# Patient Record
Sex: Male | Born: 1945 | Race: Black or African American | Hispanic: No | Marital: Married | Smoking: Never smoker
Health system: Southern US, Community
[De-identification: ages and names within clinical notes are randomized; demographics above are authoritative.]

## PROBLEM LIST (undated history)

## (undated) DIAGNOSIS — G4730 Sleep apnea, unspecified: Secondary | ICD-10-CM

## (undated) DIAGNOSIS — H409 Unspecified glaucoma: Secondary | ICD-10-CM

## (undated) DIAGNOSIS — E119 Type 2 diabetes mellitus without complications: Secondary | ICD-10-CM

## (undated) DIAGNOSIS — K219 Gastro-esophageal reflux disease without esophagitis: Secondary | ICD-10-CM

## (undated) DIAGNOSIS — R0602 Shortness of breath: Secondary | ICD-10-CM

## (undated) DIAGNOSIS — Z9989 Dependence on other enabling machines and devices: Secondary | ICD-10-CM

## (undated) DIAGNOSIS — E785 Hyperlipidemia, unspecified: Secondary | ICD-10-CM

## (undated) DIAGNOSIS — M79622 Pain in left upper arm: Secondary | ICD-10-CM

## (undated) DIAGNOSIS — T884XXA Failed or difficult intubation, initial encounter: Secondary | ICD-10-CM

## (undated) DIAGNOSIS — I10 Essential (primary) hypertension: Secondary | ICD-10-CM

## (undated) DIAGNOSIS — E669 Obesity, unspecified: Secondary | ICD-10-CM

## (undated) DIAGNOSIS — G709 Myoneural disorder, unspecified: Secondary | ICD-10-CM

## (undated) DIAGNOSIS — N529 Male erectile dysfunction, unspecified: Secondary | ICD-10-CM

## (undated) DIAGNOSIS — N189 Chronic kidney disease, unspecified: Secondary | ICD-10-CM

## (undated) DIAGNOSIS — E11319 Type 2 diabetes mellitus with unspecified diabetic retinopathy without macular edema: Secondary | ICD-10-CM

## (undated) DIAGNOSIS — G4733 Obstructive sleep apnea (adult) (pediatric): Secondary | ICD-10-CM

## (undated) DIAGNOSIS — M1990 Unspecified osteoarthritis, unspecified site: Secondary | ICD-10-CM

## (undated) DIAGNOSIS — J984 Other disorders of lung: Secondary | ICD-10-CM

## (undated) DIAGNOSIS — I509 Heart failure, unspecified: Secondary | ICD-10-CM

## (undated) DIAGNOSIS — H269 Unspecified cataract: Secondary | ICD-10-CM

## (undated) DIAGNOSIS — H903 Sensorineural hearing loss, bilateral: Secondary | ICD-10-CM

## (undated) HISTORY — DX: Unspecified glaucoma: H40.9

## (undated) HISTORY — DX: Chronic kidney disease, unspecified: N18.9

## (undated) HISTORY — DX: Unspecified cataract: H26.9

## (undated) HISTORY — DX: Essential (primary) hypertension: I10

## (undated) HISTORY — DX: Shortness of breath: R06.02

## (undated) HISTORY — DX: Sensorineural hearing loss, bilateral: H90.3

## (undated) HISTORY — DX: Sleep apnea, unspecified: G47.30

## (undated) HISTORY — DX: Type 2 diabetes mellitus without complications: E11.9

## (undated) HISTORY — DX: Pain in left upper arm: M79.622

## (undated) HISTORY — DX: Failed or difficult intubation, initial encounter: T88.4XXA

## (undated) HISTORY — PX: COLONOSCOPY: SHX174

## (undated) HISTORY — PX: GLAUCOMA SURGERY: SHX656

## (undated) HISTORY — DX: Male erectile dysfunction, unspecified: N52.9

## (undated) HISTORY — DX: Other disorders of lung: J98.4

## (undated) HISTORY — PX: JOINT REPLACEMENT: SHX530

## (undated) HISTORY — DX: Gastro-esophageal reflux disease without esophagitis: K21.9

## (undated) HISTORY — DX: Obesity, unspecified: E66.9

## (undated) HISTORY — DX: Hyperlipidemia, unspecified: E78.5

## (undated) HISTORY — DX: Type 2 diabetes mellitus with unspecified diabetic retinopathy without macular edema: E11.319

---

## 1998-11-10 ENCOUNTER — Inpatient Hospital Stay (HOSPITAL_COMMUNITY): Admission: EM | Admit: 1998-11-10 | Discharge: 1998-11-12 | Payer: Self-pay | Admitting: Emergency Medicine

## 1998-11-10 ENCOUNTER — Encounter: Payer: Self-pay | Admitting: Emergency Medicine

## 1998-11-11 ENCOUNTER — Encounter: Payer: Self-pay | Admitting: Emergency Medicine

## 1998-11-12 ENCOUNTER — Encounter: Payer: Self-pay | Admitting: Emergency Medicine

## 2000-01-05 ENCOUNTER — Encounter: Payer: Self-pay | Admitting: Emergency Medicine

## 2000-01-05 ENCOUNTER — Emergency Department (HOSPITAL_COMMUNITY): Admission: EM | Admit: 2000-01-05 | Discharge: 2000-01-05 | Payer: Self-pay | Admitting: Emergency Medicine

## 2000-03-14 ENCOUNTER — Encounter: Payer: Self-pay | Admitting: Emergency Medicine

## 2000-03-14 ENCOUNTER — Encounter: Admission: RE | Admit: 2000-03-14 | Discharge: 2000-03-14 | Payer: Self-pay | Admitting: Emergency Medicine

## 2002-01-09 ENCOUNTER — Inpatient Hospital Stay (HOSPITAL_COMMUNITY): Admission: EM | Admit: 2002-01-09 | Discharge: 2002-01-16 | Payer: Self-pay | Admitting: Emergency Medicine

## 2002-01-09 ENCOUNTER — Encounter: Payer: Self-pay | Admitting: Emergency Medicine

## 2002-01-10 ENCOUNTER — Encounter: Payer: Self-pay | Admitting: Gastroenterology

## 2002-01-10 ENCOUNTER — Encounter: Payer: Self-pay | Admitting: Emergency Medicine

## 2002-01-11 ENCOUNTER — Encounter: Payer: Self-pay | Admitting: Emergency Medicine

## 2002-01-15 ENCOUNTER — Encounter: Payer: Self-pay | Admitting: Gastroenterology

## 2002-05-23 ENCOUNTER — Encounter: Admission: RE | Admit: 2002-05-23 | Discharge: 2002-05-23 | Payer: Self-pay | Admitting: Emergency Medicine

## 2002-05-23 ENCOUNTER — Encounter: Payer: Self-pay | Admitting: Emergency Medicine

## 2002-07-17 ENCOUNTER — Ambulatory Visit (HOSPITAL_BASED_OUTPATIENT_CLINIC_OR_DEPARTMENT_OTHER): Admission: RE | Admit: 2002-07-17 | Discharge: 2002-07-17 | Payer: Self-pay | Admitting: Emergency Medicine

## 2003-02-19 ENCOUNTER — Encounter: Admission: RE | Admit: 2003-02-19 | Discharge: 2003-02-19 | Payer: Self-pay | Admitting: Emergency Medicine

## 2003-02-19 ENCOUNTER — Encounter: Payer: Self-pay | Admitting: Emergency Medicine

## 2008-03-24 ENCOUNTER — Encounter: Admission: RE | Admit: 2008-03-24 | Discharge: 2008-03-24 | Payer: Self-pay | Admitting: Family Medicine

## 2010-12-17 NOTE — H&P (Signed)
[place]. [facility]  Patient:    Micheal Morris, Micheal Morris Visit Number: [id number] MRN: [id number]          Service Type: MED Location: [id number] Attending Physician:  Roque Lias Dictated by:   Reuben Likes, M.D. Admit Date:  01/09/2002                           History and Physical  CHIEF COMPLAINT:  Nausea and vomiting.  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  Thereafter, the patient felt nauseated.  He vomited several times, clear vomitus, followed by coffee ground emesis thereafter.  There was no bright red blood in the vomitus.  This continued all night and into today. The patients wife drove him back to [place] and straight to the emergency room.  The patient also notes chest pain after vomiting and epigastric pain.  He felt chilled and feverish.  He had some sweats and was very weak.  He did not have any diarrhea or melanotic stools.  CBGs in the past week have ranged from 100-140.  The patient had a similar episode while in [place], [place], on October 12, 2001, after eating some pizza and a fish sandwich.  He was hospital at [facility] in [place], [place], and had endoscopy there which only showed a Mallory-Weiss tear.  While there he also did have a gallbladder ultrasound which was negative.  PAST SURGICAL HISTORY:  None.  PAST MEDICAL HISTORY: 1. He was hospitalized in April of 2000 with a similar episode of abdominal    pain, nausea, and vomiting and work-up at that time was negative. 2. He has had type 2 diabetes since 1997. 3. He has had hypertension. 4. He has had hyperlipidemia.  MEDICATIONS: 1. Lotensin 40 mg once a day. 2. Glucotrol XL 10 mg two per day. 3. Glucophage 850 mg t.i.d. 4. Actos 45 mg once a day. 5. HCTZ 25 mg once a day. 6. Lantus Insulin 22 units per day. 7.  Norvasc 5 mg per day. 8. Aspirin 81 mg per day.  ALLERGIES:  None.  FAMILY HISTORY:  His father died in an auto accident.  His mother died at age 47 of CVAs.  She also had diabetes and hypertension.  He has three brother, one has osteoarthritis and diabetes, and six sisters, two have diabetes.  He has two sons who are in good health.  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  He is married.  He has never used tobacco. He drinks socially.  REVIEW OF SYSTEMS:  No systemic skin, eye, ENT, respiratory, cardiovascular, GI, GU, musculoskeletal, or neurological complaints.  PHYSICAL EXAMINATION:  Blood pressure 186/92, pulse 117 and regular, respirations 20, temperature 97.9 degrees, O2 saturation 98%.  GENERAL APPEARANCE:  He is ill appearing, but in no respiratory distress.  SKIN:  Warm and dry.  Good turgor.  No rash.  HEENT:  Eyes:  Pupils small.  The fundi are not well seen.  Full EOMs.  The sclerae were nonicteric.  Ears:  The TMs were normal.  Throat:  No intraoral lesions.  The pharynx was clear.  The mucous membranes were moist.  NECK:  Supple.  No adenopathy, JVD, or bruit.  The thyroid was normal.  LUNGS:  Clear to auscultation.  HEART:  Regular rhythm.  No gallop or murmur.  ABDOMEN:  There is slight epigastric tenderness without guarding or rebound. No organomegaly or mass.  Bowel sounds were normally active.  Murphys sign and Murphys punch were negative.  RECTAL:  Stool was heme negative.  The prostate was normal.  EXTREMITIES:  No pedal edema.  The peripheral pulses were full.  NEUROLOGIC:  Alert and oriented x 3.  Speech was clear and appropriate.  No extremity weakness or tremor.  DTRs 2+ and symmetrical.  Babinskis downgoing. Cranial nerves were intact.  LABORATORY DATA:  The CBC revealed a hemoglobin of 14.1 and a white count of 12,000.  The CMET showed a sodium of 138, potassium 4.2, glucose  315, BUN 15, creatinine 1.1, SGOT 28, SGPT 22, alkaline phosphatase 108, and bilirubin 0.8. The amylase was 66 and lipase was 21.  The total CPK was 163, the MB was 2.0, and the troponin was 0.01.  ADMITTING IMPRESSION: 1. Nausea and vomiting, possibly gastroenteritis, gastritis, ulcer, or    gallstones. 2. Upper gastrointestinal bleed, probably Mallory-Weiss tear. 3. Type 2 diabetes. 4. Hypertension. 5. Hyperlipidemia.  PLAN: 1. IV fluids. 2. IV Protonix. 3. GI consult. Dictated by:   Reuben Likes, M.D. Attending Physician:  Roque Lias DD:  01/09/02 TD:  01/11/02 Job: [id number]

## 2010-12-17 NOTE — Discharge Summary (Signed)
[place]. [facility]  Patient:    Micheal Morris, Micheal Morris Visit Number: [id number] MRN: [id number]          Service Type: MED Location: [id number] Attending Physician:  Roque Lias Dictated by:   Reuben Likes, M.D. 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 Thereafter the patient felt nauseated. He vomited several times, clear vomitus, followed by coffeeground emesis thereafter. There was no bright red blood in the vomitus.  This continued all night and into today. The patients wife drove him back to [place] and straight to the emergency room. The patient also notes some chest pain after vomiting and epigastric pain. He felt chills and feverish. He had some sweats and was very weak. He did not have any diarrhea or melanotic stools. His CBGs in the last week have ranged from 100 to 140.  The patient had a similar episode while in [place], [place] on October 12, 2001, after eating pizza and a fish sandwich. He was hospitalized at [facility] in [place], [place], and had an endoscopy there which only showed a Mallory-Weiss tear. While there he also had a gallbladder ultrasound which was negative.  PHYSICAL EXAMINATION:  VITAL SIGNS:  Blood pressure 186/92, pulse 117 and regular, respiratory rate 20, temperature 97.9, oxygen saturations 98%.  GENERAL:  He appears ill and uncomfortable but in no respiratory distress.  SKIN:  Warm and dry with good turgor and no skin rash.  HEENT:  Pupils are small, the fundi were not well seen. Extraocular motions were full. Sclerae anicteric. There were no intraoral lesions. The pharynx was clear. Moist mucous membranes.  NECK:  Supple, no cervical adenopathy. The thyroid was  normal.  LUNGS:  Clear.  CARDIAC:  Rhythm was regular without gallop or murmur.  ABDOMEN:  There was slight epigastric tenderness to palpation without rebound or guarding. There was no organomegaly or masses. Normoactive bowel sounds. Muphys sign and Murphys punch were negative.  RECTUM:  Digital rectal examination revealed no masses, normal prostate, heme negative stool.  EXTREMITIES:  No pedal edema. Peripheral pulses were full.  NEUROLOGIC:  Examination within normal limits.  ADMITTING IMPRESSION: 1. Nausea and vomiting, possibly gastroenteritis, gastritis, ulcer or gallstones. 2. Upper gastrointestinal bleed, probably Mallory-Weiss tear. 3. Type 2 diabetes mellitus. 4. Hypertension. 5. Hyperlipidemia.  LABORATORY DATA:  An EKG on admission revealed normal sinus rhythm, nonspecific T-wave abnormalities. Otherwise was unremarkable, that was a followup tracing on January 11, 2002. On January 09, 2002, he had sinus tachycardia, nonspecific T-wave abnormalities and minimal ST elevation in the lateral leads.  A CT scan of the abdomen and pelvis was unremarkable. There was mild diverticular change but no diverticulitis or diverticulosis. No obvious cause for the nausea and vomiting or GI bleeding was seen. A chest x-ray showed mild cardiomegaly and an acute abdominal series revealed constipation but no obstruction or free air. An abdominal ultrasound showed no gallstones, otherwise was negative. Hepatobiliary scan was unremarkable. A gastric emptying study with normal.  HOSPITAL COURSE:  The patient was admitted to the intensive care unit and monitored by telemetry. Vital signs were obtained every 2 hours for 12 hours  and every 4 hours. He was put at bed rest and made n.p.o. except for ice chips, given IV normal saline at 250 cc an hour. His CBGs were obtained every 6 hours. He was given sliding-scale insulin and IV Protonix, morphine and Phenergan. He was seen in consultation by  Dr. Tasia Catchings.  His nausea and abdominal pain continued for the next several days. He complained of waves of nausea and periumbilical pain. He continued to vomit several times. An endoscopy showed a little mild gastritis but otherwise was negative. His blood pressure over the next several days was up and down. Phenergan failed to control his nausea and he was given ondansetron. Thereafter he felt a little bit better.  As of January 15, 2002, he was not as nauseated. He had kept 100% of his supper down and had no abdominal pain. It was the feeling of Dr. Sherin Quarry that his nausea and vomiting were due to a combination of gastritis or gastroenteritis, plus diabetic gastroparesis.  On January 16, 2002, he felt much better. He kept all of his medications down. He had no nausea or abdominal pain. His lungs were clear. His cardiac rhythm was regular without murmur. His abdomen was soft and nontender. Bowel sounds were normal. His gastric emptying studies were normal. It was felt that he could be discharged on that date.  FINAL DIAGNOSES: 1. Diabetic gastroparesis. 2. Gastritis. 3. Reflux esophagitis. 4. Hypertension. 5. Hyperlipidemia. 6. Diabetes.  DISCHARGE MEDICATIONS: 1. Metoclopramide 10 mg q.i.d., 30 minutes a.c. and q.h.s. 2. E-Mycin 333 t.i.d. 3. Omeprazole 20 mg q.d. All of these things have been started in the hospital. He was also continued on all of his other medications including: 1. Lotensin 40 mg q.d. 2. Glucotrol XL 10 mg 2 q.d. 3. Glucophage 850 mg t.i.d. which is to start later on this week. 4. Actos 45 mg q.d. 5. Hydrochlorothiazide 25 mg q.d. 6. Lantus insulin 22 units q.h.s. 7. Norvasc 5 mg q.d. 8. Aspirin 81 mg q.d.  DISCHARGE INSTRUCTIONS:  Activity, he may return to work on the following Monday. Diet, low fat diabetic diet. Other instructions, he should check blood sugars four times a day.  FOLLOWUP:  He is to see me in a week. Dictated by:   Reuben Likes, M.D. Attending Physician:  Roque Lias DD:  02/03/02  TD:  02/05/02 Job: [id number]

## 2011-02-21 ENCOUNTER — Encounter: Payer: Self-pay | Admitting: Podiatry

## 2011-02-21 DIAGNOSIS — E119 Type 2 diabetes mellitus without complications: Secondary | ICD-10-CM | POA: Insufficient documentation

## 2011-09-02 ENCOUNTER — Encounter: Payer: Self-pay | Admitting: Pulmonary Disease

## 2011-09-05 ENCOUNTER — Encounter: Payer: Self-pay | Admitting: Pulmonary Disease

## 2011-09-05 ENCOUNTER — Ambulatory Visit (INDEPENDENT_AMBULATORY_CARE_PROVIDER_SITE_OTHER): Payer: BC Managed Care – PPO | Admitting: Pulmonary Disease

## 2011-09-05 VITALS — BP 136/72 | HR 86 | Temp 97.9°F | Ht 71.0 in | Wt 261.6 lb

## 2011-09-05 DIAGNOSIS — G4733 Obstructive sleep apnea (adult) (pediatric): Secondary | ICD-10-CM | POA: Insufficient documentation

## 2011-09-05 NOTE — Patient Instructions (Signed)
Will get you a new cpap machine and mask, and take this opportunity to re-optimize your pressure.  Will call you once I get your download, and let you know your optimal pressure. Work on weight loss If doing well, followup with me in 6mos, then if doing well, will see you yearly .

## 2011-09-05 NOTE — Assessment & Plan Note (Signed)
The patient has a history of sleep apnea, and has been on CPAP for many years.  Unfortunately he has an aged machine and CPAP mask, and from his history is having breakthrough events.  This is leading to non-restorative sleep and significant daytime sleepiness.  At this point, we will need to get him a new CPAP machine and mask, and optimize his pressure again on the automatic setting for the first few weeks.  I have also encouraged him to work aggressively on weight loss.

## 2011-09-05 NOTE — Progress Notes (Signed)
  Subjective:    Patient ID: Micheal Morris, male    DOB: 08-06-1945, 66 y.o.   MRN: [id number]  HPI The patient is a 66 year old male who I have been asked to see for management of obstructive sleep apnea.  He was apparently diagnosed with severe sleep apnea many years ago, and was seen in this practice.  He has been lost to followup, but has continued to wear CPAP on a compliant basis.  Unfortunately, his machine is very old, and he feels the pressure is not consistent on a night to night basis.  He also has a mask that is 72-23 years old and has been leaking quite a bit.  His bed partner has her breakthrough snoring, but he denies choking arousals.  Most mornings he is fairly rested, but he clearly has daytime sleep pressure.  He also falls asleep in the evenings while watching television.  He also admits to sleep pressure with driving that can sometimes be significant.  The patient states that his weight is up about 10 pounds over the last 5 years, and his Epworth sleepiness score today is 15.  Sleep Questionnaire: What time do you typically go to bed?( Between what hours) 10pm-1 am How long does it take you to fall asleep? 2 to 3 mins How many times during the night do you wake up? 2 What time do you get out of bed to start your day? 0600 Do you drive or operate heavy machinery in your occupation? No How much has your weight changed (up or down) over the past two years? (In pounds) 2 lb (0.907 kg) Have you ever had a sleep study before? Yes If yes, location of study? [facility] If yes, date of study? 5-10 years ago Do you currently use CPAP? Yes If so, what pressure? Do you wear oxygen at any time? No   Review of Systems  Constitutional: Negative for fever and unexpected weight change.  HENT: Negative for ear pain, nosebleeds, congestion, sore throat, rhinorrhea, sneezing, trouble swallowing, dental problem, postnasal drip and sinus pressure.   Eyes: Negative for redness and itching.  Respiratory:  Negative for cough, chest tightness, shortness of breath and wheezing.   Cardiovascular: Negative for palpitations and leg swelling.  Gastrointestinal: Negative for nausea and vomiting.  Genitourinary: Negative for dysuria.  Musculoskeletal: Negative for joint swelling.  Skin: Negative for rash.  Neurological: Negative for headaches.  Hematological: Does not bruise/bleed easily.  Psychiatric/Behavioral: Negative for dysphoric mood. The patient is not nervous/anxious.        Objective:   Physical Exam Constitutional:  Overweight male, no acute distress  HENT:  Nares patent without discharge  Oropharynx without exudate, palate and uvula are thick and elongated.   Eyes:  Perrla, eomi, no scleral icterus  Neck:  No JVD, no TMG  Cardiovascular:  Normal rate, regular rhythm, no rubs or gallops.  2/6 sem        Intact distal pulses  Pulmonary :  Normal breath sounds, no stridor or respiratory distress   No rales, rhonchi, or wheezing  Abdominal:  Soft, nondistended, bowel sounds present.  No tenderness noted.   Musculoskeletal: mild lower extremity edema noted.  Lymph Nodes:  No cervical lymphadenopathy noted  Skin:  No cyanosis noted  Neurologic:  Alert, appropriate, moves all 4 extremities without obvious deficit.         Assessment & Plan:

## 2011-10-10 DIAGNOSIS — K219 Gastro-esophageal reflux disease without esophagitis: Secondary | ICD-10-CM | POA: Insufficient documentation

## 2011-11-20 ENCOUNTER — Other Ambulatory Visit: Payer: Self-pay | Admitting: Pulmonary Disease

## 2011-11-20 DIAGNOSIS — G4733 Obstructive sleep apnea (adult) (pediatric): Secondary | ICD-10-CM

## 2012-03-05 ENCOUNTER — Encounter: Payer: Self-pay | Admitting: Pulmonary Disease

## 2012-03-05 ENCOUNTER — Ambulatory Visit (INDEPENDENT_AMBULATORY_CARE_PROVIDER_SITE_OTHER): Payer: BC Managed Care – PPO | Admitting: Pulmonary Disease

## 2012-03-05 VITALS — BP 142/88 | HR 90 | Temp 97.9°F | Ht 71.0 in | Wt 258.4 lb

## 2012-03-05 DIAGNOSIS — G4733 Obstructive sleep apnea (adult) (pediatric): Secondary | ICD-10-CM

## 2012-03-05 NOTE — Progress Notes (Signed)
  Subjective:    Patient ID: Micheal Morris, male    DOB: 1946/03/29, 66 y.o.   MRN: [id number]  HPI The patient comes in today for all up of his known obstructive sleep apnea.  At the last visit, we got him a new CPAP machine and mask, and also optimized his pressure to 12 cm of water.  The patient has been wearing CPAP compliance, and notes no issues with mask fit or pressure currently.  He feels that he sleeps well with the device, and has adequate daytime alertness.   Review of Systems  Constitutional: Negative.  Negative for fever and unexpected weight change.  HENT: Positive for rhinorrhea and sinus pressure. Negative for ear pain, nosebleeds, congestion, sore throat, sneezing, trouble swallowing, dental problem and postnasal drip.   Eyes: Negative.  Negative for redness and itching.  Respiratory: Negative.  Negative for cough, chest tightness, shortness of breath and wheezing.   Cardiovascular: Positive for leg swelling. Negative for palpitations.  Gastrointestinal: Negative for nausea and vomiting.  Genitourinary: Negative.  Negative for dysuria.  Musculoskeletal: Positive for joint swelling.  Skin: Negative.  Negative for rash.  Neurological: Negative.  Negative for headaches.  Hematological: Negative.  Does not bruise/bleed easily.  Psychiatric/Behavioral: Negative.  Negative for dysphoric mood. The patient is not nervous/anxious.        Objective:   Physical Exam Overweight male in no acute distress Nose without purulent discharge noted No skin breakdown or pressure necrosis from the CPAP mask Lower extremities with 1+ ankle edema, no cyanosis Alert and oriented, does not appear to be sleepy, moves all 4 extremities.       Assessment & Plan:

## 2012-03-05 NOTE — Patient Instructions (Addendum)
Continue on cpap, and keep up with mask changes and supplies. Work on weight loss. followup with me in one year if doing well.  

## 2012-03-05 NOTE — Assessment & Plan Note (Signed)
The patient seems to be doing well on his current CPAP with optimal pressure.  He is having no issues with his mask fit or pressure.  He feels that it is helping him sleep and daytime alertness.  I have asked him to work aggressively on weight loss, and also to keep up with his supplies.

## 2012-09-18 DIAGNOSIS — R748 Abnormal levels of other serum enzymes: Secondary | ICD-10-CM | POA: Insufficient documentation

## 2013-02-07 ENCOUNTER — Telehealth: Payer: Self-pay | Admitting: Pulmonary Disease

## 2013-02-07 DIAGNOSIS — G4733 Obstructive sleep apnea (adult) (pediatric): Secondary | ICD-10-CM

## 2013-02-07 NOTE — Telephone Encounter (Signed)
I sent the order to [facility] for neb supplies  LMTCB

## 2013-02-08 NOTE — Telephone Encounter (Signed)
Spoke with patient made him aware orders have been placed and he would be hearing from [facility] Nothing further needed at this time

## 2013-03-19 ENCOUNTER — Ambulatory Visit (INDEPENDENT_AMBULATORY_CARE_PROVIDER_SITE_OTHER): Payer: Medicare Other | Admitting: Pulmonary Disease

## 2013-03-19 ENCOUNTER — Encounter: Payer: Self-pay | Admitting: Pulmonary Disease

## 2013-03-19 VITALS — BP 132/70 | HR 74 | Temp 97.5°F | Ht 70.0 in | Wt 263.2 lb

## 2013-03-19 DIAGNOSIS — G4733 Obstructive sleep apnea (adult) (pediatric): Secondary | ICD-10-CM

## 2013-03-19 NOTE — Assessment & Plan Note (Signed)
The pt is doing very well with cpap, and feels it has made a big improvement in his sleep and daytime symptoms.  He is having no issues with the pressure or mask.  I have asked him to work aggressively on weight loss, and to keep up with supplies.  I will see him back in one year if doing well.

## 2013-03-19 NOTE — Progress Notes (Signed)
  Subjective:    Patient ID: Micheal Morris, male    DOB: [birth year], 67 y.o.   MRN: [id number]  HPI Patient comes in today for followup of his obstructive sleep apnea.  He is wearing CPAP compliantly, and is having no issues with his mask fit or pressure.  He feels that he sleeps well with the device, and has had great improvement in his daytime alertness since being on CPAP.  It should be noted that his weight is up about 4-5 pounds over the last one year.   Review of Systems  Constitutional: Negative for fever and unexpected weight change.  HENT: Negative for ear pain, nosebleeds, congestion, sore throat, rhinorrhea, sneezing, trouble swallowing, dental problem, postnasal drip and sinus pressure.   Eyes: Negative for redness and itching.  Respiratory: Negative for cough, chest tightness, shortness of breath and wheezing.   Cardiovascular: Negative for palpitations and leg swelling.  Gastrointestinal: Negative for nausea and vomiting.  Genitourinary: Negative for dysuria.  Musculoskeletal: Negative for joint swelling.  Skin: Negative for rash.  Neurological: Negative for headaches.  Hematological: Does not bruise/bleed easily.  Psychiatric/Behavioral: Negative for dysphoric mood. The patient is not nervous/anxious.        Objective:   Physical Exam Overweight male in no acute distress Nose without purulence or discharge noted No skin breakdown or pressure necrosis from the CPAP mask Neck without lymphadenopathy or thyromegaly Lower extremities without edema, no cyanosis Alert and oriented, moves all 4 extremities.       Assessment & Plan:

## 2013-03-19 NOTE — Patient Instructions (Addendum)
Continue with cpap, and keep up with mask changes and supplies. Work on weight loss followup with me in one year if doing well.  

## 2013-06-13 ENCOUNTER — Ambulatory Visit (INDEPENDENT_AMBULATORY_CARE_PROVIDER_SITE_OTHER): Payer: Medicare Other | Admitting: Podiatry

## 2013-06-13 ENCOUNTER — Encounter: Payer: Self-pay | Admitting: Podiatry

## 2013-06-13 VITALS — BP 152/72 | HR 87 | Resp 12 | Ht 70.0 in | Wt 258.0 lb

## 2013-06-13 DIAGNOSIS — M79609 Pain in unspecified limb: Secondary | ICD-10-CM

## 2013-06-13 DIAGNOSIS — B351 Tinea unguium: Secondary | ICD-10-CM

## 2013-06-16 NOTE — Progress Notes (Signed)
Subjective:     Patient ID: Micheal Morris, male   DOB: 05-Jun-1946, 67 y.o.   MRN: [id number]  HPI patient presents with incurvated nail bed 1-5 both feet that he cannot take care of himself and they become painful as they grow longer   Review of Systems     Objective:   Physical Exam Neurovascular status intact. Nail disease with thickness and pain 1-5 both feet    Assessment:     Mycotic nail infection with pain 1-5 both feet    Plan:     Debridement of painful nail bed 1-5 both feet

## 2013-08-15 ENCOUNTER — Ambulatory Visit: Payer: Medicare Other | Admitting: Podiatry

## 2013-08-28 ENCOUNTER — Ambulatory Visit: Payer: Medicare Other | Admitting: Podiatry

## 2013-08-28 ENCOUNTER — Ambulatory Visit (INDEPENDENT_AMBULATORY_CARE_PROVIDER_SITE_OTHER): Payer: Medicare Other | Admitting: Podiatrist

## 2013-08-28 ENCOUNTER — Encounter: Payer: Self-pay | Admitting: Podiatrist

## 2013-08-28 VITALS — BP 140/64 | HR 100 | Resp 16

## 2013-08-28 DIAGNOSIS — M79609 Pain in unspecified limb: Secondary | ICD-10-CM

## 2013-08-28 DIAGNOSIS — B351 Tinea unguium: Secondary | ICD-10-CM

## 2013-08-28 NOTE — Patient Instructions (Signed)
Diabetes and Foot Care Diabetes may cause you to have problems because of poor blood supply (circulation) to your feet and legs. This may cause the skin on your feet to become thinner, break easier, and heal more slowly. Your skin may become dry, and the skin may peel and crack. You may also have nerve damage in your legs and feet causing decreased feeling in them. You may not notice minor injuries to your feet that could lead to infections or more serious problems. Taking care of your feet is one of the most important things you can do for yourself.  HOME CARE INSTRUCTIONS  Wear shoes at all times, even in the house. Do not go barefoot. Bare feet are easily injured.  Check your feet daily for blisters, cuts, and redness. If you cannot see the bottom of your feet, use a mirror or ask someone for help.  Wash your feet with warm water (do not use hot water) and mild soap. Then pat your feet and the areas between your toes until they are completely dry. Do not soak your feet as this can dry your skin.  Apply a moisturizing lotion or petroleum jelly (that does not contain alcohol and is unscented) to the skin on your feet and to dry, brittle toenails. Do not apply lotion between your toes.  Trim your toenails straight across. Do not dig under them or around the cuticle. File the edges of your nails with an emery board or nail file.  Do not cut corns or calluses or try to remove them with medicine.  Wear clean socks or stockings every day. Make sure they are not too tight. Do not wear knee-high stockings since they may decrease blood flow to your legs.  Wear shoes that fit properly and have enough cushioning. To break in new shoes, wear them for just a few hours a day. This prevents you from injuring your feet. Always look in your shoes before you put them on to be sure there are no objects inside.  Do not cross your legs. This may decrease the blood flow to your feet.  If you find a minor scrape,  cut, or break in the skin on your feet, keep it and the skin around it clean and dry. These areas may be cleansed with mild soap and water. Do not cleanse the area with peroxide, alcohol, or iodine.  When you remove an adhesive bandage, be sure not to damage the skin around it.  If you have a wound, look at it several times a day to make sure it is healing.  Do not use heating pads or hot water bottles. They may burn your skin. If you have lost feeling in your feet or legs, you may not know it is happening until it is too late.  Make sure your health care provider performs a complete foot exam at least annually or more often if you have foot problems. Report any cuts, sores, or bruises to your health care provider immediately. SEEK MEDICAL CARE IF:   You have an injury that is not healing.  You have cuts or breaks in the skin.  You have an ingrown nail.  You notice redness on your legs or feet.  You feel burning or tingling in your legs or feet.  You have pain or cramps in your legs and feet.  Your legs or feet are numb.  Your feet always feel cold. SEEK IMMEDIATE MEDICAL CARE IF:   There is increasing redness,   swelling, or pain in or around a wound.  There is a red line that goes up your leg.  Pus is coming from a wound.  You develop a fever or as directed by your health care provider.  You notice a bad smell coming from an ulcer or wound. Document Released: 07/15/2000 Document Revised: 03/20/2013 Document Reviewed: 12/25/2012 Exit[employer] Patient Information 2014 [employer].  

## 2013-08-29 NOTE — Progress Notes (Signed)
HPI:  Patient presents today for follow up of foot and nail care. Denies any new complaints today.  Objective:  Patients chart is reviewed.  Neurovascular status unchanged.  Patients nails are thickened, discolored, distrophic, friable and brittle with yellow-brown discoloration. Patient subjectively relates they are painful with shoes and with ambulation of bilateral feet.  Assessment:  Symptomatic onychomycosis  Plan:  Discussed treatment options and alternatives.  The symptomatic toenails were debrided through manual an mechanical means without complication.  Return appointment recommended at routine intervals of 3 months    Abraham Margulies, DPM   

## 2013-09-02 ENCOUNTER — Ambulatory Visit: Payer: Medicare Other | Admitting: Podiatry

## 2013-10-23 ENCOUNTER — Encounter: Payer: Self-pay | Admitting: Podiatry

## 2013-10-23 ENCOUNTER — Ambulatory Visit (INDEPENDENT_AMBULATORY_CARE_PROVIDER_SITE_OTHER): Payer: Medicare Other | Admitting: Podiatry

## 2013-10-23 VITALS — BP 128/75 | HR 99 | Resp 19 | Ht 70.0 in | Wt 266.0 lb

## 2013-10-23 DIAGNOSIS — M79609 Pain in unspecified limb: Secondary | ICD-10-CM

## 2013-10-23 DIAGNOSIS — B351 Tinea unguium: Secondary | ICD-10-CM

## 2013-10-24 NOTE — Progress Notes (Signed)
Subjective:     Patient ID: Micheal Morris, male   DOB: [birth year], 68 y.o.   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

## 2013-11-06 ENCOUNTER — Ambulatory Visit: Payer: Medicare Other | Admitting: Podiatry

## 2014-01-06 ENCOUNTER — Ambulatory Visit (INDEPENDENT_AMBULATORY_CARE_PROVIDER_SITE_OTHER): Payer: Medicare Other | Admitting: Podiatry

## 2014-01-06 ENCOUNTER — Ambulatory Visit: Payer: Medicare Other | Admitting: Podiatry

## 2014-01-06 DIAGNOSIS — B351 Tinea unguium: Secondary | ICD-10-CM

## 2014-01-06 DIAGNOSIS — M79609 Pain in unspecified limb: Secondary | ICD-10-CM

## 2014-01-06 NOTE — Progress Notes (Signed)
   Subjective:    Patient ID: Micheal Morris, male    DOB: [birth year], 68 y.o.   MRN: [id number]  HPI Debride no other issues at this time   Review of Systems     Objective:   Physical Exam        Assessment & Plan:

## 2014-01-07 NOTE — Progress Notes (Signed)
Subjective:     Patient ID: Micheal Morris, male   DOB: [birth year], 68 y.o.   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

## 2014-03-14 ENCOUNTER — Other Ambulatory Visit: Payer: Self-pay | Admitting: Internal Medicine

## 2014-03-14 DIAGNOSIS — G629 Polyneuropathy, unspecified: Secondary | ICD-10-CM

## 2014-03-14 DIAGNOSIS — E1149 Type 2 diabetes mellitus with other diabetic neurological complication: Secondary | ICD-10-CM

## 2014-03-19 ENCOUNTER — Encounter: Payer: Self-pay | Admitting: Pulmonary Disease

## 2014-03-19 ENCOUNTER — Ambulatory Visit (INDEPENDENT_AMBULATORY_CARE_PROVIDER_SITE_OTHER): Payer: Medicare Other | Admitting: Pulmonary Disease

## 2014-03-19 VITALS — BP 108/62 | HR 94 | Temp 98.0°F | Ht 70.0 in | Wt 261.6 lb

## 2014-03-19 DIAGNOSIS — G4733 Obstructive sleep apnea (adult) (pediatric): Secondary | ICD-10-CM

## 2014-03-19 NOTE — Progress Notes (Signed)
   Subjective:    Patient ID: Micheal Morris, male    DOB: [birth year], 68 y.o.   MRN: [id number]  HPI The patient comes in today for followup of his obstructive sleep apnea.  He is wearing CPAP compliantly, and is having no issues with his mask fit. He will occasionally feel that his pressure is excessive, but he has not had any significant change in his weight. He feels that he sleeps well with the device, and is satisfied with his daytime alertness.   Review of Systems  Constitutional: Negative for fever and unexpected weight change.  HENT: Negative for congestion, dental problem, ear pain, nosebleeds, postnasal drip, rhinorrhea, sinus pressure, sneezing, sore throat and trouble swallowing.   Eyes: Negative for redness and itching.  Respiratory: Negative for cough, chest tightness, shortness of breath and wheezing.   Cardiovascular: Negative for palpitations and leg swelling.  Gastrointestinal: Negative for nausea and vomiting.  Genitourinary: Negative for dysuria.  Musculoskeletal: Negative for joint swelling.  Skin: Negative for rash.  Neurological: Negative for headaches.  Hematological: Does not bruise/bleed easily.  Psychiatric/Behavioral: Negative for dysphoric mood. The patient is not nervous/anxious.        Objective:   Physical Exam Obese male in no acute distress Nose without purulence or discharge noted Neck without lymphadenopathy or thyromegaly No skin breakdown or pressure necrosis from the CPAP Lower extremities with mild edema, no cyanosis Alert and oriented, moves all 4 extremities.       Assessment & Plan:

## 2014-03-19 NOTE — Assessment & Plan Note (Signed)
The patient comes in today for followup of his obstructive sleep apnea. He is wearing CPAP compliantly, and is satisfied with his sleep and daytime alertness. He does sometimes feel that his pressure is excessive, and I have offered to try him on the auto adjusting mode. He would like to stay on his current setting but will call if he has issues. I've also encouraged him to work aggressively on weight loss.

## 2014-03-19 NOTE — Patient Instructions (Signed)
Continue on cpap, and let us know if you would prefer to be on the auto adjusting pressure. Keep up with mask changes and supplies. Work on weight loss followup with me again in one year.

## 2014-03-20 ENCOUNTER — Ambulatory Visit
Admission: RE | Admit: 2014-03-20 | Discharge: 2014-03-20 | Disposition: A | Discharge status: Home / Self Care | Payer: Medicare Other | Source: Ambulatory Visit | Attending: Internal Medicine | Admitting: Internal Medicine

## 2014-03-20 DIAGNOSIS — G629 Polyneuropathy, unspecified: Secondary | ICD-10-CM

## 2014-03-20 DIAGNOSIS — E1149 Type 2 diabetes mellitus with other diabetic neurological complication: Secondary | ICD-10-CM

## 2014-04-14 ENCOUNTER — Ambulatory Visit (INDEPENDENT_AMBULATORY_CARE_PROVIDER_SITE_OTHER): Payer: Medicare Other | Admitting: Podiatry

## 2014-04-14 DIAGNOSIS — B351 Tinea unguium: Secondary | ICD-10-CM

## 2014-04-14 DIAGNOSIS — M79609 Pain in unspecified limb: Secondary | ICD-10-CM

## 2014-04-14 DIAGNOSIS — M79673 Pain in unspecified foot: Secondary | ICD-10-CM

## 2014-04-14 NOTE — Progress Notes (Signed)
   Subjective:    Patient ID: Micheal Morris, male    DOB: [birth year], 68 y.o.   MRN: [id number]  HPI  Pt presents for nail debridement Review of Systems     Objective:   Physical Exam        Assessment & Plan:

## 2014-04-15 NOTE — Progress Notes (Signed)
Subjective:     Patient ID: Micheal Morris, male   DOB: 03/18/1946, 68 y.o.   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      

## 2014-06-30 ENCOUNTER — Ambulatory Visit (INDEPENDENT_AMBULATORY_CARE_PROVIDER_SITE_OTHER): Payer: Medicare Other | Admitting: Podiatry

## 2014-06-30 DIAGNOSIS — B351 Tinea unguium: Secondary | ICD-10-CM

## 2014-06-30 DIAGNOSIS — M79673 Pain in unspecified foot: Secondary | ICD-10-CM

## 2014-06-30 NOTE — Progress Notes (Signed)
Subjective:     Patient ID: Micheal Morris, male   DOB: 01/15/1946, 68 y.o.   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      

## 2014-09-08 ENCOUNTER — Ambulatory Visit: Payer: Medicare Other | Admitting: Podiatry

## 2014-09-08 ENCOUNTER — Ambulatory Visit (INDEPENDENT_AMBULATORY_CARE_PROVIDER_SITE_OTHER): Payer: Medicare Other | Admitting: Podiatry

## 2014-09-08 DIAGNOSIS — M79673 Pain in unspecified foot: Secondary | ICD-10-CM

## 2014-09-08 DIAGNOSIS — B351 Tinea unguium: Secondary | ICD-10-CM

## 2014-09-08 NOTE — Progress Notes (Signed)
Subjective:     Patient ID: Micheal Morris, male   DOB: [birth year], 69 y.o.   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

## 2014-09-22 ENCOUNTER — Other Ambulatory Visit: Payer: Self-pay | Admitting: Internal Medicine

## 2014-09-22 ENCOUNTER — Ambulatory Visit
Admission: RE | Admit: 2014-09-22 | Discharge: 2014-09-22 | Disposition: A | Discharge status: Home / Self Care | Payer: Medicare Other | Source: Ambulatory Visit | Attending: Internal Medicine | Admitting: Internal Medicine

## 2014-09-22 DIAGNOSIS — R0602 Shortness of breath: Secondary | ICD-10-CM

## 2014-10-13 ENCOUNTER — Encounter: Payer: Self-pay | Admitting: *Deleted

## 2014-10-13 ENCOUNTER — Encounter: Payer: Self-pay | Admitting: Internal Medicine

## 2014-10-13 ENCOUNTER — Ambulatory Visit (INDEPENDENT_AMBULATORY_CARE_PROVIDER_SITE_OTHER): Payer: Medicare Other | Admitting: Internal Medicine

## 2014-10-13 VITALS — BP 118/76 | HR 99 | Ht 70.0 in | Wt 272.4 lb

## 2014-10-13 DIAGNOSIS — R0602 Shortness of breath: Secondary | ICD-10-CM

## 2014-10-13 NOTE — Progress Notes (Signed)
Cardiology Office Note   Date:  10/13/2014   ID:  KYCE GING, DOB [birth year], MRN [id number]  PCP:  Chesley Noon, MD  Cardiologist:   Dorris Carnes, MD   No chief complaint on file.  Pateitn presents for evaluation of SOB and L arm discomfort     History of Present Illness: Micheal Morris is a 69 y.o. male who was seen recently in medicine clinic by Dr Buddy Duty.  [name] he was a little more SOB with activites  Sometimes associated with L arm pain.    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 amLODipine-benazepril (LOTREL) 10-20 MG per capsule TAKE 1 CAPSULE BY MOUTH DAILY    . BD PEN NEEDLE NANO U/F 32G X 4 MM MISC Use as directed with insulin  4  . Canagliflozin (INVOKANA) 100 MG TABS Take 1 tablet by mouth daily.    . fesoterodine (TOVIAZ) 4 MG TB24 tablet Take 4 mg by mouth daily.    . fish oil-omega-3 fatty acids 1000 MG capsule Take 2 capsules by mouth daily.     . Flaxseed, Linseed, (FLAX SEED OIL PO) Take 1 tablet by mouth daily.     . fluticasone (FLONASE) 50 MCG/ACT nasal spray Place 2 sprays into the nose daily as needed.     . gabapentin (NEURONTIN) 300 MG capsule Take 300 mg by mouth 2 (two) times daily.     . Glucosamine-Chondroitin 750-600 MG TABS Take 1 tablet by mouth 2 (two) times daily.    . insulin glargine (LANTUS) 100 UNIT/ML injection Inject 70 Units into the skin daily. 70 UNITS PER DAY    . insulin lispro (HUMALOG) 100 UNIT/ML injection Inject 32 Units into the skin.     . Lancets (ONETOUCH ULTRASOFT) lancets Use 1 lancets three (3) times a day with insulin  11  .  Liraglutide (VICTOZA) 18 MG/3ML SOPN Inject 18 Units as directed daily.    Marland Kitchen LUMIGAN 0.01 % SOLN Place 1 drop into both eyes at bedtime.  3  . metoprolol succinate (TOPROL-XL) 100 MG 24 hr tablet TAKE 1 TABLET BY MOUTH ONCE DAILY    . Multiple Vitamins-Minerals (MULTIVITAMIN WITH MINERALS) tablet Take 1 tablet by mouth daily.    . Probiotic Product (PROBIOTIC & ACIDOPHILUS EX ST PO) Take 1 tablet by mouth daily.     . simvastatin (ZOCOR) 20 MG tablet Take 20 mg by mouth every evening.    . simvastatin (ZOCOR) 20 MG tablet TAKE 1 TABLET BY MOUTH EVERY DAY AT BEDTIME    . spironolactone (ALDACTONE) 25 MG tablet TAKE 1 TABLET BY MOUTH EVERY DAY    . torsemide (DEMADEX) 20 MG tablet TAKE 1 TABLET EVERY DAY    . vardenafil (LEVITRA) 20 MG tablet Take 20 mg by mouth daily as needed for erectile dysfunction.      No current facility-administered medications for this visit.    Allergies:   Nsaids and Aspirin  Past Medical History  Diagnosis Date  . Erectile dysfunction   . OSA (obstructive sleep apnea)   . HTN (hypertension)   . Hyperlipidemia   . Type II diabetes mellitus   . Chronic kidney disease   . Sensory hearing loss, bilateral   . Restrictive lung disease   . Obesity   . SOBOE (shortness of breath on exertion)   . Pain of left upper arm     Past Surgical History  Procedure Laterality Date  . Colonoscopy       Social History:  The patient  reports that he quit smoking about 40 years ago. His smoking use included Cigarettes. He has a 7 pack-year smoking history. He has never used smokeless tobacco. He reports that he does not drink alcohol or use illicit drugs.   Family History:  The patient's family history includes Diabetes in his brother, mother, and sister; Hypertension in his brother and mother; Stroke in his mother.    ROS:  Please see the history of present illness. All other systems are reviewed and  Negative to the above problem except as noted.    PHYSICAL  EXAM: VS:  BP 118/76 mmHg  Pulse 99  Ht 5\' 10"  (1.778 m)  Wt 272 lb 6.4 oz (123.56 kg)  BMI 39.09 kg/m2  GEN: Well nourished, well developed, in no acute distress HEENT: normal Neck: no JVD, carotid bruits, or masses Cardiac: RRR; no murmurs, rubs, or gallops,no edema  Respiratory:  clear to auscultation bilaterally, normal work of breathing GI: soft, nontender, nondistended, + BS  No hepatomegaly  MS: no deformity Moving all extremities   Skin: warm and dry, no rash Neuro:  Strength and sensation are intact Psych: euthymic mood, full affect   EKG:  EKG is ordered today.  NSR 99 bpm     Lipid Panel No results found for: CHOL, TRIG, HDL, CHOLHDL, VLDL, LDLCALC, LDLDIRECT    Wt Readings from Last 3 Encounters:  10/13/14 272 lb 6.4 oz (123.56 kg)  03/19/14 261 lb 9.6 oz (118.661 kg)  10/23/13 266 lb (120.657 kg)      ASSESSMENT AND PLAN:  1.  Dyspnea  Concerning, esp with history of DM   Would set up for echo and lexiscan myoview    2  L arm dyscomfort  Tests as noted    3.  HL  Will need to check on lipid  Patinet is on statin.    Current medicines are reviewed at length with the patient today.  The patient does not have concerns regarding medicines.  The following changes have been made:   Labs/ tests ordered today include:  Orders Placed This Encounter  Procedures  . Myocardial Perfusion Imaging  . EKG 12-Lead  . 2D Echocardiogram with contrast     Disposition:   FU depends on stress results    Signed, Dorris Carnes, MD  10/13/2014 10:01 PM    [facility] [place], [place],   [address] Phone: [phone]; Fax: [phone]

## 2014-10-13 NOTE — Patient Instructions (Addendum)
Your physician has requested that you have an echocardiogram. Echocardiography is a painless test that uses sound waves to create images of your heart. It provides your doctor with information about the size and shape of your heart and how well your heart's chambers and valves are working. This procedure takes approximately one hour. There are no restrictions for this procedure.   Your physician has requested that you have a lexiscan myoview. For further information please visit [email or web address]. Please follow instruction sheet, as given.   

## 2014-10-14 ENCOUNTER — Other Ambulatory Visit (HOSPITAL_COMMUNITY): Payer: Medicare Other | Admitting: *Deleted

## 2014-10-14 ENCOUNTER — Ambulatory Visit (HOSPITAL_COMMUNITY): Payer: Medicare Other | Attending: Internal Medicine | Admitting: Radiology

## 2014-10-14 DIAGNOSIS — R0600 Dyspnea, unspecified: Secondary | ICD-10-CM

## 2014-10-14 DIAGNOSIS — R0602 Shortness of breath: Secondary | ICD-10-CM | POA: Diagnosis present

## 2014-10-14 MED ORDER — PERFLUTREN LIPID MICROSPHERE
1.0000 mL | INTRAVENOUS | Status: AC | PRN
  Administered 2014-10-14: 4 mL via INTRAVENOUS

## 2014-10-14 NOTE — Progress Notes (Signed)
Echocardiogram performed with Definity.  

## 2014-10-22 ENCOUNTER — Ambulatory Visit (HOSPITAL_COMMUNITY): Payer: Medicare Other | Attending: Cardiology | Admitting: Radiology

## 2014-10-22 DIAGNOSIS — R079 Chest pain, unspecified: Secondary | ICD-10-CM | POA: Insufficient documentation

## 2014-10-22 DIAGNOSIS — E119 Type 2 diabetes mellitus without complications: Secondary | ICD-10-CM | POA: Diagnosis not present

## 2014-10-22 DIAGNOSIS — I10 Essential (primary) hypertension: Secondary | ICD-10-CM | POA: Diagnosis not present

## 2014-10-22 DIAGNOSIS — R0602 Shortness of breath: Secondary | ICD-10-CM | POA: Insufficient documentation

## 2014-10-22 DIAGNOSIS — R001 Bradycardia, unspecified: Secondary | ICD-10-CM | POA: Insufficient documentation

## 2014-10-22 MED ORDER — REGADENOSON 0.4 MG/5ML IV SOLN
0.4000 mg | Freq: Once | INTRAVENOUS | Status: AC
  Administered 2014-10-22: 0.4 mg via INTRAVENOUS

## 2014-10-22 MED ORDER — TECHNETIUM TC 99M SESTAMIBI GENERIC - CARDIOLITE
11.0000 | Freq: Once | INTRAVENOUS | Status: AC | PRN
  Administered 2014-10-22: 11 via INTRAVENOUS

## 2014-10-22 MED ORDER — TECHNETIUM TC 99M SESTAMIBI GENERIC - CARDIOLITE
33.0000 | Freq: Once | INTRAVENOUS | Status: AC | PRN
  Administered 2014-10-22: 33 via INTRAVENOUS

## 2014-10-22 NOTE — Progress Notes (Signed)
[facility] [address] [place], [place] [address] [phone]    Cardiology Nuclear Med Study  Micheal Morris is a 69 y.o. male     MRN : [id number]     DOB: 1945/10/09  Procedure Date: 10/22/2014  Nuclear Med Background Indication for Stress Test:  Evaluation for Ischemia History:  MPI: 5 yrs ago Cardiac Risk Factors: Hypertension and IDDM  Symptoms:  Chest Pain and SOB   Nuclear Pre-Procedure Caffeine/Decaff Intake:  7:00pm NPO After: 8:00pm   Lungs:  clear O2 Sat: 96% on room air. IV 0.9% NS with Angio Cath:  22g  IV Site: R Hand  IV Started by:  Matilde Haymaker, RN  Chest Size (in):  52 Cup Size: n/a  Height: 5\' 10"  (1.778 m)  Weight:  271 lb (122.925 kg)  BMI:  Body mass index is 38.88 kg/(m^2). Tech Comments:  No Toprol x 24 hrs.  This patient went into [facility] as soon as he went into recovery from the [place] injection.    Nuclear Med Study 1 or 2 day study: 1 day  Stress Test Type:  Carlton Adam  Reading MD: n/a  Order Authorizing Provider:  Nevin Bloodgood Ross,MD  Resting Radionuclide: Technetium 39m Sestamibi  Resting Radionuclide Dose: 11.0 mCi   Stress Radionuclide:  Technetium 64m Sestamibi  Stress Radionuclide Dose: 33.0 mCi           Stress Protocol Rest HR: 86 Stress HR: 88  Rest BP: 118/67 Stress BP: 124/68  Exercise Time (min): n/a METS: n/a   Predicted Max HR: 153 bpm % Max HR: 57.52 bpm Rate Pressure Product: 10912   Dose of Adenosine (mg):  n/a Dose of Lexiscan: 0.4 mg  Dose of Atropine (mg): n/a Dose of Dobutamine: n/a mcg/kg/min (at max HR)  Stress Test Technologist: Perrin Maltese, EMT-P  Nuclear Technologist:  Earl Many, CNMT     Rest Procedure:  Myocardial perfusion imaging was performed at rest 45 minutes following the intravenous administration of Technetium 59m Sestamibi. Rest ECG: NSR with non-specific ST-T wave changes  Stress Procedure:  The patient received IV Lexiscan 0.4 mg over 15-seconds.   Technetium 41m Sestamibi injected at 30-seconds. This patient was sob and lt. Headed with the Lexiscan injection.  Quantitative spect images were obtained after a 45 minute delay. Stress ECG: Significant bradycardia noted during infusion., This promptly resolved within 1 minute.  QPS Raw Data Images:  Normal; no motion artifact; normal heart/lung ratio. Stress Images:  Normal homogeneous uptake in all areas of the myocardium. Rest Images:  Normal homogeneous uptake in all areas of the myocardium. Subtraction (SDS):  No evidence of ischemia. Transient Ischemic Dilatation (Normal <1.22):  0.96 Lung/Heart Ratio (Normal <0.45):  0.29  Quantitative Gated Spect Images QGS EDV:  108 ml QGS ESV:  38 ml  Impression Exercise Capacity:  Lexiscan with no exercise. BP Response:  Normal blood pressure response. Clinical Symptoms:  Felt lightheaded at the beginning of study, shortness of breath. ECG Impression:  Significant bradycardia noted during pharmacologic infusion of Lexiscan. Comparison with Prior Nuclear Study: No images to compare  Overall Impression:  Normal stress nuclear study. Low risk study with no evidence of ischemia identified. Transient bradycardia noted during pharmacologic infusion.  LV Ejection Fraction: 65%.  LV Wall Motion:  NL LV Function; NL Wall Motion   Candee Furbish, MD

## 2014-11-14 ENCOUNTER — Encounter: Payer: Self-pay | Admitting: Internal Medicine

## 2014-11-14 ENCOUNTER — Ambulatory Visit (INDEPENDENT_AMBULATORY_CARE_PROVIDER_SITE_OTHER): Payer: Medicare Other | Admitting: Internal Medicine

## 2014-11-14 VITALS — BP 122/68 | HR 99 | Ht 70.0 in | Wt 270.1 lb

## 2014-11-14 DIAGNOSIS — R0602 Shortness of breath: Secondary | ICD-10-CM

## 2014-11-14 NOTE — Progress Notes (Signed)
Cardiology Office Note   Date:  11/14/2014   ID:  Micheal Morris, DOB January 15, 1946, MRN [id number]  PCP:  Chesley Noon, MD  Cardiologist:   Dorris Carnes, MD   No chief complaint on file.     History of Present Illness: Micheal Morris is a 69 y.o. male with a history of  Pateitn presents for evaluation of SOB and L arm discomfort   History of Present Illness: Pateint is a 69 yo who I saw back in March for SOB and L arm discomfort    I recomm a lexiscan myoview and echo   Myoview was normal  LVEF 65%  Echo was normal.     Current Outpatient Prescriptions  Medication Sig Dispense Refill  . amLODipine-benazepril (LOTREL) 10-20 MG per capsule TAKE 1 CAPSULE BY MOUTH DAILY    . BD PEN NEEDLE NANO U/F 32G X 4 MM MISC Use as directed with insulin  4  . Canagliflozin (INVOKANA) 100 MG TABS Take 1 tablet by mouth daily.    . fesoterodine (TOVIAZ) 4 MG TB24 tablet Take 4 mg by mouth daily.    . fish oil-omega-3 fatty acids 1000 MG capsule Take 2 capsules by mouth daily.     . Flaxseed, Linseed, (FLAX SEED OIL PO) Take 1 tablet by mouth daily.     . fluticasone (FLONASE) 50 MCG/ACT nasal spray Place 2 sprays into the nose daily as needed.     . gabapentin (NEURONTIN) 300 MG capsule Take 300 mg by mouth 2 (two) times daily.     . Glucosamine-Chondroitin 750-600 MG TABS Take 1 tablet by mouth 2 (two) times daily.    . insulin glargine (LANTUS) 100 UNIT/ML injection Inject 70 Units into the skin daily. 70 UNITS PER DAY    . insulin lispro (HUMALOG) 100 UNIT/ML injection Inject 32 Units into the skin.     . Lancets (ONETOUCH ULTRASOFT) lancets Use 1 lancets three (3) times a day with insulin  11  . Liraglutide (VICTOZA) 18 MG/3ML SOPN Inject 18 Units as directed daily.    Marland Kitchen LUMIGAN 0.01 % SOLN Place 1 drop into both eyes at bedtime.  3  . metoprolol succinate (TOPROL-XL) 100 MG 24 hr tablet TAKE 1 TABLET BY MOUTH ONCE DAILY    . Multiple Vitamins-Minerals (MULTIVITAMIN WITH  MINERALS) tablet Take 1 tablet by mouth daily.    . Probiotic Product (PROBIOTIC & ACIDOPHILUS EX ST PO) Take 1 tablet by mouth daily.     . simvastatin (ZOCOR) 20 MG tablet TAKE 1 TABLET BY MOUTH EVERY DAY AT BEDTIME    . spironolactone (ALDACTONE) 25 MG tablet TAKE 1 TABLET BY MOUTH EVERY DAY    . torsemide (DEMADEX) 20 MG tablet TAKE 1 TABLET EVERY DAY    . vardenafil (LEVITRA) 20 MG tablet Take 20 mg by mouth daily as needed for erectile dysfunction.      No current facility-administered medications for this visit.    Allergies:   Nsaids and Aspirin   Past Medical History  Diagnosis Date  . Erectile dysfunction   . OSA (obstructive sleep apnea)   . HTN (hypertension)   . Hyperlipidemia   . Type II diabetes mellitus   . Chronic kidney disease   . Sensory hearing loss, bilateral   . Restrictive lung disease   . Obesity   . SOBOE (shortness of breath on exertion)   . Pain of left upper arm     Past Surgical History  Procedure Laterality Date  .  Colonoscopy       Social History:  The patient  reports that he quit smoking about 40 years ago. His smoking use included Cigarettes. He has a 7 pack-year smoking history. He has never used smokeless tobacco. He reports that he does not drink alcohol or use illicit drugs.   Family History:  The patient's family history includes Diabetes in his brother, mother, and sister; Hypertension in his brother and mother; Stroke in his mother.    ROS:  Please see the history of present illness. All other systems are reviewed and  Negative to the above problem except as noted.    PHYSICAL EXAM: VS:  BP 122/68 mmHg  Pulse 99  Ht 5\' 10"  (1.778 m)  Wt 270 lb 1.9 oz (122.526 kg)  BMI 38.76 kg/m2  SpO2 97%  GEN: Well nourished, well developed, in no acute distress HEENT: normal Neck: no JVD, carotid bruits, or masses Cardiac: RRR; no murmurs, rubs, or gallops,no edema  Respiratory:  clear to auscultation bilaterally, normal work of  breathing GI: soft, nontender, nondistended, + BS  No hepatomegaly  MS: no deformity Moving all extremities   Skin: warm and dry, no rash Neuro:  Strength and sensation are intact Psych: euthymic mood, full affect   EKG:  EKG is ordered today.   Lipid Panel No results found for: CHOL, TRIG, HDL, CHOLHDL, VLDL, LDLCALC, LDLDIRECT    Wt Readings from Last 3 Encounters:  11/14/14 270 lb 1.9 oz (122.526 kg)  10/22/14 271 lb (122.925 kg)  10/13/14 272 lb 6.4 oz (123.56 kg)      ASSESSMENT AND PLAN:  1.  Dyspnea.  Work up so far negative  I would recomm referral for cardiopulmnary rehab. Patient needs to lose wt     2.  HTN  Adequate control  3.  HL  Good control     Signed, Dorris Carnes, MD  11/14/2014 9:05 AM    [facility], [place], [place]  [address] Phone: [phone]; Fax: [phone]

## 2014-11-14 NOTE — Patient Instructions (Addendum)
Medication Instructions:  Your physician recommends that you continue on your current medications as directed. Please refer to the Current Medication list given to you today.  LAB: NO NEW ORDERS  Testing/Procedures: NO NEW ORDERS  Follow-Up: Your physician wants you to follow-up in: [place].  You will receive a reminder letter in the mail two months in advance. If you don't receive a letter, please call our office to schedule the follow-up appointment.   Any Other Special Instructions Will Be Listed Below (If Applicable). YOU HAVE BEEN REFERRED TO CARDIAC PULMONARY REHAB.  THEY WILL CONTACT YOU WHEN IT IS TIME TO SCHEDULE INITIAL EVALUATION.

## 2014-11-17 ENCOUNTER — Ambulatory Visit (INDEPENDENT_AMBULATORY_CARE_PROVIDER_SITE_OTHER): Payer: Medicare Other | Admitting: Podiatry

## 2014-11-17 DIAGNOSIS — M79673 Pain in unspecified foot: Secondary | ICD-10-CM | POA: Diagnosis not present

## 2014-11-17 DIAGNOSIS — B351 Tinea unguium: Secondary | ICD-10-CM | POA: Diagnosis not present

## 2014-11-17 NOTE — Progress Notes (Signed)
Subjective:     Patient ID: Micheal Morris, male   DOB: [birth year], 69 y.o.   MRN: [id number]  HPI patient presents with thick nailbeds 1-5 both feet that are impossible for him to cut and painful   Review of Systems     Objective:   Physical Exam Neurovascular status intact with thick yellow brittle nailbeds 1-5 both feet    Assessment:     Mycotic nail infection is with pain 1-5 both feet    Plan:     Debridement painful nailbeds 1-5 both feet with no iatrogenic bleeding noted

## 2015-02-16 ENCOUNTER — Ambulatory Visit (INDEPENDENT_AMBULATORY_CARE_PROVIDER_SITE_OTHER): Payer: Medicare Other | Admitting: Podiatry

## 2015-02-16 DIAGNOSIS — M79673 Pain in unspecified foot: Secondary | ICD-10-CM | POA: Diagnosis not present

## 2015-02-16 DIAGNOSIS — B351 Tinea unguium: Secondary | ICD-10-CM | POA: Diagnosis not present

## 2015-02-17 NOTE — Progress Notes (Signed)
Patient ID: LENELL LAMA, male   DOB: 1946/03/07, 69 y.o.   MRN: [id number]  Subjective: 69 y.o. male returns the office today for painful, elongated, thickened toenails which is difficult for him to himself. Denies any redness or drainage around the nails. Denies any acute changes since last appointment and no new complaints today. Denies any systemic complaints such as fevers, chills, nausea, vomiting.   Objective: AAO 3, NAD DP/PT pulses palpable, CRT less than 3 seconds Nails hypertrophic, dystrophic, elongated, brittle, discolored 10. There is tenderness overlying the nails 1-5 bilaterally. There is no surrounding erythema or drainage along the nail sites. No open lesions or pre-ulcerative lesions are identified. No other areas of tenderness bilateral lower extremities. No overlying edema, erythema, increased warmth. No pain with calf compression, swelling, warmth, erythema.  Assessment: Patient presents with symptomatic onychomycosis  Plan: -Treatment options including alternatives, risks, complications were discussed -Nails sharply debrided 10 without complication/bleeding. -Discussed daily foot inspection. If there are any changes, to call the office immediately.  -Follow-up in 3 months or sooner if any problems are to arise. In the meantime, encouraged to call the office with any questions, concerns, changes symptoms.  Celesta Gentile, DPM

## 2015-03-20 ENCOUNTER — Ambulatory Visit: Payer: Medicare Other | Admitting: Pulmonary Disease

## 2015-03-30 ENCOUNTER — Ambulatory Visit (HOSPITAL_COMMUNITY): Payer: Medicare Other

## 2015-04-22 ENCOUNTER — Ambulatory Visit: Payer: Medicare Other | Admitting: Podiatry

## 2015-04-24 ENCOUNTER — Encounter: Payer: Self-pay | Admitting: Cardiology

## 2015-04-24 ENCOUNTER — Encounter: Payer: Self-pay | Admitting: Podiatry

## 2015-04-24 ENCOUNTER — Ambulatory Visit (INDEPENDENT_AMBULATORY_CARE_PROVIDER_SITE_OTHER): Payer: Medicare Other | Admitting: Podiatry

## 2015-04-24 DIAGNOSIS — M79673 Pain in unspecified foot: Secondary | ICD-10-CM | POA: Diagnosis not present

## 2015-04-24 DIAGNOSIS — B351 Tinea unguium: Secondary | ICD-10-CM | POA: Diagnosis not present

## 2015-04-24 NOTE — Progress Notes (Signed)
Patient ID: Micheal Morris, male   DOB: [birth year], 69 y.o.   MRN: [id number] Complaint:  Visit Type: Patient returns to my office for continued preventative foot care services. Complaint: Patient states" my nails have grown long and thick and become painful to walk and wear shoes" Patient has been diagnosed with DM with no foot complications. The patient presents for preventative foot care services. No changes to ROS  Podiatric Exam: Vascular: dorsalis pedis and posterior tibial pulses are palpable bilateral. Capillary return is immediate. Temperature gradient is WNL. Skin turgor WNL  Sensorium: Normal Semmes Weinstein monofilament test. Normal tactile sensation bilaterally. Nail Exam: Pt has thick disfigured discolored nails with subungual debris noted bilateral entire nail hallux through fifth toenails Ulcer Exam: There is no evidence of ulcer or pre-ulcerative changes or infection. Orthopedic Exam: Muscle tone and strength are WNL. No limitations in general ROM. No crepitus or effusions noted. Foot type and digits show no abnormalities. Bony prominences are unremarkable. Skin: No Porokeratosis. No infection or ulcers  Diagnosis:  Onychomycosis, , Pain in right toe, pain in left toes  Treatment & Plan Procedures and Treatment: Consent by patient was obtained for treatment procedures. The patient understood the discussion of treatment and procedures well. All questions were answered thoroughly reviewed. Debridement of mycotic and hypertrophic toenails, 1 through 5 bilateral and clearing of subungual debris. No ulceration, no infection noted.  Return Visit-Office Procedure: Patient instructed to return to the office for a follow up visit 3 months for continued evaluation and treatment.

## 2015-04-27 ENCOUNTER — Ambulatory Visit (INDEPENDENT_AMBULATORY_CARE_PROVIDER_SITE_OTHER): Payer: Medicare Other | Admitting: Adult Health

## 2015-04-27 ENCOUNTER — Encounter: Payer: Self-pay | Admitting: Adult Health

## 2015-04-27 VITALS — BP 134/66 | HR 90 | Temp 98.2°F | Ht 70.0 in | Wt 276.0 lb

## 2015-04-27 DIAGNOSIS — G4733 Obstructive sleep apnea (adult) (pediatric): Secondary | ICD-10-CM | POA: Diagnosis not present

## 2015-04-27 NOTE — Progress Notes (Signed)
   Subjective:    Patient ID: Micheal Morris, male    DOB: 10-27-1945, 69 y.o.   MRN: [id number]  HPI 69 yo male obese with severe  OSA.   TEST :  NPSG 2003:  AHI 60/hr.  [employer] 2013:  Optimal pressure 12cm.   04/27/2015 Follow up : OSA (former Dr. Gwenette Greet patient) Pt returns for yearly follow up .  Pt remains on CPAP At bedtime   Says he is doing well . Feels rested.  On occasion takes a nap during daytime-if he does will wear CPAP.  Wears every night at least 6-8hr  Never misses a night. Needs download.  Already had flu shot.  Needs order for new mask and supplies.  Denies chest pain, orthopnea, or edema.  Wt has tr up a few pounds. Discussed wt loss.       Review of Systems Constitutional:   No  weight loss, night sweats,  Fevers, chills, fatigue, or  lassitude.  HEENT:   No headaches,  Difficulty swallowing,  Tooth/dental problems, or  Sore throat,                No sneezing, itching, ear ache, nasal congestion, post nasal drip,   CV:  No chest pain,  Orthopnea, PND, swelling in lower extremities, anasarca, dizziness, palpitations, syncope.   GI  No heartburn, indigestion, abdominal pain, nausea, vomiting, diarrhea, change in bowel habits, loss of appetite, bloody stools.   Resp: No shortness of breath with exertion or at rest.  No excess mucus, no productive cough,  No non-productive cough,  No coughing up of blood.  No change in color of mucus.  No wheezing.  No chest wall deformity  Skin: no rash or lesions.  GU: no dysuria, change in color of urine, no urgency or frequency.  No flank pain, no hematuria   MS:  No joint pain or swelling.  No decreased range of motion.  No back pain.  Psych:  No change in mood or affect. No depression or anxiety.  No memory loss.         Objective:   Physical Exam GEN: A/Ox3; pleasant , NAD, obese   HEENT:  [place]/AT,  EACs-clear, TMs-wnl, NOSE-clear, THROAT-clear, no lesions, no postnasal drip or exudate noted. Class 2-3  airway   NECK:  Supple w/ fair ROM; no JVD; normal carotid impulses w/o bruits; no thyromegaly or nodules palpated; no lymphadenopathy.  RESP  Clear  P & A; w/o, wheezes/ rales/ or rhonchi.no accessory muscle use, no dullness to percussion  CARD:  RRR, no m/r/g  , tr  peripheral edema, pulses intact, no cyanosis or clubbing.  GI:   Soft & nt; nml bowel sounds; no organomegaly or masses detected.  Musco: Warm bil, no deformities or joint swelling noted.   Neuro: alert, no focal deficits noted.    Skin: Warm, no lesions or rashes        Assessment & Plan:

## 2015-04-27 NOTE — Assessment & Plan Note (Signed)
Severe OSA controlled on CPAP At bedtime   Download requested   Plan  Continue on CPAP At bedtime   Keep up good work  [employer] requested Order sent for new  Mask and supplies.  Do not drive if sleepy.  Follow up Dr. Halford Chessman  In 1 year and As needed

## 2015-04-27 NOTE — Addendum Note (Signed)
Addended by: Osa Craver on: 04/27/2015 04:57 PM   Modules accepted: Orders

## 2015-04-27 NOTE — Patient Instructions (Signed)
Continue on CPAP At bedtime   Keep up good work  [employer] requested Order sent for [employer] and supplies.  Do not drive if sleepy.  Follow up Dr. Halford Chessman  In 1 year and As needed

## 2015-04-27 NOTE — Assessment & Plan Note (Signed)
Work on weight loss.

## 2015-04-27 NOTE — Progress Notes (Signed)
Reviewed and agree with assessment/plan. 

## 2015-06-02 ENCOUNTER — Telehealth: Payer: Self-pay | Admitting: Adult Health

## 2015-06-02 NOTE — Telephone Encounter (Signed)
Per TP after reviewing CPAP compliance report on 06/02/2015 Good Control Keep up the good work   [employer] and spoke with pt. Reviewed results. Pt is on recall for 1 year ov with VS. Pt voiced understanding and had no further questions. Nothing further needed, will sign off on message.

## 2015-06-08 ENCOUNTER — Telehealth: Payer: Self-pay | Admitting: *Deleted

## 2015-06-08 NOTE — Telephone Encounter (Signed)
Surgical clearance request from [facility] (signed by Dr. Harrington Challenger, "from a cardiac standpoint") placed in nurse fax bin in medical records to be faxed.

## 2015-07-01 ENCOUNTER — Ambulatory Visit (INDEPENDENT_AMBULATORY_CARE_PROVIDER_SITE_OTHER): Payer: Medicare Other | Admitting: Podiatry

## 2015-07-01 ENCOUNTER — Encounter: Payer: Self-pay | Admitting: Podiatry

## 2015-07-01 DIAGNOSIS — E119 Type 2 diabetes mellitus without complications: Secondary | ICD-10-CM

## 2015-07-01 DIAGNOSIS — M79673 Pain in unspecified foot: Secondary | ICD-10-CM | POA: Diagnosis not present

## 2015-07-01 DIAGNOSIS — B351 Tinea unguium: Secondary | ICD-10-CM

## 2015-07-01 NOTE — Progress Notes (Signed)
Patient ID: [name], male   DOB: 01/20/1946, 69 y.o.   MRN: [id number] Complaint:  Visit Type: Patient returns to my office for continued preventative foot care services. 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 The patient presents for preventative foot care services. No changes to ROS  Podiatric Exam: Vascular: dorsalis pedis and posterior tibial pulses are palpable bilateral. Capillary return is immediate. Temperature gradient is WNL. Skin turgor WNL  Sensorium: Normal Semmes Weinstein monofilament test. Normal tactile sensation bilaterally. Nail Exam: Pt has thick disfigured discolored nails with subungual debris noted bilateral entire nail hallux through fifth toenails Ulcer Exam: There is no evidence of ulcer or pre-ulcerative changes or infection. Orthopedic Exam: Muscle tone and strength are WNL. No limitations in general ROM. No crepitus or effusions noted. Foot type and digits show no abnormalities. Bony prominences are unremarkable. Skin: No Porokeratosis. No infection or ulcers  Diagnosis:  Onychomycosis, , Pain in right toe, pain in left toes  Treatment & Plan Procedures and Treatment: Consent by patient was obtained for treatment procedures. The patient understood the discussion of treatment and procedures well. All questions were answered thoroughly reviewed. Debridement of mycotic and hypertrophic toenails, 1 through 5 bilateral and clearing of subungual debris. No ulceration, no infection noted.  Return Visit-Office Procedure: Patient instructed to return to the office for a follow up visit 3 months for continued evaluation and treatment.  Gardiner Barefoot DPM

## 2015-07-02 ENCOUNTER — Encounter: Payer: Self-pay | Admitting: Adult Health

## 2015-09-03 ENCOUNTER — Ambulatory Visit: Payer: Self-pay | Admitting: Orthopedic Surgery

## 2015-09-03 NOTE — Progress Notes (Signed)
Preoperative surgical orders have been place into the Epic hospital system for Micheal Morris on 09/03/2015, 12:48 PM  by Mickel Crow for surgery on 09-21-15.  Preop Total Knee orders including Experal, IV Tylenol, and IV Decadron as long as there are no contraindications to the above medications. [name], PA-C

## 2015-09-09 ENCOUNTER — Ambulatory Visit (INDEPENDENT_AMBULATORY_CARE_PROVIDER_SITE_OTHER): Payer: Medicare Other | Admitting: Podiatry

## 2015-09-09 ENCOUNTER — Encounter: Payer: Self-pay | Admitting: Podiatry

## 2015-09-09 DIAGNOSIS — E119 Type 2 diabetes mellitus without complications: Secondary | ICD-10-CM

## 2015-09-09 DIAGNOSIS — B351 Tinea unguium: Secondary | ICD-10-CM

## 2015-09-09 DIAGNOSIS — M79673 Pain in unspecified foot: Secondary | ICD-10-CM | POA: Diagnosis not present

## 2015-09-09 NOTE — Progress Notes (Signed)
Patient ID: Micheal Morris, male   DOB: 01/20/1946, 70 y.o.   MRN: [id number] Complaint:  Visit Type: Patient returns to my office for continued preventative foot care services. 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 The patient presents for preventative foot care services. No changes to ROS  Podiatric Exam: Vascular: dorsalis pedis and posterior tibial pulses are palpable bilateral. Capillary return is immediate. Temperature gradient is WNL. Skin turgor WNL  Sensorium: Normal Semmes Weinstein monofilament test. Normal tactile sensation bilaterally. Nail Exam: Pt has thick disfigured discolored nails with subungual debris noted bilateral entire nail hallux through fifth toenails Ulcer Exam: There is no evidence of ulcer or pre-ulcerative changes or infection. Orthopedic Exam: Muscle tone and strength are WNL. No limitations in general ROM. No crepitus or effusions noted. Foot type and digits show no abnormalities. Bony prominences are unremarkable. Skin: No Porokeratosis. No infection or ulcers  Diagnosis:  Onychomycosis, , Pain in right toe, pain in left toes  Treatment & Plan Procedures and Treatment: Consent by patient was obtained for treatment procedures. The patient understood the discussion of treatment and procedures well. All questions were answered thoroughly reviewed. Debridement of mycotic and hypertrophic toenails, 1 through 5 bilateral and clearing of subungual debris. No ulceration, no infection noted.  Return Visit-Office Procedure: Patient instructed to return to the office for a follow up visit 3 months for continued evaluation and treatment.  Gardiner Barefoot DPM

## 2015-09-10 NOTE — Patient Instructions (Addendum)
Micheal Morris  09/10/2015   Your procedure is scheduled on: MONDAY 09/21/2015  Report to [facility]  Entrance take [place]  elevators to 3rd floor to  [place] at  [phone] AM.  Call this number if you have problems the morning of surgery [phone]   Remember: ONLY 1 PERSON MAY GO WITH YOU TO SHORT STAY TO GET  READY MORNING OF [place].   Do not eat food or drink liquids :After Midnight.              TAKE 1/2 DOSE OF HUMALOG INSULIN NIGHT BEFORE SURGERY!   BRING CPAP MASK AND TUBING WITH YOU THE MORNING OF SURGERY!   Take these medicines the morning of surgery with A SIP OF WATER: use eye drops, Gabapentin, Metoprolol                          DO NOT TAKE ANY DIABETIC MEDICATIONS DAY OF YOUR SURGERY!                               You may not have any metal on your body including hair pins and              piercings  Do not wear jewelry, make-up, lotions, powders or perfumes, deodorant             Do not wear nail polish.  Do not shave  48 hours prior to surgery.              Men may shave face and neck.   Do not bring valuables to the hospital. Long.  Contacts, dentures or bridgework may not be worn into surgery.  Leave suitcase in the car. After surgery it may be brought to your room.                  Please read over the following fact sheets you were given: _____________________________________________________________________             [facility] - Preparing for Surgery Before surgery, you can play an important role.  Because skin is not sterile, your skin needs to be as free of germs as possible.  You can reduce the number of germs on your skin by washing with CHG (chlorahexidine gluconate) soap before surgery.  CHG is an antiseptic cleaner which kills germs and bonds with the skin to continue killing germs even after washing. Please DO NOT use if you have an allergy to CHG  or antibacterial soaps.  If your skin becomes reddened/irritated stop using the CHG and inform your nurse when you arrive at Short Stay. Do not shave (including legs and underarms) for at least 48 hours prior to the first CHG shower.  You may shave your face/neck. Please follow these instructions carefully:  1.  Shower with CHG Soap the night before surgery and the  morning of Surgery.  2.  If you choose to wash your hair, wash your hair first as usual with your  normal  shampoo.  3.  After you shampoo, rinse your hair and body thoroughly to remove the  shampoo.  4.  Use CHG as you would any other liquid soap.  You can apply chg directly  to the skin and wash                       Gently with a scrungie or clean washcloth.  5.  Apply the CHG Soap to your body ONLY FROM THE NECK DOWN.   Do not use on face/ open                           Wound or open sores. Avoid contact with eyes, ears mouth and genitals (private parts).                       Wash face,  Genitals (private parts) with your normal soap.             6.  Wash thoroughly, paying special attention to the area where your surgery  will be performed.  7.  Thoroughly rinse your body with warm water from the neck down.  8.  DO NOT shower/wash with your normal soap after using and rinsing off  the CHG Soap.                9.  Pat yourself dry with a clean towel.            10.  Wear clean pajamas.            11.  Place clean sheets on your bed the night of your first shower and do not  sleep with pets. Day of Surgery : Do not apply any lotions/deodorants the morning of surgery.  Please wear clean clothes to the hospital/surgery center.  FAILURE TO FOLLOW THESE INSTRUCTIONS MAY RESULT IN THE CANCELLATION OF YOUR SURGERY PATIENT SIGNATURE_________________________________  NURSE SIGNATURE__________________________________  ________________________________________________________________________   Micheal Morris  An incentive spirometer is a tool that can help keep your lungs clear and active. This tool measures how well you are filling your lungs with each breath. Taking long deep breaths may help reverse or decrease the chance of developing breathing (pulmonary) problems (especially infection) following:  A long period of time when you are unable to move or be active. BEFORE THE PROCEDURE   If the spirometer includes an indicator to show your best effort, your nurse or respiratory therapist will set it to a desired goal.  If possible, sit up straight or lean slightly forward. Try not to slouch.  Hold the incentive spirometer in an upright position. INSTRUCTIONS FOR USE   Sit on the edge of your bed if possible, or sit up as far as you can in bed or on a chair.  Hold the incentive spirometer in an upright position.  Breathe out normally.  Place the mouthpiece in your mouth and seal your lips tightly around it.  Breathe in slowly and as deeply as possible, raising the piston or the ball toward the top of the column.  Hold your breath for 3-5 seconds or for as long as possible. Allow the piston or ball to fall to the bottom of the column.  Remove the mouthpiece from your mouth and breathe out normally.  Rest for a few seconds and repeat Steps 1 through 7 at least 10 times every 1-2 hours when you are awake. Take your time and take a few normal breaths between deep breaths.  The spirometer may include an indicator to show  your best effort. Use the indicator as a goal to work toward during each repetition.  After each set of 10 deep breaths, practice coughing to be sure your lungs are clear. If you have an incision (the cut made at the time of surgery), support your incision when coughing by placing a pillow or rolled up towels firmly against it. Once you are able to get out of bed, walk around indoors and cough well. You may stop using the incentive spirometer when instructed by  your caregiver.  RISKS AND COMPLICATIONS  Take your time so you do not get dizzy or light-headed.  If you are in pain, you may need to take or ask for pain medication before doing incentive spirometry. It is harder to take a deep breath if you are having pain. AFTER USE  Rest and breathe slowly and easily.  It can be helpful to keep track of a log of your progress. Your caregiver can provide you with a simple table to help with this. If you are using the spirometer at home, follow these instructions: [place] IF:   You are having difficultly using the spirometer.  You have trouble using the spirometer as often as instructed.  Your pain medication is not giving enough relief while using the spirometer.  You develop fever of 100.5 F (38.1 C) or higher. SEEK IMMEDIATE MEDICAL CARE IF:   You cough up bloody sputum that had not been present before.  You develop fever of 102 F (38.9 C) or greater.  You develop worsening pain at or near the incision site. MAKE SURE YOU:   Understand these instructions.  Will watch your condition.  Will get help right away if you are not doing well or get worse. Document Released: 11/28/2006 Document Revised: 10/10/2011 Document Reviewed: 01/29/2007 ExitCare Patient Information 2014 ExitCare, [place].   ________________________________________________________________________  WHAT IS A BLOOD TRANSFUSION? Blood Transfusion Information  A transfusion is the replacement of blood or some of its parts. Blood is made up of multiple cells which provide different functions.  Red blood cells carry oxygen and are used for blood loss replacement.  White blood cells fight against infection.  Platelets control bleeding.  Plasma helps clot blood.  Other blood products are available for specialized needs, such as hemophilia or other clotting disorders. BEFORE THE TRANSFUSION  Who gives blood for transfusions?   Healthy volunteers who are  fully evaluated to make sure their blood is safe. This is blood bank blood. Transfusion therapy is the safest it has ever been in the practice of medicine. Before blood is taken from a donor, a complete history is taken to make sure that person has no history of diseases nor engages in risky social behavior (examples are intravenous drug use or sexual activity with multiple partners). The donor's travel history is screened to minimize risk of transmitting infections, such as malaria. The donated blood is tested for signs of infectious diseases, such as HIV and hepatitis. The blood is then tested to be sure it is compatible with you in order to minimize the chance of a transfusion reaction. If you or a relative donates blood, this is often done in anticipation of surgery and is not appropriate for emergency situations. It takes many days to process the donated blood. RISKS AND COMPLICATIONS Although transfusion therapy is very safe and saves many lives, the main dangers of transfusion include:   Getting an infectious disease.  Developing a transfusion reaction. This is an allergic reaction to  something in the blood you were given. Every precaution is taken to prevent this. The decision to have a blood transfusion has been considered carefully by your caregiver before blood is given. Blood is not given unless the benefits outweigh the risks. AFTER THE TRANSFUSION  Right after receiving a blood transfusion, you will usually feel much better and more energetic. This is especially true if your red blood cells have gotten low (anemic). The transfusion raises the level of the red blood cells which carry oxygen, and this usually causes an energy increase.  The nurse administering the transfusion will monitor you carefully for complications. HOME CARE INSTRUCTIONS  No special instructions are needed after a transfusion. You may find your energy is better. Speak with your caregiver about any limitations on  activity for underlying diseases you may have. SEEK MEDICAL CARE IF:   Your condition is not improving after your transfusion.  You develop redness or irritation at the intravenous (IV) site. SEEK IMMEDIATE MEDICAL CARE IF:  Any of the following symptoms occur over the next 12 hours:  Shaking chills.  You have a temperature by mouth above 102 F (38.9 C), not controlled by medicine.  Chest, back, or muscle pain.  People around you feel you are not acting correctly or are confused.  Shortness of breath or difficulty breathing.  Dizziness and fainting.  You get a rash or develop hives.  You have a decrease in urine output.  Your urine turns a dark color or changes to pink, red, or brown. Any of the following symptoms occur over the next 10 days:  You have a temperature by mouth above 102 F (38.9 C), not controlled by medicine.  Shortness of breath.  Weakness after normal activity.  The white part of the eye turns yellow (jaundice).  You have a decrease in the amount of urine or are urinating less often.  Your urine turns a dark color or changes to pink, red, or brown. Document Released: 07/15/2000 Document Revised: 10/10/2011 Document Reviewed: 03/03/2008 [facility] Patient Information 2014 [place], [place].  _______________________________________________________________________

## 2015-09-14 ENCOUNTER — Encounter (HOSPITAL_COMMUNITY): Payer: Self-pay

## 2015-09-14 ENCOUNTER — Encounter (HOSPITAL_COMMUNITY)
Admission: RE | Admit: 2015-09-14 | Discharge: 2015-09-14 | Disposition: A | Discharge status: Home / Self Care | Payer: Medicare Other | Source: Ambulatory Visit | Attending: Orthopedic Surgery | Admitting: Orthopedic Surgery

## 2015-09-14 DIAGNOSIS — Z0183 Encounter for blood typing: Secondary | ICD-10-CM | POA: Diagnosis not present

## 2015-09-14 DIAGNOSIS — Z01812 Encounter for preprocedural laboratory examination: Secondary | ICD-10-CM | POA: Insufficient documentation

## 2015-09-14 DIAGNOSIS — M1712 Unilateral primary osteoarthritis, left knee: Secondary | ICD-10-CM | POA: Insufficient documentation

## 2015-09-14 HISTORY — DX: Unspecified osteoarthritis, unspecified site: M19.90

## 2015-09-14 LAB — COMPREHENSIVE METABOLIC PANEL
ALT: 19 U/L (ref 17–63)
AST: 25 U/L (ref 15–41)
Albumin: 4 g/dL (ref 3.5–5.0)
Alkaline Phosphatase: 90 U/L (ref 38–126)
Anion gap: 10 (ref 5–15)
BUN: 23 mg/dL — AB (ref 6–20)
CHLORIDE: 103 mmol/L (ref 101–111)
CO2: 27 mmol/L (ref 22–32)
CREATININE: 1.69 mg/dL — AB (ref 0.61–1.24)
Calcium: 9.4 mg/dL (ref 8.9–10.3)
GFR calc Af Amer: 46 mL/min — ABNORMAL LOW (ref >60)
GFR, EST NON AFRICAN AMERICAN: 40 mL/min — AB (ref >60)
Glucose, Bld: 178 mg/dL — ABNORMAL HIGH (ref 65–99)
POTASSIUM: 4.4 mmol/L (ref 3.5–5.1)
Sodium: 140 mmol/L (ref 135–145)
Total Bilirubin: 0.7 mg/dL (ref 0.3–1.2)
Total Protein: 7.6 g/dL (ref 6.5–8.1)

## 2015-09-14 LAB — CBC
HCT: 43.9 % (ref 39.0–52.0)
Hemoglobin: 14.1 g/dL (ref 13.0–17.0)
MCH: 25.9 pg — ABNORMAL LOW (ref 26.0–34.0)
MCHC: 32.1 g/dL (ref 30.0–36.0)
MCV: 80.6 fL (ref 78.0–100.0)
PLATELETS: 268 10*3/uL (ref 150–400)
RBC: 5.45 MIL/uL (ref 4.22–5.81)
RDW: 14.5 % (ref 11.5–15.5)
WBC: 12 10*3/uL — AB (ref 4.0–10.5)

## 2015-09-14 LAB — URINALYSIS, ROUTINE W REFLEX MICROSCOPIC
BILIRUBIN URINE: NEGATIVE
Glucose, UA: >1000 mg/dL — AB
HGB URINE DIPSTICK: NEGATIVE
KETONES UR: NEGATIVE mg/dL
Nitrite: NEGATIVE
PH: 6 (ref 5.0–8.0)
Protein, ur: NEGATIVE mg/dL
SPECIFIC GRAVITY, URINE: 1.02 (ref 1.005–1.030)

## 2015-09-14 LAB — APTT: aPTT: 30 seconds (ref 24–37)

## 2015-09-14 LAB — SURGICAL PCR SCREEN
MRSA, PCR: NEGATIVE
Staphylococcus aureus: POSITIVE — AB

## 2015-09-14 LAB — PROTIME-INR
INR: 1 (ref 0.00–1.49)
PROTHROMBIN TIME: 13.4 s (ref 11.6–15.2)

## 2015-09-14 LAB — URINE MICROSCOPIC-ADD ON

## 2015-09-14 LAB — ABO/RH: ABO/RH(D): O POS

## 2015-09-14 NOTE — Progress Notes (Signed)
05/28/2015- Pre-operative clearance from Dr. Melford Aase on chart.  11/19/2014-Pre-operative clearance  from Dr.P. Harrington Challenger on chart. 07/17/2015- Last office note  from Dr. Pearson Grippe from [facility] on chart.

## 2015-09-15 LAB — HEMOGLOBIN A1C
Hgb A1c MFr Bld: 7.7 % — ABNORMAL HIGH (ref 4.8–5.6)
Mean Plasma Glucose: 174 mg/dL

## 2015-09-21 LAB — TYPE AND SCREEN
ABO/RH(D): O POS
ANTIBODY SCREEN: NEGATIVE

## 2015-10-02 ENCOUNTER — Telehealth: Payer: Self-pay | Admitting: Internal Medicine

## 2015-10-02 NOTE — Telephone Encounter (Signed)
New message   Pt wants to speak to rn   He states he was already cleared for surgery and the date has been changed. Question: do pt still have to come in again to see Dr. Harrington Challenger to have surgical clearance?  Pt waiting for rn call

## 2015-10-02 NOTE — Telephone Encounter (Signed)
Left detailed voice mail that the surgeon will be the one to decide if more recent cardiac clearance is needed. I advised patient to check with them, if so, they will contact us for further clearance.  Also advised to call the office on Monday if any further questions.

## 2015-10-08 ENCOUNTER — Telehealth: Payer: Self-pay | Admitting: *Deleted

## 2015-10-08 NOTE — Telephone Encounter (Signed)
Clearance form from [facility].  Address by Dr. Harrington Challenger.   Placed in nurse fax bin in medical records to be faxed.

## 2015-10-29 ENCOUNTER — Ambulatory Visit: Payer: Self-pay | Admitting: Orthopedic Surgery

## 2015-10-29 NOTE — Progress Notes (Signed)
Preoperative surgical orders have been place into the Epic hospital system for Micheal Morris on 10/29/2015, 4:36 PM  by Mickel Crow for surgery on 11-09-15.  Preop Total Knee orders including Experal, IV Tylenol, and IV Decadron as long as there are no contraindications to the above medications. [name], PA-C

## 2015-11-03 ENCOUNTER — Encounter (HOSPITAL_COMMUNITY): Payer: Self-pay

## 2015-11-03 ENCOUNTER — Encounter (HOSPITAL_COMMUNITY)
Admission: RE | Admit: 2015-11-03 | Discharge: 2015-11-03 | Disposition: A | Discharge status: Home / Self Care | Payer: Medicare Other | Source: Ambulatory Visit | Attending: Orthopedic Surgery | Admitting: Orthopedic Surgery

## 2015-11-03 DIAGNOSIS — I10 Essential (primary) hypertension: Secondary | ICD-10-CM | POA: Diagnosis not present

## 2015-11-03 DIAGNOSIS — M1712 Unilateral primary osteoarthritis, left knee: Secondary | ICD-10-CM | POA: Diagnosis not present

## 2015-11-03 DIAGNOSIS — Z01810 Encounter for preprocedural cardiovascular examination: Secondary | ICD-10-CM | POA: Insufficient documentation

## 2015-11-03 DIAGNOSIS — Z01812 Encounter for preprocedural laboratory examination: Secondary | ICD-10-CM | POA: Diagnosis not present

## 2015-11-03 DIAGNOSIS — R0602 Shortness of breath: Secondary | ICD-10-CM

## 2015-11-03 HISTORY — DX: Shortness of breath: R06.02

## 2015-11-03 HISTORY — DX: Myoneural disorder, unspecified: G70.9

## 2015-11-03 LAB — SURGICAL PCR SCREEN
MRSA, PCR: NEGATIVE
Staphylococcus aureus: NEGATIVE

## 2015-11-03 NOTE — Patient Instructions (Signed)
Micheal Morris  11/03/2015   Your procedure is scheduled on: MondayApril 10, 2017  Report to [facility] Main  Entrance take [facility]  elevators to 3rd floor to  [place] at 5:00 AM.  Call this number if you have problems the morning of surgery [phone]   Remember: ONLY 1 PERSON MAY GO WITH YOU TO SHORT STAY TO GET  READY MORNING OF [place].  Do not eat food or drink liquids :After Midnight.     Take these medicines the morning of surgery with A SIP OF WATER: Metoprolol; Gabapentin (Neurontin; May use eye drops (bring bottles with you day of surgery)              TAKE 1/2 EVENING DOSE OF INSULIN NIGHT PRIOR TO SURGERY  DO NOT TAKE ANY DIABETIC MEDICATIONS DAY OF YOUR SURGERY                               You may not have any metal on your body including hair pins and              piercings  Do not wear jewelry, lotions, powders or colognes, deodorant             Men may shave face and neck.   Do not bring valuables to the hospital. [place].  Contacts, dentures or bridgework may not be worn into surgery.  Leave suitcase in the car. After surgery it may be brought to your room.   Special Instructions: BRING CPAP MASK AND TUBING WITH YOU DAY OF SURGERY               Please read over the following fact sheets you were given:INCENTIVE SPIROMETER; MRSA INFORMATION SHEET; BLOOD TRANSFUSION INFORMATION SHEET  _____________________________________________________________________             [facility] - Preparing for Surgery Before surgery, you can play an important role.  Because skin is not sterile, your skin needs to be as free of germs as possible.  You can reduce the number of germs on your skin by washing with CHG (chlorahexidine gluconate) soap before surgery.  CHG is an antiseptic cleaner which kills germs and bonds with the skin to continue killing germs even after washing. Please DO NOT  use if you have an allergy to CHG or antibacterial soaps.  If your skin becomes reddened/irritated stop using the CHG and inform your nurse when you arrive at Short Stay. Do not shave (including legs and underarms) for at least 48 hours prior to the first CHG shower.  You may shave your face/neck. Please follow these instructions carefully:  1.  Shower with CHG Soap the night before surgery and the  morning of Surgery.  2.  If you choose to wash your hair, wash your hair first as usual with your  normal  shampoo.  3.  After you shampoo, rinse your hair and body thoroughly to remove the  shampoo.                           4.  Use CHG as you would any other liquid soap.  You can apply chg directly  to the  skin and wash                       Gently with a scrungie or clean washcloth.  5.  Apply the CHG Soap to your body ONLY FROM THE NECK DOWN.   Do not use on face/ open                           Wound or open sores. Avoid contact with eyes, ears mouth and genitals (private parts).                       Wash face,  Genitals (private parts) with your normal soap.             6.  Wash thoroughly, paying special attention to the area where your surgery  will be performed.  7.  Thoroughly rinse your body with warm water from the neck down.  8.  DO NOT shower/wash with your normal soap after using and rinsing off  the CHG Soap.                9.  Pat yourself dry with a clean towel.            10.  Wear clean pajamas.            11.  Place clean sheets on your bed the night of your first shower and do not  sleep with pets. Day of Surgery : Do not apply any lotions/deodorants the morning of surgery.  Please wear clean clothes to the hospital/surgery center.  FAILURE TO FOLLOW THESE INSTRUCTIONS MAY RESULT IN THE CANCELLATION OF YOUR SURGERY PATIENT SIGNATURE_________________________________  NURSE  SIGNATURE__________________________________  ________________________________________________________________________   Micheal Morris  An incentive spirometer is a tool that can help keep your lungs clear and active. This tool measures how well you are filling your lungs with each breath. Taking long deep breaths may help reverse or decrease the chance of developing breathing (pulmonary) problems (especially infection) following:  A long period of time when you are unable to move or be active. BEFORE THE PROCEDURE   If the spirometer includes an indicator to show your best effort, your nurse or respiratory therapist will set it to a desired goal.  If possible, sit up straight or lean slightly forward. Try not to slouch.  Hold the incentive spirometer in an upright position. INSTRUCTIONS FOR USE   Sit on the edge of your bed if possible, or sit up as far as you can in bed or on a chair.  Hold the incentive spirometer in an upright position.  Breathe out normally.  Place the mouthpiece in your mouth and seal your lips tightly around it.  Breathe in slowly and as deeply as possible, raising the piston or the ball toward the top of the column.  Hold your breath for 3-5 seconds or for as long as possible. Allow the piston or ball to fall to the bottom of the column.  Remove the mouthpiece from your mouth and breathe out normally.  Rest for a few seconds and repeat Steps 1 through 7 at least 10 times every 1-2 hours when you are awake. Take your time and take a few normal breaths between deep breaths.  The spirometer may include an indicator to show your best effort. Use the indicator as a goal to work toward during each repetition.  After each set of  10 deep breaths, practice coughing to be sure your lungs are clear. If you have an incision (the cut made at the time of surgery), support your incision when coughing by placing a pillow or rolled up towels firmly against it. Once  you are able to get out of bed, walk around indoors and cough well. You may stop using the incentive spirometer when instructed by your caregiver.  RISKS AND COMPLICATIONS  Take your time so you do not get dizzy or light-headed.  If you are in pain, you may need to take or ask for pain medication before doing incentive spirometry. It is harder to take a deep breath if you are having pain. AFTER USE  Rest and breathe slowly and easily.  It can be helpful to keep track of a log of your progress. Your caregiver can provide you with a simple table to help with this. If you are using the spirometer at home, follow these instructions: [place] IF:   You are having difficultly using the spirometer.  You have trouble using the spirometer as often as instructed.  Your pain medication is not giving enough relief while using the spirometer.  You develop fever of 100.5 F (38.1 C) or higher. SEEK IMMEDIATE MEDICAL CARE IF:   You cough up bloody sputum that had not been present before.  You develop fever of 102 F (38.9 C) or greater.  You develop worsening pain at or near the incision site. MAKE SURE YOU:   Understand these instructions.  Will watch your condition.  Will get help right away if you are not doing well or get worse. Document Released: 11/28/2006 Document Revised: 10/10/2011 Document Reviewed: 01/29/2007 ExitCare Patient Information 2014 ExitCare, [place].   ________________________________________________________________________  WHAT IS A BLOOD TRANSFUSION? Blood Transfusion Information  A transfusion is the replacement of blood or some of its parts. Blood is made up of multiple cells which provide different functions.  Red blood cells carry oxygen and are used for blood loss replacement.  White blood cells fight against infection.  Platelets control bleeding.  Plasma helps clot blood.  Other blood products are available for specialized needs, such as  hemophilia or other clotting disorders. BEFORE THE TRANSFUSION  Who gives blood for transfusions?   Healthy volunteers who are fully evaluated to make sure their blood is safe. This is blood bank blood. Transfusion therapy is the safest it has ever been in the practice of medicine. Before blood is taken from a donor, a complete history is taken to make sure that person has no history of diseases nor engages in risky social behavior (examples are intravenous drug use or sexual activity with multiple partners). The donor's travel history is screened to minimize risk of transmitting infections, such as malaria. The donated blood is tested for signs of infectious diseases, such as HIV and hepatitis. The blood is then tested to be sure it is compatible with you in order to minimize the chance of a transfusion reaction. If you or a relative donates blood, this is often done in anticipation of surgery and is not appropriate for emergency situations. It takes many days to process the donated blood. RISKS AND COMPLICATIONS Although transfusion therapy is very safe and saves many lives, the main dangers of transfusion include:   Getting an infectious disease.  Developing a transfusion reaction. This is an allergic reaction to something in the blood you were given. Every precaution is taken to prevent this. The decision to have a blood  transfusion has been considered carefully by your caregiver before blood is given. Blood is not given unless the benefits outweigh the risks. AFTER THE TRANSFUSION  Right after receiving a blood transfusion, you will usually feel much better and more energetic. This is especially true if your red blood cells have gotten low (anemic). The transfusion raises the level of the red blood cells which carry oxygen, and this usually causes an energy increase.  The nurse administering the transfusion will monitor you carefully for complications. HOME CARE INSTRUCTIONS  No special  instructions are needed after a transfusion. You may find your energy is better. Speak with your caregiver about any limitations on activity for underlying diseases you may have. SEEK MEDICAL CARE IF:   Your condition is not improving after your transfusion.  You develop redness or irritation at the intravenous (IV) site. SEEK IMMEDIATE MEDICAL CARE IF:  Any of the following symptoms occur over the next 12 hours:  Shaking chills.  You have a temperature by mouth above 102 F (38.9 C), not controlled by medicine.  Chest, back, or muscle pain.  People around you feel you are not acting correctly or are confused.  Shortness of breath or difficulty breathing.  Dizziness and fainting.  You get a rash or develop hives.  You have a decrease in urine output.  Your urine turns a dark color or changes to pink, red, or brown. Any of the following symptoms occur over the next 10 days:  You have a temperature by mouth above 102 F (38.9 C), not controlled by medicine.  Shortness of breath.  Weakness after normal activity.  The white part of the eye turns yellow (jaundice).  You have a decrease in the amount of urine or are urinating less often.  Your urine turns a dark color or changes to pink, red, or brown. Document Released: 07/15/2000 Document Revised: 10/10/2011 Document Reviewed: 03/03/2008 [facility] Patient Information 2014 [place], [place].  _______________________________________________________________________

## 2015-11-04 NOTE — Progress Notes (Signed)
CBCD,PT,PTT, urinalysis/micro, A1C and CMP results per chart 11/02/2015 CMP results from 11/02/2015 sent to [name] PA and Dr Pilar Plate Aluisio/fax Clearance note per Dr Melford Aase per chart 05/22/2015

## 2015-11-08 ENCOUNTER — Ambulatory Visit: Payer: Self-pay | Admitting: Orthopedic Surgery

## 2015-11-08 MED ORDER — DEXTROSE 5 % IV SOLN
3.0000 g | INTRAVENOUS | Status: AC
  Administered 2015-11-09: 3 g via INTRAVENOUS
  Filled 2015-11-08 (×2): qty 3000

## 2015-11-08 NOTE — H&P (Signed)
Annye Asa DOB: May 03, 1946 Married / Language: [name] / Race: Black or African American Male Date of Admission:  11/09/2015 CC:  Left Knee Pain History of Present Illness  The patient is a 70 year old male who comes in for a preoperative History and Physical. The patient is scheduled for a left total knee arthroplasty to be performed by Dr. Dione Plover. Aluisio, MD at [facility] on 11-09-2015. The patient is a 70 year old male who presented for their knee. The patient is being followed for their left knee pain and osteoarthritis. They are now year(s) out from when symptoms began. Symptoms reported include: pain. The patient feels that they are doing poorly and report their pain level to be moderate to severe. The following medication has been used for pain control: none. The patient has reported improvement of their symptoms with: Cortisone injections (& aspiration 3 years ago). The knee has gotten progressively worse over time. Pain is occurring at all times now. It is limiting what he can and cannot do. The knee does not swell much on him. He occasionally feels like it wants to get out. Left knee shows bone on bone arthritis on the lateral and patellofemoral compartments with slight valgus deformity. At this point, the most predictable means of improving pain and function is total knee arthroplasty. The procedure, risks, potential complications and rehab course are discussed in detail and the patient elects to proceed. The goals of this procedure are decreased pain and increased function. There is a high liklihood that both of these goals will be achieved. Proceed with surgery as planned He has got advanced arthritic change, left knee, significant pain and dysfunction. He is already rescheduled for total knee. They have been treated conservatively in the past for the above stated problem and despite conservative measures, they continue to have progressive pain and severe functional  limitations and dysfunction. They have failed non-operative management including home exercise, medications, and injections. It is felt that they would benefit from undergoing total joint replacement. Risks and benefits of the procedure have been discussed with the patient and they elect to proceed with surgery. There are no active contraindications to surgery such as ongoing infection or rapidly progressive neurological disease.  Problem List/Past Medical Primary osteoarthritis of left knee (M17.12)  Diabetes Mellitus, Type II  Diverticulitis Of Colon  High blood pressure  Sleep Apnea  uses CPAP Impaired Vision  Glaucoma  Urinary Incontinence  Allergies Aspirin *ANALGESICS - NonNarcotic*  ulcers  Family History Diabetes Mellitus  mother, sister and brother Drug / Alcohol Addiction  father and brother Depression  child Cancer  brother Cerebrovascular Accident  mother and sister  Social History  Marital status  married Living situation  live with spouse Illicit drug use  no Tobacco use  former smoker; smoke(d) 1 pack(s) per day Pain Contract  no Number of flights of stairs before winded  2-3 Current work status  retired Children  2 Alcohol use  current drinker; drinks beer; only occasionally per week Exercise  Exercises monthly Drug/Alcohol Rehab (Previously)  yes Drug/Alcohol Rehab (Currently)  no Post-Surgical Plans  Home following the Left TKA to be preformed on 11/09/2015  Medication History Amlodipine Besy-Benazepril HCl (Oral) Specific strength unknown - Active. Azopt (Ophthalmic) Specific strength unknown - Active. BD Pen Needle Nano U/F Specific strength unknown - Active. Combigan (Ophthalmic) Specific strength unknown - Active. Gabapentin (Oral) Specific strength unknown - Active. HumaLOG KwikPen (Subcutaneous) Specific strength unknown - Active. Invokana (Oral) Specific strength  unknown - Active. Lantus SoloStar (Subcutaneous)  Specific strength unknown - Active. Lotemax (Ophthalmic) Specific strength unknown - Active. Lumigan (Ophthalmic) Specific strength unknown - Active. Metoprolol Succinate ER (Oral) Specific strength unknown - Active. OneTouch Ultra Blue (In Vitro) Specific strength unknown - Active. OneTouch UltraSoft Lancets Specific strength unknown - Active. Simvastatin (Oral) Specific strength unknown - Active. Spironolactone (Oral) Specific strength unknown - Active. Torsemide (Oral) Specific strength unknown - Active. Victoza (Subcutaneous) Specific strength unknown - Active. Myrbetriq (25MG  Tablet ER 24HR, Oral) Active.  Past Surgical History  No pertinent past surgical history     Review of Systems  General Not Present- Chills, Fatigue, Fever, Memory Loss, Night Sweats, Weight Gain and Weight Loss. Skin Not Present- Eczema, Hives, Itching, Lesions and Rash. HEENT Not Present- Dentures, Double Vision, Headache, Hearing Loss, Tinnitus and Visual Loss. Respiratory Not Present- Allergies, Chronic Cough, Coughing up blood, Shortness of breath at rest and Shortness of breath with exertion. Cardiovascular Not Present- Chest Pain, Difficulty Breathing Lying Down, Murmur, Palpitations, Racing/skipping heartbeats and Swelling. Gastrointestinal Not Present- Abdominal Pain, Bloody Stool, Constipation, Diarrhea, Difficulty Swallowing, Heartburn, Jaundice, Loss of appetitie, Nausea and Vomiting. Male Genitourinary Present- Incontinence. Not Present- Blood in Urine, Discharge, Flank Pain, Painful Urination, Urgency, Urinary frequency, Urinary Retention, Urinating at Night and Weak urinary stream. Musculoskeletal Present- Joint Swelling. Not Present- Back Pain, Joint Pain, Morning Stiffness, Muscle Pain, Muscle Weakness and Spasms. Neurological Not Present- Blackout spells, Difficulty with balance, Dizziness, Paralysis, Tremor and Weakness. Psychiatric Not Present- Insomnia.  Vitals  Pulse: 76  (Regular)  BP: 128/62 (Sitting, Right Arm, Standard)   Physical Exam General Mental Status -Alert, cooperative and good historian. General Appearance-pleasant, Not in acute distress. Orientation-Oriented X3. Build & Nutrition-Well nourished and Well developed.  Head and Neck Head-normocephalic, atraumatic . Neck Global Assessment - supple, no bruit auscultated on the right, no bruit auscultated on the left.  Eye Vision-Wears corrective lenses. Pupil - Bilateral-Regular and Round. Motion - Bilateral-EOMI.  Chest and Lung Exam Auscultation Breath sounds - clear at anterior chest wall and clear at posterior chest wall. Adventitious sounds - No Adventitious sounds.  Cardiovascular Auscultation Rhythm - Regular rate and rhythm. Heart Sounds - S1 WNL and S2 WNL. Murmurs & Other Heart Sounds - Auscultation of the heart reveals - No Murmurs.  Abdomen Palpation/Percussion Tenderness - Abdomen is non-tender to palpation. Rigidity (guarding) - Abdomen is soft. Auscultation Auscultation of the abdomen reveals - Bowel sounds normal.  Male Genitourinary Note: Not done, not pertinent to present illness   Musculoskeletal Note: Well developed male, alert and oriented, in no apparent distress. His left hip shows normal range of motion with no discomfort. His left knee shows no effusion. He does have a valgus deformity to left knee of about 10 degrees. He is tender laterally. There is some medial tenderness also. There is no instability noted about the left knee. Right knee exam range 0 to 125, slight crepitus on range of motion. No definitive tenderness or instability.  RADIOGRAPHS Reviewed the x-rays today. Left knee shows bone on bone arthritis on the lateral and patellofemoral compartments with slight valgus deformity.  Assessment & Plan Primary osteoarthritis of left knee (Principal Diagnosis) (M17.12)  Note:Surgical Plans: Left Total Knee  Replacement  Disposition: Home  PCP: Dr. Delrae Rend  IV TXA  Anesthesia Issues: None  Signed electronically by Ok Edwards, III PA-C

## 2015-11-09 ENCOUNTER — Encounter (HOSPITAL_COMMUNITY): Payer: Self-pay | Admitting: *Deleted

## 2015-11-09 ENCOUNTER — Inpatient Hospital Stay (HOSPITAL_COMMUNITY): Payer: Medicare Other | Admitting: Certified Registered Nurse Anesthetist

## 2015-11-09 ENCOUNTER — Encounter (HOSPITAL_COMMUNITY)
Admission: RE | Disposition: A | Discharge status: Home Health | Payer: Self-pay | Source: Ambulatory Visit | Attending: Orthopedic Surgery

## 2015-11-09 ENCOUNTER — Inpatient Hospital Stay (HOSPITAL_COMMUNITY)
Admission: RE | Admit: 2015-11-09 | Discharge: 2015-11-11 | DRG: 470 | Disposition: A | Discharge status: Home Health | Payer: Medicare Other | Source: Ambulatory Visit | Attending: Orthopedic Surgery | Admitting: Orthopedic Surgery

## 2015-11-09 DIAGNOSIS — Z79899 Other long term (current) drug therapy: Secondary | ICD-10-CM | POA: Diagnosis not present

## 2015-11-09 DIAGNOSIS — Z01812 Encounter for preprocedural laboratory examination: Secondary | ICD-10-CM | POA: Diagnosis not present

## 2015-11-09 DIAGNOSIS — M179 Osteoarthritis of knee, unspecified: Secondary | ICD-10-CM | POA: Diagnosis present

## 2015-11-09 DIAGNOSIS — Z87891 Personal history of nicotine dependence: Secondary | ICD-10-CM | POA: Diagnosis not present

## 2015-11-09 DIAGNOSIS — E785 Hyperlipidemia, unspecified: Secondary | ICD-10-CM | POA: Diagnosis present

## 2015-11-09 DIAGNOSIS — E1122 Type 2 diabetes mellitus with diabetic chronic kidney disease: Secondary | ICD-10-CM | POA: Diagnosis present

## 2015-11-09 DIAGNOSIS — N189 Chronic kidney disease, unspecified: Secondary | ICD-10-CM | POA: Diagnosis present

## 2015-11-09 DIAGNOSIS — Z794 Long term (current) use of insulin: Secondary | ICD-10-CM

## 2015-11-09 DIAGNOSIS — M1712 Unilateral primary osteoarthritis, left knee: Secondary | ICD-10-CM | POA: Diagnosis present

## 2015-11-09 DIAGNOSIS — G4733 Obstructive sleep apnea (adult) (pediatric): Secondary | ICD-10-CM | POA: Diagnosis present

## 2015-11-09 DIAGNOSIS — I129 Hypertensive chronic kidney disease with stage 1 through stage 4 chronic kidney disease, or unspecified chronic kidney disease: Secondary | ICD-10-CM | POA: Diagnosis present

## 2015-11-09 DIAGNOSIS — G629 Polyneuropathy, unspecified: Secondary | ICD-10-CM | POA: Diagnosis present

## 2015-11-09 DIAGNOSIS — M25562 Pain in left knee: Secondary | ICD-10-CM | POA: Diagnosis present

## 2015-11-09 DIAGNOSIS — H409 Unspecified glaucoma: Secondary | ICD-10-CM | POA: Diagnosis present

## 2015-11-09 DIAGNOSIS — M1710 Unilateral primary osteoarthritis, unspecified knee: Secondary | ICD-10-CM | POA: Diagnosis present

## 2015-11-09 DIAGNOSIS — H903 Sensorineural hearing loss, bilateral: Secondary | ICD-10-CM | POA: Diagnosis present

## 2015-11-09 DIAGNOSIS — Z7984 Long term (current) use of oral hypoglycemic drugs: Secondary | ICD-10-CM

## 2015-11-09 DIAGNOSIS — Z833 Family history of diabetes mellitus: Secondary | ICD-10-CM

## 2015-11-09 DIAGNOSIS — R32 Unspecified urinary incontinence: Secondary | ICD-10-CM | POA: Diagnosis present

## 2015-11-09 DIAGNOSIS — Z6839 Body mass index (BMI) 39.0-39.9, adult: Secondary | ICD-10-CM

## 2015-11-09 HISTORY — PX: TOTAL KNEE ARTHROPLASTY: SHX125

## 2015-11-09 LAB — GLUCOSE, CAPILLARY
GLUCOSE-CAPILLARY: 113 mg/dL — AB (ref 65–99)
GLUCOSE-CAPILLARY: 127 mg/dL — AB (ref 65–99)
GLUCOSE-CAPILLARY: 397 mg/dL — AB (ref 65–99)
Glucose-Capillary: 233 mg/dL — ABNORMAL HIGH (ref 65–99)
Glucose-Capillary: 329 mg/dL — ABNORMAL HIGH (ref 65–99)

## 2015-11-09 LAB — TYPE AND SCREEN
ABO/RH(D): O POS
ANTIBODY SCREEN: NEGATIVE

## 2015-11-09 SURGERY — ARTHROPLASTY, KNEE, TOTAL
Anesthesia: Spinal | Site: Knee | Laterality: Left

## 2015-11-09 MED ORDER — INSULIN ASPART 100 UNIT/ML ~~LOC~~ SOLN: 0.0000 [IU] | Freq: Three times a day (TID) | SUBCUTANEOUS | Status: DC

## 2015-11-09 MED ORDER — ACETAMINOPHEN 325 MG PO TABS: 650.0000 mg | ORAL_TABLET | Freq: Four times a day (QID) | ORAL | Status: DC | PRN

## 2015-11-09 MED ORDER — FLEET ENEMA 7-19 GM/118ML RE ENEM: 1.0000 | ENEMA | Freq: Once | RECTAL | Status: DC | PRN

## 2015-11-09 MED ORDER — MIDAZOLAM HCL 2 MG/2ML IJ SOLN
INTRAMUSCULAR | Status: AC
  Filled 2015-11-09: qty 2

## 2015-11-09 MED ORDER — METOPROLOL SUCCINATE ER 100 MG PO TB24
100.0000 mg | ORAL_TABLET | Freq: Every day | ORAL | Status: DC
  Administered 2015-11-10 – 2015-11-11 (×2): 100 mg via ORAL
  Filled 2015-11-09 (×2): qty 1

## 2015-11-09 MED ORDER — LACTATED RINGERS IV SOLN
INTRAVENOUS | Status: DC | PRN
  Administered 2015-11-09 (×2): via INTRAVENOUS

## 2015-11-09 MED ORDER — MIRABEGRON ER 25 MG PO TB24
25.0000 mg | ORAL_TABLET | Freq: Every day | ORAL | Status: DC
  Administered 2015-11-09 – 2015-11-11 (×3): 25 mg via ORAL
  Filled 2015-11-09 (×3): qty 1

## 2015-11-09 MED ORDER — INSULIN ASPART 100 UNIT/ML ~~LOC~~ SOLN: 25.0000 [IU] | Freq: Three times a day (TID) | SUBCUTANEOUS | Status: DC

## 2015-11-09 MED ORDER — BUPIVACAINE LIPOSOME 1.3 % IJ SUSP
20.0000 mL | Freq: Once | INTRAMUSCULAR | Status: DC
  Filled 2015-11-09: qty 20

## 2015-11-09 MED ORDER — ONDANSETRON HCL 4 MG PO TABS: 4.0000 mg | ORAL_TABLET | Freq: Four times a day (QID) | ORAL | Status: DC | PRN

## 2015-11-09 MED ORDER — METHOCARBAMOL 1000 MG/10ML IJ SOLN
500.0000 mg | Freq: Four times a day (QID) | INTRAMUSCULAR | Status: DC | PRN
  Administered 2015-11-09 (×2): 500 mg via INTRAVENOUS
  Filled 2015-11-09 (×3): qty 5

## 2015-11-09 MED ORDER — SODIUM CHLORIDE 0.9 % IV SOLN: INTRAVENOUS | Status: DC

## 2015-11-09 MED ORDER — MORPHINE SULFATE (PF) 2 MG/ML IV SOLN
1.0000 mg | INTRAVENOUS | Status: DC | PRN
  Administered 2015-11-09 (×2): 1 mg via INTRAVENOUS
  Filled 2015-11-09 (×2): qty 1

## 2015-11-09 MED ORDER — ACETAMINOPHEN 10 MG/ML IV SOLN
1000.0000 mg | Freq: Once | INTRAVENOUS | Status: AC
  Administered 2015-11-09: 1000 mg via INTRAVENOUS

## 2015-11-09 MED ORDER — PROPOFOL 500 MG/50ML IV EMUL
INTRAVENOUS | Status: DC | PRN
  Administered 2015-11-09: 25 ug/kg/min via INTRAVENOUS

## 2015-11-09 MED ORDER — TIMOLOL MALEATE 0.5 % OP SOLN
1.0000 [drp] | Freq: Two times a day (BID) | OPHTHALMIC | Status: DC
  Administered 2015-11-09 – 2015-11-11 (×4): 1 [drp] via OPHTHALMIC
  Filled 2015-11-09: qty 5

## 2015-11-09 MED ORDER — ACETAMINOPHEN 500 MG PO TABS
1000.0000 mg | ORAL_TABLET | Freq: Four times a day (QID) | ORAL | Status: AC
  Administered 2015-11-09 – 2015-11-10 (×3): 1000 mg via ORAL
  Filled 2015-11-09 (×4): qty 2

## 2015-11-09 MED ORDER — TORSEMIDE 20 MG PO TABS
20.0000 mg | ORAL_TABLET | Freq: Once | ORAL | Status: AC
  Administered 2015-11-10: 20 mg via ORAL
  Filled 2015-11-09: qty 1

## 2015-11-09 MED ORDER — INSULIN GLARGINE 100 UNIT/ML ~~LOC~~ SOLN
65.0000 [IU] | Freq: Every day | SUBCUTANEOUS | Status: DC
  Administered 2015-11-10 – 2015-11-11 (×2): 65 [IU] via SUBCUTANEOUS
  Filled 2015-11-09 (×2): qty 0.65

## 2015-11-09 MED ORDER — ONDANSETRON HCL 4 MG/2ML IJ SOLN: 4.0000 mg | Freq: Four times a day (QID) | INTRAMUSCULAR | Status: DC | PRN

## 2015-11-09 MED ORDER — BRIMONIDINE TARTRATE 0.2 % OP SOLN
1.0000 [drp] | Freq: Two times a day (BID) | OPHTHALMIC | Status: DC
  Administered 2015-11-09 – 2015-11-11 (×4): 1 [drp] via OPHTHALMIC
  Filled 2015-11-09: qty 5

## 2015-11-09 MED ORDER — STERILE WATER FOR IRRIGATION IR SOLN
Status: DC | PRN
  Administered 2015-11-09: 2000 mL

## 2015-11-09 MED ORDER — LIRAGLUTIDE 18 MG/3ML ~~LOC~~ SOPN
1.8000 mg | PEN_INJECTOR | Freq: Every day | SUBCUTANEOUS | Status: DC
  Administered 2015-11-10 – 2015-11-11 (×2): 1.8 mg via SUBCUTANEOUS

## 2015-11-09 MED ORDER — 0.9 % SODIUM CHLORIDE (POUR BTL) OPTIME
TOPICAL | Status: DC | PRN
  Administered 2015-11-09: 1000 mL

## 2015-11-09 MED ORDER — SIMVASTATIN 20 MG PO TABS
20.0000 mg | ORAL_TABLET | Freq: Every day | ORAL | Status: DC
  Administered 2015-11-09 – 2015-11-10 (×2): 20 mg via ORAL
  Filled 2015-11-09 (×3): qty 1

## 2015-11-09 MED ORDER — ACETAMINOPHEN 10 MG/ML IV SOLN
INTRAVENOUS | Status: AC
  Filled 2015-11-09: qty 100

## 2015-11-09 MED ORDER — PHENOL 1.4 % MT LIQD: 1.0000 | OROMUCOSAL | Status: DC | PRN

## 2015-11-09 MED ORDER — METOCLOPRAMIDE HCL 5 MG/ML IJ SOLN: 10.0000 mg | Freq: Once | INTRAMUSCULAR | Status: DC | PRN

## 2015-11-09 MED ORDER — SODIUM CHLORIDE 0.9 % IV SOLN
INTRAVENOUS | Status: DC
  Administered 2015-11-09: 11:00:00 via INTRAVENOUS

## 2015-11-09 MED ORDER — MIDAZOLAM HCL 5 MG/5ML IJ SOLN
INTRAMUSCULAR | Status: DC | PRN
  Administered 2015-11-09: 2 mg via INTRAVENOUS

## 2015-11-09 MED ORDER — CHLORHEXIDINE GLUCONATE 4 % EX LIQD: 60.0000 mL | Freq: Once | CUTANEOUS | Status: DC

## 2015-11-09 MED ORDER — SODIUM CHLORIDE 0.9 % IR SOLN
Status: DC | PRN
  Administered 2015-11-09: 1000 mL

## 2015-11-09 MED ORDER — METOCLOPRAMIDE HCL 10 MG PO TABS: 5.0000 mg | ORAL_TABLET | Freq: Three times a day (TID) | ORAL | Status: DC | PRN

## 2015-11-09 MED ORDER — SODIUM CHLORIDE 0.9 % IJ SOLN
INTRAMUSCULAR | Status: AC
  Filled 2015-11-09: qty 50

## 2015-11-09 MED ORDER — RIVAROXABAN 10 MG PO TABS
10.0000 mg | ORAL_TABLET | Freq: Every day | ORAL | Status: DC
  Administered 2015-11-10 – 2015-11-11 (×2): 10 mg via ORAL
  Filled 2015-11-09 (×3): qty 1

## 2015-11-09 MED ORDER — BUPIVACAINE LIPOSOME 1.3 % IJ SUSP
INTRAMUSCULAR | Status: DC | PRN
  Administered 2015-11-09: 20 mL

## 2015-11-09 MED ORDER — TRAMADOL HCL 50 MG PO TABS: 50.0000 mg | ORAL_TABLET | Freq: Four times a day (QID) | ORAL | Status: DC | PRN

## 2015-11-09 MED ORDER — POLYETHYLENE GLYCOL 3350 17 G PO PACK: 17.0000 g | PACK | Freq: Every day | ORAL | Status: DC | PRN

## 2015-11-09 MED ORDER — ACETAMINOPHEN 650 MG RE SUPP: 650.0000 mg | Freq: Four times a day (QID) | RECTAL | Status: DC | PRN

## 2015-11-09 MED ORDER — LACTATED RINGERS IV SOLN: INTRAVENOUS | Status: DC

## 2015-11-09 MED ORDER — AMLODIPINE BESYLATE 10 MG PO TABS
10.0000 mg | ORAL_TABLET | Freq: Once | ORAL | Status: AC
  Administered 2015-11-09: 10 mg via ORAL
  Filled 2015-11-09: qty 1

## 2015-11-09 MED ORDER — CANAGLIFLOZIN 100 MG PO TABS
100.0000 mg | ORAL_TABLET | Freq: Every day | ORAL | Status: DC
  Administered 2015-11-10 – 2015-11-11 (×2): 100 mg via ORAL
  Filled 2015-11-09 (×2): qty 1

## 2015-11-09 MED ORDER — MEPERIDINE HCL 50 MG/ML IJ SOLN: 6.2500 mg | INTRAMUSCULAR | Status: DC | PRN

## 2015-11-09 MED ORDER — MENTHOL 3 MG MT LOZG: 1.0000 | LOZENGE | OROMUCOSAL | Status: DC | PRN

## 2015-11-09 MED ORDER — PHENYLEPHRINE HCL 10 MG/ML IJ SOLN
INTRAMUSCULAR | Status: DC | PRN
  Administered 2015-11-09: 100 ug via INTRAVENOUS

## 2015-11-09 MED ORDER — BUPIVACAINE HCL (PF) 0.75 % IJ SOLN
INTRAMUSCULAR | Status: DC | PRN
  Administered 2015-11-09: 1.8 mL via INTRATHECAL

## 2015-11-09 MED ORDER — FENTANYL CITRATE (PF) 100 MCG/2ML IJ SOLN
INTRAMUSCULAR | Status: DC | PRN
  Administered 2015-11-09: 100 ug via INTRAVENOUS

## 2015-11-09 MED ORDER — BUPIVACAINE HCL (PF) 0.25 % IJ SOLN
INTRAMUSCULAR | Status: AC
Start: 2015-11-09 — End: 2015-11-09
  Filled 2015-11-09: qty 30

## 2015-11-09 MED ORDER — BUPIVACAINE HCL 0.25 % IJ SOLN
INTRAMUSCULAR | Status: DC | PRN
  Administered 2015-11-09: 20 mL

## 2015-11-09 MED ORDER — FENTANYL CITRATE (PF) 100 MCG/2ML IJ SOLN: 25.0000 ug | INTRAMUSCULAR | Status: DC | PRN

## 2015-11-09 MED ORDER — INSULIN ASPART 100 UNIT/ML ~~LOC~~ SOLN
0.0000 [IU] | Freq: Three times a day (TID) | SUBCUTANEOUS | Status: DC
  Administered 2015-11-09: 11 [IU] via SUBCUTANEOUS
  Administered 2015-11-09: 5 [IU] via SUBCUTANEOUS
  Administered 2015-11-10: 8 [IU] via SUBCUTANEOUS
  Administered 2015-11-10: 5 [IU] via SUBCUTANEOUS
  Administered 2015-11-10 – 2015-11-11 (×2): 2 [IU] via SUBCUTANEOUS
  Administered 2015-11-11: 3 [IU] via SUBCUTANEOUS

## 2015-11-09 MED ORDER — METHOCARBAMOL 500 MG PO TABS
500.0000 mg | ORAL_TABLET | Freq: Four times a day (QID) | ORAL | Status: DC | PRN
  Administered 2015-11-09 – 2015-11-11 (×3): 500 mg via ORAL
  Filled 2015-11-09 (×4): qty 1

## 2015-11-09 MED ORDER — INSULIN ASPART 100 UNIT/ML ~~LOC~~ SOLN: 0.0000 [IU] | Freq: Every day | SUBCUTANEOUS | Status: DC

## 2015-11-09 MED ORDER — PROPOFOL 500 MG/50ML IV EMUL
INTRAVENOUS | Status: DC | PRN
  Administered 2015-11-09: 50 mg via INTRAVENOUS

## 2015-11-09 MED ORDER — DEXAMETHASONE SODIUM PHOSPHATE 10 MG/ML IJ SOLN
10.0000 mg | Freq: Once | INTRAMUSCULAR | Status: AC
  Administered 2015-11-10: 10 mg via INTRAVENOUS
  Filled 2015-11-09: qty 1

## 2015-11-09 MED ORDER — BRINZOLAMIDE 1 % OP SUSP
1.0000 [drp] | Freq: Every day | OPHTHALMIC | Status: DC
  Administered 2015-11-09 – 2015-11-11 (×3): 1 [drp] via OPHTHALMIC
  Filled 2015-11-09: qty 10

## 2015-11-09 MED ORDER — LATANOPROST 0.005 % OP SOLN
1.0000 [drp] | Freq: Every day | OPHTHALMIC | Status: DC
Start: 2015-11-09 — End: 2015-11-11
  Administered 2015-11-09 – 2015-11-10 (×2): 1 [drp] via OPHTHALMIC
  Filled 2015-11-09: qty 2.5

## 2015-11-09 MED ORDER — SODIUM CHLORIDE 0.9 % IJ SOLN
INTRAMUSCULAR | Status: DC | PRN
  Administered 2015-11-09: 30 mL

## 2015-11-09 MED ORDER — SPIRONOLACTONE 25 MG PO TABS
25.0000 mg | ORAL_TABLET | Freq: Every day | ORAL | Status: DC
  Administered 2015-11-10 – 2015-11-11 (×2): 25 mg via ORAL
  Filled 2015-11-09 (×2): qty 1

## 2015-11-09 MED ORDER — TRANEXAMIC ACID 1000 MG/10ML IV SOLN
1000.0000 mg | INTRAVENOUS | Status: AC
  Administered 2015-11-09: 1000 mg via INTRAVENOUS
  Filled 2015-11-09: qty 10

## 2015-11-09 MED ORDER — DEXAMETHASONE SODIUM PHOSPHATE 10 MG/ML IJ SOLN
10.0000 mg | Freq: Once | INTRAMUSCULAR | Status: AC
  Administered 2015-11-09: 10 mg via INTRAVENOUS

## 2015-11-09 MED ORDER — GABAPENTIN 300 MG PO CAPS
300.0000 mg | ORAL_CAPSULE | Freq: Two times a day (BID) | ORAL | Status: DC
  Administered 2015-11-09 – 2015-11-11 (×4): 300 mg via ORAL
  Filled 2015-11-09 (×5): qty 1

## 2015-11-09 MED ORDER — DOCUSATE SODIUM 100 MG PO CAPS
100.0000 mg | ORAL_CAPSULE | Freq: Two times a day (BID) | ORAL | Status: DC
  Administered 2015-11-09 – 2015-11-11 (×4): 100 mg via ORAL

## 2015-11-09 MED ORDER — CEFAZOLIN SODIUM-DEXTROSE 2-4 GM/100ML-% IV SOLN
2.0000 g | Freq: Four times a day (QID) | INTRAVENOUS | Status: AC
  Administered 2015-11-09 (×2): 2 g via INTRAVENOUS
  Filled 2015-11-09 (×2): qty 100

## 2015-11-09 MED ORDER — TRANEXAMIC ACID 1000 MG/10ML IV SOLN
1000.0000 mg | Freq: Once | INTRAVENOUS | Status: AC
  Administered 2015-11-09: 1000 mg via INTRAVENOUS
  Filled 2015-11-09: qty 10

## 2015-11-09 MED ORDER — BRIMONIDINE TARTRATE-TIMOLOL 0.2-0.5 % OP SOLN: 1.0000 [drp] | Freq: Two times a day (BID) | OPHTHALMIC | Status: DC

## 2015-11-09 MED ORDER — INSULIN ASPART 100 UNIT/ML ~~LOC~~ SOLN: 30.0000 [IU] | Freq: Three times a day (TID) | SUBCUTANEOUS | Status: DC

## 2015-11-09 MED ORDER — OXYCODONE HCL 5 MG PO TABS
5.0000 mg | ORAL_TABLET | ORAL | Status: DC | PRN
  Administered 2015-11-09 – 2015-11-11 (×7): 10 mg via ORAL
  Filled 2015-11-09 (×7): qty 2

## 2015-11-09 MED ORDER — DIPHENHYDRAMINE HCL 12.5 MG/5ML PO ELIX: 12.5000 mg | ORAL_SOLUTION | ORAL | Status: DC | PRN

## 2015-11-09 MED ORDER — INSULIN ASPART 100 UNIT/ML ~~LOC~~ SOLN
15.0000 [IU] | Freq: Once | SUBCUTANEOUS | Status: AC
  Administered 2015-11-09: 15 [IU] via SUBCUTANEOUS

## 2015-11-09 MED ORDER — METOCLOPRAMIDE HCL 5 MG/ML IJ SOLN: 5.0000 mg | Freq: Three times a day (TID) | INTRAMUSCULAR | Status: DC | PRN

## 2015-11-09 MED ORDER — PROPOFOL 10 MG/ML IV BOLUS
INTRAVENOUS | Status: AC
  Filled 2015-11-09: qty 60

## 2015-11-09 MED ORDER — BISACODYL 10 MG RE SUPP
10.0000 mg | Freq: Every day | RECTAL | Status: DC | PRN
Start: 2015-11-09 — End: 2015-11-11

## 2015-11-09 MED ORDER — FENTANYL CITRATE (PF) 100 MCG/2ML IJ SOLN
INTRAMUSCULAR | Status: AC
  Filled 2015-11-09: qty 2

## 2015-11-09 SURGICAL SUPPLY — 48 items
BAG SPEC THK2 15X12 ZIP CLS (MISCELLANEOUS) ×1
BAG ZIPLOCK 12X15 (MISCELLANEOUS) ×2 IMPLANT
BANDAGE ACE 6X5 VEL STRL LF (GAUZE/BANDAGES/DRESSINGS) ×2 IMPLANT
BLADE SAG 18X100X1.27 (BLADE) ×2 IMPLANT
BLADE SAW SGTL 11.0X1.19X90.0M (BLADE) ×2 IMPLANT
BOWL SMART MIX CTS (DISPOSABLE) ×2 IMPLANT
CAPT KNEE TOTAL 3 ATTUNE ×1 IMPLANT
CEMENT HV SMART SET (Cement) ×4 IMPLANT
CLOTH BEACON ORANGE TIMEOUT ST (SAFETY) ×2 IMPLANT
CUFF TOURN SGL QUICK 34 (TOURNIQUET CUFF) ×2
CUFF TRNQT CYL 34X4X40X1 (TOURNIQUET CUFF) ×1 IMPLANT
DECANTER SPIKE VIAL GLASS SM (MISCELLANEOUS) ×2 IMPLANT
DRAPE U-SHAPE 47X51 STRL (DRAPES) ×2 IMPLANT
DRSG ADAPTIC 3X8 NADH LF (GAUZE/BANDAGES/DRESSINGS) ×2 IMPLANT
DRSG PAD ABDOMINAL 8X10 ST (GAUZE/BANDAGES/DRESSINGS) ×2 IMPLANT
DURAPREP 26ML APPLICATOR (WOUND CARE) ×2 IMPLANT
ELECT REM PT RETURN 9FT ADLT (ELECTROSURGICAL) ×2
ELECTRODE REM PT RTRN 9FT ADLT (ELECTROSURGICAL) ×1 IMPLANT
EVACUATOR 1/8 PVC DRAIN (DRAIN) ×2 IMPLANT
GAUZE SPONGE 4X4 12PLY STRL (GAUZE/BANDAGES/DRESSINGS) ×2 IMPLANT
GLOVE BIO SURGEON STRL SZ7.5 (GLOVE) IMPLANT
GLOVE BIO SURGEON STRL SZ8 (GLOVE) ×2 IMPLANT
GLOVE BIOGEL PI IND STRL 6.5 (GLOVE) IMPLANT
GLOVE BIOGEL PI IND STRL 8 (GLOVE) ×1 IMPLANT
GLOVE BIOGEL PI INDICATOR 6.5 (GLOVE)
GLOVE BIOGEL PI INDICATOR 8 (GLOVE) ×1
GLOVE SURG SS PI 6.5 STRL IVOR (GLOVE) IMPLANT
GOWN STRL REUS W/ TWL LRG LVL3 (GOWN DISPOSABLE) IMPLANT
GOWN STRL REUS W/TWL LRG LVL3 (GOWN DISPOSABLE) ×4 IMPLANT
GOWN STRL REUS W/TWL XL LVL3 (GOWN DISPOSABLE) ×2 IMPLANT
HANDPIECE INTERPULSE COAX TIP (DISPOSABLE) ×2
IMMOBILIZER KNEE 20 (SOFTGOODS) ×2
IMMOBILIZER KNEE 20 THIGH 36 (SOFTGOODS) ×1 IMPLANT
MANIFOLD NEPTUNE II (INSTRUMENTS) ×2 IMPLANT
PACK TOTAL KNEE CUSTOM (KITS) ×2 IMPLANT
PAD ABD 8X10 STRL (GAUZE/BANDAGES/DRESSINGS) ×1 IMPLANT
PADDING CAST COTTON 6X4 STRL (CAST SUPPLIES) ×4 IMPLANT
POSITIONER SURGICAL ARM (MISCELLANEOUS) ×2 IMPLANT
SET HNDPC FAN SPRY TIP SCT (DISPOSABLE) ×1 IMPLANT
STRIP CLOSURE SKIN 1/2X4 (GAUZE/BANDAGES/DRESSINGS) ×3 IMPLANT
SUT MNCRL AB 4-0 PS2 18 (SUTURE) ×2 IMPLANT
SUT VIC AB 2-0 CT1 27 (SUTURE) ×8
SUT VIC AB 2-0 CT1 TAPERPNT 27 (SUTURE) ×3 IMPLANT
SUT VLOC 180 0 24IN GS25 (SUTURE) ×2 IMPLANT
SYR 50ML LL SCALE MARK (SYRINGE) ×2 IMPLANT
TRAY FOLEY W/METER SILVER 16FR (SET/KITS/TRAYS/PACK) ×2 IMPLANT
WRAP KNEE MAXI GEL POST OP (GAUZE/BANDAGES/DRESSINGS) ×2 IMPLANT
YANKAUER SUCT BULB TIP 10FT TU (MISCELLANEOUS) ×2 IMPLANT

## 2015-11-09 NOTE — Anesthesia Procedure Notes (Signed)
Spinal Patient location during procedure: OR Staffing Anesthesiologist: Montez Hageman Performed by: anesthesiologist  Preanesthetic Checklist Completed: patient identified, site marked, surgical consent, pre-op evaluation, timeout performed, IV checked, risks and benefits discussed and monitors and equipment checked Spinal Block Patient position: sitting Prep: Betadine Patient monitoring: heart rate, continuous pulse ox and blood pressure Approach: right paramedian Location: L4-5 Injection technique: single-shot Needle Needle type: Spinocan  Needle gauge: 22 G Needle length: 9 cm Needle insertion depth: 8.5 cm Additional Notes Expiration date of kit checked and confirmed. Patient tolerated procedure well, without complications.

## 2015-11-09 NOTE — Care Management Important Message (Signed)
Important Message  Patient Details  Name: Micheal Morris MRN: [id number] Date of Birth: 1946/03/16   Medicare Important Message Given:  Yes    [name] 11/09/2015, 11:28 AMImportant Message  Patient Details  Name: Micheal Morris MRN: [id number] Date of Birth: Jul 19, 1946   Medicare Important Message Given:  Yes    [name] 11/09/2015, 11:28 AM

## 2015-11-09 NOTE — Evaluation (Signed)
Physical Therapy Evaluation Patient Details Name: Micheal Morris MRN: [id number] DOB: [birth year] Today's Date: 11/09/2015   History of Present Illness  s/p L TKA  Clinical Impression  Pt is s/p TKA resulting in the deficits listed below (see PT Problem List).  Pt will benefit from skilled PT to increase their independence and safety with mobility to allow discharge to the venue listed below.      Follow Up Recommendations Home health PT    Equipment Recommendations  Rolling walker with 5" wheels    Recommendations for Other Services       Precautions / Restrictions Precautions Precautions: Fall;Knee Required Braces or Orthoses: Knee Immobilizer - Left Knee Immobilizer - Left: Discontinue once straight leg raise with < 10 degree lag      Mobility  Bed Mobility Overal bed mobility: Needs Assistance Bed Mobility: Supine to Sit     Supine to sit: +2 for safety/equipment;Mod assist     General bed mobility comments: assist with LLE and trunk-- pt dizzy EOB (likely d/t IV pain meds) BP 114/50   Transfers Overall transfer level: Needs assistance Equipment used: Rolling walker (2 wheeled) Transfers: Sit to/from Stand Sit to Stand: Min assist;+2 physical assistance;+2 safety/equipment;Mod assist;From elevated surface         General transfer comment: cues for hand placement and safety  Ambulation/Gait Ambulation/Gait assistance: Min assist;+2 safety/equipment Ambulation Distance (Feet): 11 Feet Assistive device: Rolling walker (2 wheeled) Gait Pattern/deviations: Step-to pattern     General Gait Details: cues for  sequence, RW position; BP 117/53, pt slightly dizzy and mildly diaphoretic, no LOC  Stairs            Wheelchair Mobility    Modified Rankin (Stroke Patients Only)       Balance Overall balance assessment: Needs assistance           Standing balance-Leahy Scale: Poor Standing balance comment: reliant on UEs to maintain balance                              Pertinent Vitals/Pain Pain Assessment: 0-10 Pain Score: 3  Pain Location: L knee Pain Descriptors / Indicators: Sore Pain Intervention(s): Limited activity within patient's tolerance;Monitored during session;Premedicated before session;Ice applied    Home Living Family/patient expects to be discharged to:: Private residence Living Arrangements: Spouse/significant other   Type of Home: House Home Access: Stairs to enter Entrance Stairs-Rails: None Entrance Stairs-Number of Steps: 1 Home Layout: Two level;1/2 bath on main level Home Equipment: None Additional Comments: pt planning to stay downstairs, requests hospital bed    Prior Function Level of Independence: Independent               Hand Dominance        Extremity/Trunk Assessment   Upper Extremity Assessment: Overall WFL for tasks assessed           Lower Extremity Assessment: LLE deficits/detail   LLE Deficits / Details: ankle WFL, knee extension 2 to  2+/5, hip flexion 2+/5     Communication   Communication: No difficulties  Cognition Arousal/Alertness: Awake/alert Behavior During Therapy: WFL for tasks assessed/performed (sleepy d/t meds) Overall Cognitive Status: Within Functional Limits for tasks assessed                      General Comments      Exercises Total Joint Exercises Ankle Circles/Pumps: AROM;Both;10 reps      Assessment/Plan  PT Assessment    PT Diagnosis Difficulty walking   PT Problem List    PT Treatment Interventions     PT Goals (Current goals can be found in the Care Plan section) Acute Rehab PT Goals Patient Stated Goal: return to I PT Goal Formulation: With patient Time For Goal Achievement: 11/13/15 Potential to Achieve Goals: Good    Frequency     Barriers to discharge        Co-evaluation               End of Session Equipment Utilized During Treatment: Gait belt;Left knee immobilizer Activity  Tolerance: Patient tolerated treatment well Patient left: with call bell/phone within reach;in chair;with chair alarm set;with family/visitor present           Time: 1635 (and 1612-1620)-1656 PT Time Calculation (min) (ACUTE ONLY): 21 min   Charges:   PT Evaluation $PT Eval Low Complexity: 1 Procedure PT Treatments $Gait Training: 8-22 mins   PT G Codes:        Virgene Tirone 11-28-15, 5:12 PM

## 2015-11-09 NOTE — Op Note (Signed)
Pre-operative diagnosis- Osteoarthritis  Left knee(s)  Post-operative diagnosis- Osteoarthritis Left knee(s)  Procedure-  Left  Total Knee Arthroplasty  Surgeon- Dione Plover. Cleona Doubleday, MD  Assistant- Ardeen Jourdain, PA-C   Anesthesia-  Spinal  EBL-* No blood loss amount entered *   Drains Hemovac  Tourniquet time-  Total Tourniquet Time Documented: Thigh (Left) - 39 minutes Total: Thigh (Left) - 39 minutes     Complications- None  Condition-PACU - hemodynamically stable.   Brief Clinical Note   Micheal Morris is a 70 y.o. year old male with end stage OA of his left knee with progressively worsening pain and dysfunction. He has constant pain, with activity and at rest and significant functional deficits with difficulties even with ADLs. He has had extensive non-op management including analgesics, injections of cortisone and viscosupplements, and home exercise program, but remains in significant pain with significant dysfunction. Radiographs show bone on bone arthritis lateral and patelofemoral. He presents now for left Total Knee Arthroplasty.     Procedure in detail---   The patient is brought into the operating room and positioned supine on the operating table. After successful administration of  Spinal,   a tourniquet is placed high on the  Left thigh(s) and the lower extremity is prepped and draped in the usual sterile fashion. Time out is performed by the operating team and then the  Left lower extremity is wrapped in Esmarch, knee flexed and the tourniquet inflated to 300 mmHg.       A midline incision is made with a ten blade through the subcutaneous tissue to the level of the extensor mechanism. A fresh blade is used to make a medial parapatellar arthrotomy. Soft tissue over the proximal medial tibia is subperiosteally elevated to the joint line with a knife and into the semimembranosus bursa with a Cobb elevator. Soft tissue over the proximal lateral tibia is elevated with  attention being paid to avoiding the patellar tendon on the tibial tubercle. The patella is everted, knee flexed 90 degrees and the ACL and PCL are removed. Findings are bone on bone lateral and patellofemoral with massive global osteophytes.        The drill is used to create a starting hole in the distal femur and the canal is thoroughly irrigated with sterile saline to remove the fatty contents. The 5 degree Left  valgus alignment guide is placed into the femoral canal and the distal femoral cutting block is pinned to remove 10 mm off the distal femur. Resection is made with an oscillating saw.      The tibia is subluxed forward and the menisci are removed. The extramedullary alignment guide is placed referencing proximally at the medial aspect of the tibial tubercle and distally along the second metatarsal axis and tibial crest. The block is pinned to remove 51mm off the more deficient lateral  side. Resection is made with an oscillating saw. Size 7is the most appropriate size for the tibia and the proximal tibia is prepared with the modular drill and keel punch for that size.      The femoral sizing guide is placed and size 6 is most appropriate. Rotation is marked off the epicondylar axis and confirmed by creating a rectangular flexion gap at 90 degrees. The size 6 cutting block is pinned in this rotation and the anterior, posterior and chamfer cuts are made with the oscillating saw. The intercondylar block is then placed and that cut is made.      Trial size 7  tibial component, trial size 6 posterior stabilized femur and a 12  mm posterior stabilized rotating platform insert trial is placed. Full extension is achieved with excellent varus/valgus and anterior/posterior balance throughout full range of motion. The patella is everted and thickness measured to be 27  mm. Free hand resection is taken to 15 mm, a 41 template is placed, lug holes are drilled, trial patella is placed, and it tracks normally.  Osteophytes are removed off the posterior femur with the trial in place. All trials are removed and the cut bone surfaces prepared with pulsatile lavage. Cement is mixed and once ready for implantation, the size 7 tibial implant, size  6 posterior stabilized femoral component, and the size 41 patella are cemented in place and the patella is held with the clamp. The trial insert is placed and the knee held in full extension. The Exparel (20 ml mixed with 30 ml saline) and .25% Bupivicaine, are injected into the extensor mechanism, posterior capsule, medial and lateral gutters and subcutaneous tissues.  All extruded cement is removed and once the cement is hard the permanent 12 mm posterior stabilized rotating platform insert is placed into the tibial tray.      The wound is copiously irrigated with saline solution and the extensor mechanism closed over a hemovac drain with #1 V-loc suture. The tourniquet is released for a total tourniquet time of 39  minutes. Flexion against gravity is 140 degrees and the patella tracks normally. Subcutaneous tissue is closed with 2.0 vicryl and subcuticular with running 4.0 Monocryl. The incision is cleaned and dried and steri-strips and a bulky sterile dressing are applied. The limb is placed into a knee immobilizer and the patient is awakened and transported to recovery in stable condition.      Please note that a surgical assistant was a medical necessity for this procedure in order to perform it in a safe and expeditious manner. Surgical assistant was necessary to retract the ligaments and vital neurovascular structures to prevent injury to them and also necessary for proper positioning of the limb to allow for anatomic placement of the prosthesis.   Dione Plover [name], MD    11/09/2015, 8:30 AM

## 2015-11-09 NOTE — Anesthesia Preprocedure Evaluation (Addendum)
Anesthesia Evaluation  Patient identified by MRN, date of birth, ID band Patient awake    Reviewed: Allergy & Precautions, NPO status , Patient's Chart, lab work & pertinent test results  Airway Mallampati: III  TM Distance: >3 FB Neck ROM: Full    Dental no notable dental hx.    Pulmonary sleep apnea , former smoker,    Pulmonary exam normal breath sounds clear to auscultation       Cardiovascular hypertension, Pt. on medications Normal cardiovascular exam Rhythm:Regular Rate:Normal     Neuro/Psych negative neurological ROS  negative psych ROS   GI/Hepatic negative GI ROS, Neg liver ROS,   Endo/Other  diabetes, Type 2, Insulin Dependent, Oral Hypoglycemic AgentsMorbid obesity  Renal/GU negative Renal ROS  negative genitourinary   Musculoskeletal negative musculoskeletal ROS (+)   Abdominal   Peds negative pediatric ROS (+)  Hematology negative hematology ROS (+)   Anesthesia Other Findings   Reproductive/Obstetrics negative OB ROS                            Anesthesia Physical Anesthesia Plan  ASA: III  Anesthesia Plan: Spinal   Post-op Pain Management:    Induction:   Airway Management Planned: Simple Face Mask  Additional Equipment:   Intra-op Plan:   Post-operative Plan:   Informed Consent: I have reviewed the patients History and Physical, chart, labs and discussed the procedure including the risks, benefits and alternatives for the proposed anesthesia with the patient or authorized representative who has indicated his/her understanding and acceptance.   Dental advisory given  Plan Discussed with: CRNA  Anesthesia Plan Comments:         Anesthesia Quick Evaluation

## 2015-11-09 NOTE — Interval H&P Note (Signed)
History and Physical Interval Note:  11/09/2015 6:50 AM  Micheal Morris  has presented today for surgery, with the diagnosis of left knee osteoarthritis  The various methods of treatment have been discussed with the patient and family. After consideration of risks, benefits and other options for treatment, the patient has consented to  Procedure(s): LEFT TOTAL KNEE ARTHROPLASTY (Left) as a surgical intervention .  The patient's history has been reviewed, patient examined, no change in status, stable for surgery.  I have reviewed the patient's chart and labs.  Questions were answered to the patient's satisfaction.     Gearlean Alf

## 2015-11-09 NOTE — Anesthesia Postprocedure Evaluation (Signed)
Anesthesia Post Note  Patient: Micheal Morris  Procedure(s) Performed: Procedure(s) (LRB): LEFT TOTAL KNEE ARTHROPLASTY (Left)  Patient location during evaluation: PACU Anesthesia Type: Spinal Level of consciousness: oriented and awake and alert Pain management: pain level controlled Vital Signs Assessment: post-procedure vital signs reviewed and stable Respiratory status: spontaneous breathing, respiratory function stable and patient connected to nasal cannula oxygen Cardiovascular status: blood pressure returned to baseline and stable Postop Assessment: no headache and no backache Anesthetic complications: no    Last Vitals:  Filed Vitals:   11/09/15 1215 11/09/15 1322  BP: 124/72 102/54  Pulse: 79 49  Temp: 36.4 C 36.4 C  Resp: 18 16    Last Pain:  Filed Vitals:   11/09/15 1323  PainSc: 5                  Montez Hageman

## 2015-11-09 NOTE — Transfer of Care (Signed)
Immediate Anesthesia Transfer of Care Note  Patient: Micheal Morris  Procedure(s) Performed: Procedure(s): LEFT TOTAL KNEE ARTHROPLASTY (Left)  Patient Location: PACU  Anesthesia Type:Spinal  Level of Consciousness: sedated  Airway & Oxygen Therapy: Patient Spontanous Breathing and Patient connected to face mask oxygen  Post-op Assessment: Report given to RN and Post -op Vital signs reviewed and stable  Post vital signs: Reviewed and stable  Last Vitals:  Filed Vitals:   11/09/15 0515  BP: 140/74  Pulse: 86  Temp: 36.8 C  Resp: 18    Complications: No apparent anesthesia complications

## 2015-11-09 NOTE — H&P (View-Only) (Signed)
Micheal Morris DOB: Dec 15, 1945 Married / Language: Micheal Morris / Race: Black or African American Male Date of Admission:  11/09/2015 CC:  Left Knee Pain History of Present Illness  The patient is a 70 year old male who comes in for a preoperative History and Physical. The patient is scheduled for a left total knee arthroplasty to be performed by Dr. Dione Plover. Aluisio, MD at [facility] on 11-09-2015. The patient is a 70 year old male who presented for their knee. The patient is being followed for their left knee pain and osteoarthritis. They are now year(s) out from when symptoms began. Symptoms reported include: pain. The patient feels that they are doing poorly and report their pain level to be moderate to severe. The following medication has been used for pain control: none. The patient has reported improvement of their symptoms with: Cortisone injections (& aspiration 3 years ago). The knee has gotten progressively worse over time. Pain is occurring at all times now. It is limiting what he can and cannot do. The knee does not swell much on him. He occasionally feels like it wants to get out. Left knee shows bone on bone arthritis on the lateral and patellofemoral compartments with slight valgus deformity. At this point, the most predictable means of improving pain and function is total knee arthroplasty. The procedure, risks, potential complications and rehab course are discussed in detail and the patient elects to proceed. The goals of this procedure are decreased pain and increased function. There is a high liklihood that both of these goals will be achieved. Proceed with surgery as planned He has got advanced arthritic change, left knee, significant pain and dysfunction. He is already rescheduled for total knee. They have been treated conservatively in the past for the above stated problem and despite conservative measures, they continue to have progressive pain and severe functional  limitations and dysfunction. They have failed non-operative management including home exercise, medications, and injections. It is felt that they would benefit from undergoing total joint replacement. Risks and benefits of the procedure have been discussed with the patient and they elect to proceed with surgery. There are no active contraindications to surgery such as ongoing infection or rapidly progressive neurological disease.  Problem List/Past Medical Primary osteoarthritis of left knee (M17.12)  Diabetes Mellitus, Type II  Diverticulitis Of Colon  High blood pressure  Sleep Apnea  uses CPAP Impaired Vision  Glaucoma  Urinary Incontinence  Allergies Aspirin *ANALGESICS - NonNarcotic*  ulcers  Family History Diabetes Mellitus  mother, sister and brother Drug / Alcohol Addiction  father and brother Depression  child Cancer  brother Cerebrovascular Accident  mother and sister  Social History  Marital status  married Living situation  live with spouse Illicit drug use  no Tobacco use  former smoker; smoke(d) 1 pack(s) per day Pain Contract  no Number of flights of stairs before winded  2-3 Current work status  retired Children  2 Alcohol use  current drinker; drinks beer; only occasionally per week Exercise  Exercises monthly Drug/Alcohol Rehab (Previously)  yes Drug/Alcohol Rehab (Currently)  no Post-Surgical Plans  Home following the Left TKA to be preformed on 11/09/2015  Medication History Amlodipine Besy-Benazepril HCl (Oral) Specific strength unknown - Active. Azopt (Ophthalmic) Specific strength unknown - Active. BD Pen Needle Nano U/F Specific strength unknown - Active. Combigan (Ophthalmic) Specific strength unknown - Active. Gabapentin (Oral) Specific strength unknown - Active. HumaLOG KwikPen (Subcutaneous) Specific strength unknown - Active. Invokana (Oral) Specific strength  unknown - Active. Lantus SoloStar (Subcutaneous)  Specific strength unknown - Active. Lotemax (Ophthalmic) Specific strength unknown - Active. Lumigan (Ophthalmic) Specific strength unknown - Active. Metoprolol Succinate ER (Oral) Specific strength unknown - Active. OneTouch Ultra Blue (In Vitro) Specific strength unknown - Active. OneTouch UltraSoft Lancets Specific strength unknown - Active. Simvastatin (Oral) Specific strength unknown - Active. Spironolactone (Oral) Specific strength unknown - Active. Torsemide (Oral) Specific strength unknown - Active. Victoza (Subcutaneous) Specific strength unknown - Active. Myrbetriq (25MG  Tablet ER 24HR, Oral) Active.  Past Surgical History  No pertinent past surgical history     Review of Systems  General Not Present- Chills, Fatigue, Fever, Memory Loss, Night Sweats, Weight Gain and Weight Loss. Skin Not Present- Eczema, Hives, Itching, Lesions and Rash. HEENT Not Present- Dentures, Double Vision, Headache, Hearing Loss, Tinnitus and Visual Loss. Respiratory Not Present- Allergies, Chronic Cough, Coughing up blood, Shortness of breath at rest and Shortness of breath with exertion. Cardiovascular Not Present- Chest Pain, Difficulty Breathing Lying Down, Murmur, Palpitations, Racing/skipping heartbeats and Swelling. Gastrointestinal Not Present- Abdominal Pain, Bloody Stool, Constipation, Diarrhea, Difficulty Swallowing, Heartburn, Jaundice, Loss of appetitie, Nausea and Vomiting. Male Genitourinary Present- Incontinence. Not Present- Blood in Urine, Discharge, Flank Pain, Painful Urination, Urgency, Urinary frequency, Urinary Retention, Urinating at Night and Weak urinary stream. Musculoskeletal Present- Joint Swelling. Not Present- Back Pain, Joint Pain, Morning Stiffness, Muscle Pain, Muscle Weakness and Spasms. Neurological Not Present- Blackout spells, Difficulty with balance, Dizziness, Paralysis, Tremor and Weakness. Psychiatric Not Present- Insomnia.  Vitals  Pulse: 76  (Regular)  BP: 128/62 (Sitting, Right Arm, Standard)   Physical Exam General Mental Status -Alert, cooperative and good historian. General Appearance-pleasant, Not in acute distress. Orientation-Oriented X3. Build & Nutrition-Well nourished and Well developed.  Head and Neck Head-normocephalic, atraumatic . Neck Global Assessment - supple, no bruit auscultated on the right, no bruit auscultated on the left.  Eye Vision-Wears corrective lenses. Pupil - Bilateral-Regular and Round. Motion - Bilateral-EOMI.  Chest and Lung Exam Auscultation Breath sounds - clear at anterior chest wall and clear at posterior chest wall. Adventitious sounds - No Adventitious sounds.  Cardiovascular Auscultation Rhythm - Regular rate and rhythm. Heart Sounds - S1 WNL and S2 WNL. Murmurs & Other Heart Sounds - Auscultation of the heart reveals - No Murmurs.  Abdomen Palpation/Percussion Tenderness - Abdomen is non-tender to palpation. Rigidity (guarding) - Abdomen is soft. Auscultation Auscultation of the abdomen reveals - Bowel sounds normal.  Male Genitourinary Note: Not done, not pertinent to present illness   Musculoskeletal Note: Well developed male, alert and oriented, in no apparent distress. His left hip shows normal range of motion with no discomfort. His left knee shows no effusion. He does have a valgus deformity to left knee of about 10 degrees. He is tender laterally. There is some medial tenderness also. There is no instability noted about the left knee. Right knee exam range 0 to 125, slight crepitus on range of motion. No definitive tenderness or instability.  RADIOGRAPHS Reviewed the x-rays today. Left knee shows bone on bone arthritis on the lateral and patellofemoral compartments with slight valgus deformity.  Assessment & Plan Primary osteoarthritis of left knee (Principal Diagnosis) (M17.12)  Note:Surgical Plans: Left Total Knee  Replacement  Disposition: Home  PCP: Dr. Delrae Rend  IV TXA  Anesthesia Issues: None  Signed electronically by Ok Edwards, III PA-C

## 2015-11-10 LAB — GLUCOSE, CAPILLARY
GLUCOSE-CAPILLARY: 208 mg/dL — AB (ref 65–99)
Glucose-Capillary: 294 mg/dL — ABNORMAL HIGH (ref 65–99)
Glucose-Capillary: 147 mg/dL — ABNORMAL HIGH (ref 65–99)
Glucose-Capillary: 213 mg/dL — ABNORMAL HIGH (ref 65–99)

## 2015-11-10 LAB — CBC
HCT: 39 % (ref 39.0–52.0)
HEMOGLOBIN: 12.8 g/dL — AB (ref 13.0–17.0)
MCH: 25.7 pg — ABNORMAL LOW (ref 26.0–34.0)
MCHC: 32.8 g/dL (ref 30.0–36.0)
MCV: 78.2 fL (ref 78.0–100.0)
PLATELETS: 271 10*3/uL (ref 150–400)
RBC: 4.99 MIL/uL (ref 4.22–5.81)
RDW: 14.1 % (ref 11.5–15.5)
WBC: 17.1 10*3/uL — AB (ref 4.0–10.5)

## 2015-11-10 LAB — BASIC METABOLIC PANEL
ANION GAP: 10 (ref 5–15)
BUN: 33 mg/dL — ABNORMAL HIGH (ref 6–20)
CHLORIDE: 105 mmol/L (ref 101–111)
CO2: 24 mmol/L (ref 22–32)
Calcium: 8.8 mg/dL — ABNORMAL LOW (ref 8.9–10.3)
Creatinine, Ser: 1.56 mg/dL — ABNORMAL HIGH (ref 0.61–1.24)
GFR calc Af Amer: 51 mL/min — ABNORMAL LOW (ref >60)
GFR, EST NON AFRICAN AMERICAN: 44 mL/min — AB (ref >60)
Glucose, Bld: 219 mg/dL — ABNORMAL HIGH (ref 65–99)
POTASSIUM: 5 mmol/L (ref 3.5–5.1)
SODIUM: 139 mmol/L (ref 135–145)

## 2015-11-10 MED ORDER — TRAMADOL HCL 50 MG PO TABS: 50.0000 mg | ORAL_TABLET | Freq: Four times a day (QID) | ORAL | Status: DC | PRN

## 2015-11-10 MED ORDER — OXYCODONE HCL 5 MG PO TABS: 5.0000 mg | ORAL_TABLET | ORAL | Status: DC | PRN

## 2015-11-10 MED ORDER — METHOCARBAMOL 500 MG PO TABS: 500.0000 mg | ORAL_TABLET | Freq: Four times a day (QID) | ORAL | Status: DC | PRN

## 2015-11-10 MED ORDER — RIVAROXABAN 10 MG PO TABS: 10.0000 mg | ORAL_TABLET | Freq: Every day | ORAL | Status: DC

## 2015-11-10 MED ORDER — INSULIN ASPART 100 UNIT/ML ~~LOC~~ SOLN
25.0000 [IU] | Freq: Three times a day (TID) | SUBCUTANEOUS | Status: DC
  Administered 2015-11-10 – 2015-11-11 (×5): 25 [IU] via SUBCUTANEOUS

## 2015-11-10 NOTE — Progress Notes (Signed)
Physical Therapy Treatment Patient Details Name: Micheal Morris MRN: [id number] DOB: 05-Sep-1945 Today's Date: 11/10/2015    History of Present Illness s/p L TKA    PT Comments    POD # 1 am session Applied KI and instructed on use and proper application.  Assisted OOB to amb in hallway with increased time and Min c/o feeling "goofy".  Returned to room then performed some TKR TE's followed by ICE.    Follow Up Recommendations  Home health PT     Equipment Recommendations  Rolling walker with 5" wheels    Recommendations for Other Services       Precautions / Restrictions Precautions Precautions: Fall;Knee Precaution Comments: Instructed on KI use for amb and applied Required Braces or Orthoses: Knee Immobilizer - Left Knee Immobilizer - Left: Discontinue once straight leg raise with < 10 degree lag Restrictions Weight Bearing Restrictions: No    Mobility  Bed Mobility Overal bed mobility: Needs Assistance Bed Mobility: Supine to Sit     Supine to sit: Mod assist     General bed mobility comments: Mod Assist to support L LE and Mod Assist for upper body.    Transfers Overall transfer level: Needs assistance Equipment used: Rolling walker (2 wheeled) Transfers: Sit to/from Stand Sit to Stand: Min assist         General transfer comment: elvated bed and VC's on proper hand placement with stand to sit.    Ambulation/Gait Ambulation/Gait assistance: Min assist;Min guard Ambulation Distance (Feet): 25 Feet Assistive device: Rolling walker (2 wheeled) Gait Pattern/deviations: Step-to pattern Gait velocity: decreased   General Gait Details: cues for  sequence and upright posture.  Tolerated increased distance.     Stairs            Wheelchair Mobility    Modified Rankin (Stroke Patients Only)       Balance                                    Cognition Arousal/Alertness: Awake/alert Behavior During Therapy: WFL for tasks  assessed/performed Overall Cognitive Status: Within Functional Limits for tasks assessed                      Exercises   Total Knee Replacement TE's 10 reps B LE ankle pumps 10 reps towel squeezes 10 reps knee presses 10 reps heel slides AAROM 10 reps SLR's AAROM 10 reps ABD AAROM Followed by ICE     General Comments        Pertinent Vitals/Pain Pain Assessment: 0-10 Pain Score: 4  Pain Location: L knee Pain Descriptors / Indicators: Sore Pain Intervention(s): Monitored during session;Premedicated before session;Repositioned    Home Living Family/patient expects to be discharged to:: Private residence Living Arrangements: Spouse/significant other Available Help at Discharge: Family           Additional Comments: pt planning to stay downstairs, requests hospital bed    Prior Function Level of Independence: Independent          PT Goals (current goals can now be found in the care plan section) Acute Rehab PT Goals Patient Stated Goal: return to I    Frequency  7X/week    PT Plan Current plan remains appropriate    Co-evaluation             End of Session Equipment Utilized During Treatment: Gait belt;Left knee immobilizer Activity Tolerance:  Patient tolerated treatment well Patient left: with call bell/phone within reach;in chair;with chair alarm set;with family/visitor present     Time: 0920-1005 PT Time Calculation (min) (ACUTE ONLY): 45 min  Charges:  $Gait Training: 8-22 mins $Therapeutic Exercise: 8-22 mins $Therapeutic Activity: 8-22 mins                    G Codes:      Rica Koyanagi  PTA [facility]  Acute  Rehab Pager      [phone]

## 2015-11-10 NOTE — Care Management Note (Signed)
Case Management Note  Patient Details  Name: Micheal Morris MRN: [id number] Date of Birth: 1945-08-27  Subjective/Objective:                  LEFT TOTAL KNEE ARTHROPLASTY (Left) Action/Plan: Discharge planning Expected Discharge Date:  11/11/15               Expected Discharge Plan:  [place]  In-House Referral:     Discharge planning Services  CM Consult  Post Acute Care Choice:    Choice offered to:  Patient, Spouse  DME Arranged:  3-N-1, Walker rolling DME Agency:  [place]:  PT [place] Agency:  [place]  Status of Service:  Completed, signed off  Medicare Important Message Given:  Yes Date Medicare IM Given:    Medicare IM give by:    Date Additional Medicare IM Given:    Additional Medicare Important Message give by:     If discussed at [place] of Stay Meetings, dates discussed:    Additional Comments: CM met with pt and spouse in room to offer choice of home health agency.  Pt chooses [facility] to render HHPT.  Referral called to [employer], Tim.  Cm called [facility] DME rep, Lecretia to please deliver the hospital bed, rolling walker and 3n1 to home: [phone].  No other CM needs were communicated. Dellie Catholic, RN 11/10/2015, 10:40 AM

## 2015-11-10 NOTE — Progress Notes (Signed)
[facility] is providing the following services: RW, commode and hospital bed all for delivery to the patient's home today.  If patient discharges after hours, please call [phone].   Linward Headland 11/10/2015, 1:02 PM

## 2015-11-10 NOTE — Evaluation (Signed)
Occupational Therapy Evaluation Patient Details Name: Micheal Morris MRN: [id number] DOB: 29-Aug-1945 Today's Date: 11/10/2015    History of Present Illness s/p L TKA   Clinical Impression   This 70 year old man was admitted for the above surgery.  Wife will assist pt with adls at home. Will follow to further educate on bathroom transfers, DME and activity tolerance for standing tasks. Goals are for min guard to min A in acute    Follow Up Recommendations  Supervision/Assistance - 24 hour    Equipment Recommendations  3 in 1 bedside comode (pt to think about tub DME)    Recommendations for Other Services       Precautions / Restrictions Precautions Precautions: Fall;Knee Precaution Comments: Instructed on KI use for amb and applied Required Braces or Orthoses: Knee Immobilizer - Left Knee Immobilizer - Left: Discontinue once straight leg raise with < 10 degree lag Restrictions Weight Bearing Restrictions: No      Mobility Bed Mobility               General bed mobility comments: oob  Transfers   Equipment used: Rolling walker (2 wheeled) Transfers: Sit to/from Stand Sit to Stand: Min assist         General transfer comment: steadying assistance for transition; cues for UE/LE placement and assist to slide LLE forward when sitting    Balance                                            ADL Overall ADL's : Needs assistance/impaired             Lower Body Bathing: Moderate assistance;Sit to/from stand       Lower Body Dressing: Maximal assistance;Sit to/from stand   Toilet Transfer: Minimal assistance;Ambulation;BSC;RW             General ADL Comments: educated wife on KI and both she and pt on tub bench vs tub seats.  Feel pt would benefit from tub bench vs sponge bathing.   Educated on placement and cutting inexpensive shower curtain liner to fit between panels to contain water in tub.  When ambulating, he does not place  full weight on LLE.  When pt sat on 3:1, he reported he felt a little clammy.  Brought recliner to door and pt transferred back to this.  BP 155/70.  cold cloth given to him and room temperature decreased.  Pt felt better.  Wife will assist with adls as needed.  He can perform UB with set up.     Vision     Perception     Praxis      Pertinent Vitals/Pain Pain Assessment: 0-10 Pain Score: 4  Pain Location: L knee Pain Descriptors / Indicators: Sore Pain Intervention(s): Monitored during session;Premedicated before session;Repositioned     Hand Dominance     Extremity/Trunk Assessment Upper Extremity Assessment Upper Extremity Assessment: Overall WFL for tasks assessed           Communication Communication Communication: No difficulties   Cognition Arousal/Alertness: Awake/alert Behavior During Therapy: WFL for tasks assessed/performed Overall Cognitive Status: Within Functional Limits for tasks assessed                     General Comments       Exercises       Shoulder Instructions  Home Living Family/patient expects to be discharged to:: Private residence Living Arrangements: Spouse/significant other Available Help at Discharge: Family               Bathroom Shower/Tub: Tub/shower unit Shower/tub characteristics: Architectural technologist: Standard         Additional Comments: pt planning to stay downstairs, requests hospital bed      Prior Functioning/Environment Level of Independence: Independent             OT Diagnosis: Generalized weakness   OT Problem List: Decreased strength;Decreased activity tolerance;Decreased knowledge of use of DME or AE;Pain   OT Treatment/Interventions: Self-care/ADL training;DME and/or AE instruction;Patient/family education    OT Goals(Current goals can be found in the care plan section) Acute Rehab OT Goals Patient Stated Goal: return to I OT Goal Formulation: With patient Time For Goal  Achievement: 11/17/15 Potential to Achieve Goals: Good ADL Goals Pt Will Perform Grooming: with min guard assist;standing Pt Will Transfer to Toilet: with min guard assist;ambulating;bedside commode Pt Will Perform Tub/Shower Transfer: Tub transfer;with min assist;ambulating;tub bench  OT Frequency: Min 2X/week   Barriers to D/C:            Co-evaluation              End of Session CPM Left Knee CPM Left Knee: Off Nurse Communication:  (pt feeling clammy but now better and BP OK)  Activity Tolerance: Other (comment) Patient left: in chair;with call bell/phone within reach;with chair alarm set;with family/visitor present   Time: 1105-1136 OT Time Calculation (min): 31 min Charges:  OT General Charges $OT Visit: 1 Procedure OT Evaluation $OT Eval Low Complexity: 1 Procedure OT Treatments $Self Care/Home Management : 8-22 mins G-Codes:    Kortny Lirette 2015/12/05, 12:23 PM [name], OTR/L [phone] 12-05-15

## 2015-11-10 NOTE — Progress Notes (Signed)
   Subjective: 1 Day Post-Op Procedure(s) (LRB): LEFT TOTAL KNEE ARTHROPLASTY (Left) Patient reports pain as mild.   Patient seen in rounds with Dr. Wynelle Link. Wife in room. Patient is well, but has had some minor complaints of pain in the knee, requiring pain medications We will start therapy today.  Plan is to go Home after hospital stay.  Objective: Vital signs in last 24 hours: Temp:  [97.5 F (36.4 C)-98.4 F (36.9 C)] 98.4 F (36.9 C) (04/11 0558) Pulse Rate:  [49-90] 75 (04/11 0558) Resp:  [12-20] 16 (04/11 0558) BP: (102-146)/(53-74) 146/71 mmHg (04/11 0558) SpO2:  [98 %-100 %] 100 % (04/11 0558)  Intake/Output from previous day:  Intake/Output Summary (Last 24 hours) at 11/10/15 0917 Last data filed at 11/10/15 0559  Gross per 24 hour  Intake   1765 ml  Output   5825 ml  Net  -4060 ml    Intake/Output this shift: UOP 1875  Labs:  Recent Labs  11/10/15 0355  HGB 12.8*    Recent Labs  11/10/15 0355  WBC 17.1*  RBC 4.99  HCT 39.0  PLT 271    Recent Labs  11/10/15 0355  NA 139  K 5.0  CL 105  CO2 24  BUN 33*  CREATININE 1.56*  GLUCOSE 219*  CALCIUM 8.8*   No results for input(s): LABPT, INR in the last 72 hours.  EXAM General - Patient is Alert, Appropriate and Oriented Extremity - Neurovascular intact Sensation intact distally Dorsiflexion/Plantar flexion intact Dressing - dressing C/D/I Motor Function - intact, moving foot and toes well on exam.  Hemovac pulled without difficulty.  Past Medical History  Diagnosis Date  . Erectile dysfunction   . HTN (hypertension)   . Hyperlipidemia   . Type II diabetes mellitus ([place])   . Chronic kidney disease   . Sensory hearing loss, bilateral   . Restrictive lung disease   . Obesity   . Pain of left upper arm   . Arthritis   . OSA (obstructive sleep apnea)     pt doesnt know settings  . SOBOE (shortness of breath on exertion) 11/03/15    currently goes to gym  . Neuromuscular disorder  ([facility])     neuropathy    Assessment/Plan: 1 Day Post-Op Procedure(s) (LRB): LEFT TOTAL KNEE ARTHROPLASTY (Left) Principal Problem:   OA (osteoarthritis) of knee  Estimated body mass index is 39.17 kg/(m^2) as calculated from the following:   Height as of this encounter: 5\' 10"  (1.778 m).   Weight as of this encounter: 123.832 kg (273 lb). Advance diet Up with therapy Plan for discharge tomorrow Discharge home with home health  DVT Prophylaxis - Xarelto Weight-Bearing as tolerated to left leg D/C O2 and Pulse OX and try on Combine Hospital Bed for home  [name], PA-C Orthopaedic Surgery 11/10/2015, 9:17 AM

## 2015-11-10 NOTE — Progress Notes (Signed)
Patient stated that since he is no longer wearing oxygen that he can apply the CPAP with his home nasal pillows without a problem. RT advised that if he had any issues to give respiratory a call.  RT will continue to monitor

## 2015-11-10 NOTE — Discharge Summary (Signed)
Physician Discharge Summary   Patient ID: Micheal Morris MRN: [id number] DOB/AGE: 10/19/1945 70 y.o.  Admit date: 11/09/2015 Discharge date: 11/11/2015  Primary Diagnosis:  Osteoarthritis Left knee(s) Admission Diagnoses:  Past Medical History  Diagnosis Date  . Erectile dysfunction   . HTN (hypertension)   . Hyperlipidemia   . Type II diabetes mellitus ([place])   . Chronic kidney disease   . Sensory hearing loss, bilateral   . Restrictive lung disease   . Obesity   . Pain of left upper arm   . Arthritis   . OSA (obstructive sleep apnea)     pt doesnt know settings  . SOBOE (shortness of breath on exertion) 11/03/15    currently goes to gym  . Neuromuscular disorder ([place])     neuropathy   Discharge Diagnoses:   Principal Problem:   OA (osteoarthritis) of knee  Estimated body mass index is 39.17 kg/(m^2) as calculated from the following:   Height as of this encounter: _0  (1.778 m).   Weight as of this encounter: 123.832 kg (273 lb).  Procedure:  Procedure(s) (LRB): LEFT TOTAL KNEE ARTHROPLASTY (Left)   Consults: None  HPI: Micheal Morris is a 70 y.o. year old male with end stage OA of his left knee with progressively worsening pain and dysfunction. He has constant pain, with activity and at rest and significant functional deficits with difficulties even with ADLs. He has had extensive non-op management including analgesics, injections of cortisone and viscosupplements, and home exercise program, but remains in significant pain with significant dysfunction. Radiographs show bone on bone arthritis lateral and patelofemoral. He presents now for left Total Knee Arthroplasty.  Laboratory Data: Admission on 11/09/2015  Component Date Value Ref Range Status  . Glucose-Capillary 11/09/2015 113* 65 - 99 mg/dL Final  . Glucose-Capillary 11/09/2015 127* 65 - 99 mg/dL Final  . Glucose-Capillary 11/09/2015 233* 65 - 99 mg/dL Final  . WBC 11/10/2015 17.1* 4.0 - 10.5 K/uL Final   . RBC 11/10/2015 4.99  4.22 - 5.81 MIL/uL Final  . Hemoglobin 11/10/2015 12.8* 13.0 - 17.0 g/dL Final  . HCT 11/10/2015 39.0  39.0 - 52.0 % Final  . MCV 11/10/2015 78.2  78.0 - 100.0 fL Final  . MCH 11/10/2015 25.7* 26.0 - 34.0 pg Final  . MCHC 11/10/2015 32.8  30.0 - 36.0 g/dL Final  . RDW 11/10/2015 14.1  11.5 - 15.5 % Final  . Platelets 11/10/2015 271  150 - 400 K/uL Final  . Sodium 11/10/2015 139  135 - 145 mmol/L Final  . Potassium 11/10/2015 5.0  3.5 - 5.1 mmol/L Final  . Chloride 11/10/2015 105  101 - 111 mmol/L Final  . CO2 11/10/2015 24  22 - 32 mmol/L Final  . Glucose, Bld 11/10/2015 219* 65 - 99 mg/dL Final  . BUN 11/10/2015 33* 6 - 20 mg/dL Final  . Creatinine, Ser 11/10/2015 1.56* 0.61 - 1.24 mg/dL Final  . Calcium 11/10/2015 8.8* 8.9 - 10.3 mg/dL Final  . GFR calc non Af Amer 11/10/2015 44* >60 mL/min Final  . GFR calc Af Amer 11/10/2015 51* >60 mL/min Final   Comment: (NOTE) The eGFR has been calculated using the CKD EPI equation. This calculation has not been validated in all clinical situations. eGFR's persistently <60 mL/min signify possible Chronic Kidney Disease.   . Anion gap 11/10/2015 10  5 - 15 Final  . Glucose-Capillary 11/09/2015 329* 65 - 99 mg/dL Final  . Comment 1 11/09/2015 Notify RN   Final  .  Comment 2 11/09/2015 Document in Chart   Final  . Glucose-Capillary 11/09/2015 397* 65 - 99 mg/dL Final  . Comment 1 11/09/2015 Notify RN   Final  . Comment 2 11/09/2015 Document in Chart   Final  . Glucose-Capillary 11/10/2015 208* 65 - 99 mg/dL Final  . Glucose-Capillary 11/10/2015 294* 65 - 99 mg/dL Final  . Glucose-Capillary 11/10/2015 147* 65 - 99 mg/dL Final  . Glucose-Capillary 11/10/2015 213* 65 - 99 mg/dL Final  . Comment 1 11/10/2015 Notify RN   Final  . Comment 2 11/10/2015 Document in Chart   Final  Hospital Outpatient Visit on 11/03/2015  Component Date Value Ref Range Status  . ABO/RH(D) 11/03/2015 O POS   Final  . Antibody Screen 11/03/2015  NEG   Final  . Sample Expiration 11/03/2015 11/12/2015   Final  . Extend sample reason 11/03/2015 NO TRANSFUSIONS OR PREGNANCY IN THE PAST 3 MONTHS   Final  . MRSA, PCR 11/03/2015 NEGATIVE  NEGATIVE Final  . Staphylococcus aureus 11/03/2015 NEGATIVE  NEGATIVE Final   Comment:        The Xpert SA Assay (FDA approved for NASAL specimens in patients over 30 years of age), is one component of a comprehensive surveillance program.  Test performance has been validated by [facility] for patients greater than or equal to 30 year old. It is not intended to diagnose infection nor to guide or monitor treatment.      X-Rays:No results found.  EKG: Orders placed or performed during the hospital encounter of 11/03/15  . EKG 12-Lead  . EKG 12-Lead     Hospital Course: Micheal Morris is a 70 y.o. who was admitted to [facility]. They were brought to the operating room on 11/09/2015 and underwent Procedure(s): LEFT TOTAL KNEE ARTHROPLASTY.  Patient tolerated the procedure well and was later transferred to the recovery room and then to the orthopaedic floor for postoperative care.  They were given PO and IV analgesics for pain control following their surgery.  They were given 24 hours of postoperative antibiotics of  Anti-infectives    Start     Dose/Rate Route Frequency Ordered Stop   11/09/15 1400  ceFAZolin (ANCEF) IVPB 2g/100 mL premix     2 g 200 mL/hr over 30 Minutes Intravenous Every 6 hours 11/09/15 1019 11/09/15 2110   11/09/15 0600  ceFAZolin (ANCEF) 3 g in dextrose 5 % 50 mL IVPB     3 g 130 mL/hr over 30 Minutes Intravenous On call to O.R. 11/08/15 1334 11/09/15 0742     and started on DVT prophylaxis in the form of Xarelto.   PT and OT were ordered for total joint protocol.  Discharge planning consulted to help with postop disposition and equipment needs.  Patient had a good night on the evening of surgery.  They started to get up OOB with therapy on day one. Hemovac  drain was pulled without difficulty.  Continued to work with therapy into day two.  Dressing was changed on day two and the incision was healing well.  Patient was seen in rounds on POD 2 and was ready to go home.  Discharge home with home health Diet - Cardiac diet, Diabetic diet and Renal diet Follow up - in 2 weeks Activity - WBAT Disposition - Home Condition Upon Discharge - Good D/C Meds - See DC Summary DVT Prophylaxis - Xarelto  Discharge Instructions    Call MD / Call 911    Complete by:  As  directed   If you experience chest pain or shortness of breath, CALL 911 and be transported to the hospital emergency room.  If you develope a fever above 101 F, pus (white drainage) or increased drainage or redness at the wound, or calf pain, call your surgeon's office.     Change dressing    Complete by:  As directed   Change dressing daily with sterile 4 x 4 inch gauze dressing and apply TED hose. Do not submerge the incision under water.     Constipation Prevention    Complete by:  As directed   Drink plenty of fluids.  Prune juice may be helpful.  You may use a stool softener, such as Colace (over the counter) 100 mg twice a day.  Use MiraLax (over the counter) for constipation as needed.     Diet - low sodium heart healthy    Complete by:  As directed      Diet Carb Modified    Complete by:  As directed      Discharge instructions    Complete by:  As directed   Pick up stool softner and laxative for home use following surgery while on pain medications. Do not submerge incision under water. Please use good hand washing techniques while changing dressing each day. May shower starting three days after surgery. Please use a clean towel to pat the incision dry following showers. Continue to use ice for pain and swelling after surgery. Do not use any lotions or creams on the incision until instructed by your surgeon.  Take Xarelto for two and a half more weeks, then discontinue  Xarelto. Once the patient has completed the blood thinner regimen, then take a Baby 81 mg Aspirin daily for three more weeks.  Postoperative Constipation Protocol  Constipation - defined medically as fewer than three stools per week and severe constipation as less than one stool per week.  One of the most common issues patients have following surgery is constipation.  Even if you have a regular bowel pattern at home, your normal regimen is likely to be disrupted due to multiple reasons following surgery.  Combination of anesthesia, postoperative narcotics, change in appetite and fluid intake all can affect your bowels.  In order to avoid complications following surgery, here are some recommendations in order to help you during your recovery period.  Colace (docusate) - Pick up an over-the-counter form of Colace or another stool softener and take twice a day as long as you are requiring postoperative pain medications.  Take with a full glass of water daily.  If you experience loose stools or diarrhea, hold the colace until you stool forms back up.  If your symptoms do not get better within 1 week or if they get worse, check with your doctor.  Dulcolax (bisacodyl) - Pick up over-the-counter and take as directed by the product packaging as needed to assist with the movement of your bowels.  Take with a full glass of water.  Use this product as needed if not relieved by Colace only.   MiraLax (polyethylene glycol) - Pick up over-the-counter to have on hand.  MiraLax is a solution that will increase the amount of water in your bowels to assist with bowel movements.  Take as directed and can mix with a glass of water, juice, soda, coffee, or tea.  Take if you go more than two days without a movement. Do not use MiraLax more than once per day. Call your doctor if  you are still constipated or irregular after using this medication for 7 days in a row.  If you continue to have problems with postoperative  constipation, please contact the office for further assistance and recommendations.  If you experience "the worst abdominal pain ever" or develop nausea or vomiting, please contact the office immediatly for further recommendations for treatment.     Do not put a pillow under the knee. Place it under the heel.    Complete by:  As directed      Do not sit on low chairs, stoools or toilet seats, as it may be difficult to get up from low surfaces    Complete by:  As directed      Driving restrictions    Complete by:  As directed   No driving until released by the physician.     Increase activity slowly as tolerated    Complete by:  As directed      Lifting restrictions    Complete by:  As directed   No lifting until released by the physician.     Patient may shower    Complete by:  As directed   You may shower without a dressing once there is no drainage.  Do not wash over the wound.  If drainage remains, do not shower until drainage stops.     TED hose    Complete by:  As directed   Use stockings (TED hose) for 3 weeks on both leg(s).  You may remove them at night for sleeping.     Weight bearing as tolerated    Complete by:  As directed   Laterality:  left  Extremity:  Lower            Medication List    STOP taking these medications        fish oil-omega-3 fatty acids 1000 MG capsule     FLAX SEED OIL PO     Glucosamine-Chondroitin 750-600 MG Tabs     multivitamin with minerals tablet     PROBIOTIC & ACIDOPHILUS EX ST PO     vardenafil 20 MG tablet  Commonly known as:  LEVITRA      TAKE these medications        amLODipine-benazepril 10-20 MG capsule  Commonly known as:  LOTREL  TAKE 1 CAPSULE BY MOUTH DAILY     AZOPT 1 % ophthalmic suspension  Generic drug:  brinzolamide  Place 1 drop into the right eye 2 (two) times daily.     BD PEN NEEDLE NANO U/F 32G X 4 MM Misc  Generic drug:  Insulin Pen Needle  Use as directed with insulin     COMBIGAN 0.2-0.5 %  ophthalmic solution  Generic drug:  brimonidine-timolol  Place 1 drop into both eyes 2 (two) times daily.     fluticasone 50 MCG/ACT nasal spray  Commonly known as:  FLONASE  Place 2 sprays into the nose daily as needed for allergies.     gabapentin 300 MG capsule  Commonly known as:  NEURONTIN  Take 300 mg by mouth 2 (two) times daily.     HUMALOG 100 UNIT/ML injection  Generic drug:  insulin lispro  Inject 44 Units into the skin 3 (three) times daily with meals.     INVOKANA 100 MG Tabs tablet  Generic drug:  canagliflozin  Take 1 tablet by mouth daily.     LANTUS 100 UNIT/ML injection  Generic drug:  insulin glargine  Inject 75 Units into  the skin daily. 70 UNITS PER DAY     LUMIGAN 0.01 % Soln  Generic drug:  bimatoprost  Place 1 drop into both eyes at bedtime.     methocarbamol 500 MG tablet  Commonly known as:  ROBAXIN  Take 1 tablet (500 mg total) by mouth every 6 (six) hours as needed for muscle spasms.     metoprolol succinate 100 MG 24 hr tablet  Commonly known as:  TOPROL-XL  TAKE 1 TABLET BY MOUTH ONCE DAILY     MYRBETRIQ 25 MG Tb24 tablet  Generic drug:  mirabegron ER  Take 25 mg by mouth daily.     onetouch ultrasoft lancets  Use 1 lancets three (3) times a day with insulin     oxyCODONE 5 MG immediate release tablet  Commonly known as:  Oxy IR/ROXICODONE  Take 1-2 tablets (5-10 mg total) by mouth every 3 (three) hours as needed for moderate pain or severe pain.     rivaroxaban 10 MG Tabs tablet  Commonly known as:  XARELTO  Take 1 tablet (10 mg total) by mouth daily with breakfast. Take Xarelto for two and a half more weeks, then discontinue Xarelto. Once the patient has completed the blood thinner regimen, then take a Baby 81 mg Aspirin daily for three more weeks.     simvastatin 20 MG tablet  Commonly known as:  ZOCOR  TAKE 1 TABLET BY MOUTH EVERY DAY AT BEDTIME     spironolactone 25 MG tablet  Commonly known as:  ALDACTONE  TAKE 1 TABLET BY  MOUTH EVERY DAY     torsemide 20 MG tablet  Commonly known as:  DEMADEX  TAKE 1 TABLET EVERY DAY     traMADol 50 MG tablet  Commonly known as:  ULTRAM  Take 1-2 tablets (50-100 mg total) by mouth every 6 (six) hours as needed (mild pain).     VICTOZA 18 MG/3ML Sopn  Generic drug:  Liraglutide  Inject 1.8 mg as directed daily.           Follow-up Information    Follow up with [place].   Why:  hospital bed, 3n1 (over the commode seat), and rolling walker   Contact information:   [address] [place] [address] [phone]       Follow up with Gearlean Alf, MD On 11/24/2015.   Specialty:  Orthopedic Surgery   Why:  Call office at [phone] to setup appointment on tuesday 11/24/15 with Dr. Wynelle Link.   Contact information:   [address] [place] [address] [phone]       Signed: [name], PA-C Orthopaedic Surgery 11/10/2015, 10:28 PM

## 2015-11-10 NOTE — Discharge Instructions (Addendum)
° °Dr. Frank Aluisio °Total Joint Specialist °[facility] °[address] °[place], [place] [address] °[phone] ° °TOTAL KNEE REPLACEMENT POSTOPERATIVE DIRECTIONS ° °Knee Rehabilitation, Guidelines Following Surgery  °Results after knee surgery are often greatly improved when you follow the exercise, range of motion and muscle strengthening exercises prescribed by your doctor. Safety measures are also important to protect the knee from further injury. Any time any of these exercises cause you to have increased pain or swelling in your knee joint, decrease the amount until you are comfortable again and slowly increase them. If you have problems or questions, call your caregiver or physical therapist for advice.  ° °HOME CARE INSTRUCTIONS  °Remove items at home which could result in a fall. This includes throw rugs or furniture in walking pathways.  °· ICE to the affected knee every three hours for 30 minutes at a time and then as needed for pain and swelling.  Continue to use ice on the knee for pain and swelling from surgery. You may notice swelling that will progress down to the foot and ankle.  This is normal after surgery.  Elevate the leg when you are not up walking on it.   °· Continue to use the breathing machine which will help keep your temperature down.  It is common for your temperature to cycle up and down following surgery, especially at night when you are not up moving around and exerting yourself.  The breathing machine keeps your lungs expanded and your temperature down. °· Do not place pillow under knee, focus on keeping the knee straight while resting ° °DIET °You may resume your previous home diet once your are discharged from the hospital. ° °DRESSING / WOUND CARE / SHOWERING °You may shower 3 days after surgery, but keep the wounds dry during showering.  You may use an occlusive plastic wrap (Press'n Seal for example), NO SOAKING/SUBMERGING IN THE BATHTUB.  If the  bandage gets wet, change with a clean dry gauze.  If the incision gets wet, pat the wound dry with a clean towel. °You may start showering once you are discharged home but do not submerge the incision under water. Just pat the incision dry and apply a dry gauze dressing on daily. °Change the surgical dressing daily and reapply a dry dressing each time. ° °ACTIVITY °Walk with your walker as instructed. °Use walker as long as suggested by your caregivers. °Avoid periods of inactivity such as sitting longer than an hour when not asleep. This helps prevent blood clots.  °You may resume a sexual relationship in one month or when given the OK by your doctor.  °You may return to work once you are cleared by your doctor.  °Do not drive a car for 6 weeks or until released by you surgeon.  °Do not drive while taking narcotics. ° °WEIGHT BEARING °Weight bearing as tolerated with assist device (walker, cane, etc) as directed, use it as long as suggested by your surgeon or therapist, typically at least 4-6 weeks. ° °POSTOPERATIVE CONSTIPATION PROTOCOL °Constipation - defined medically as fewer than three stools per week and severe constipation as less than one stool per week. ° °One of the most common issues patients have following surgery is constipation.  Even if you have a regular bowel pattern at home, your normal regimen is likely to be disrupted due to multiple reasons following surgery.  Combination of anesthesia, postoperative narcotics, change in appetite and fluid intake all can affect your bowels.    In order to avoid complications following surgery, here are some recommendations in order to help you during your recovery period. ° °Colace (docusate) - Pick up an over-the-counter form of Colace or another stool softener and take twice a day as long as you are requiring postoperative pain medications.  Take with a full glass of water daily.  If you experience loose stools or diarrhea, hold the colace until you stool forms  back up.  If your symptoms do not get better within 1 week or if they get worse, check with your doctor. ° °Dulcolax (bisacodyl) - Pick up over-the-counter and take as directed by the product packaging as needed to assist with the movement of your bowels.  Take with a full glass of water.  Use this product as needed if not relieved by Colace only.  ° °MiraLax (polyethylene glycol) - Pick up over-the-counter to have on hand.  MiraLax is a solution that will increase the amount of water in your bowels to assist with bowel movements.  Take as directed and can mix with a glass of water, juice, soda, coffee, or tea.  Take if you go more than two days without a movement. °Do not use MiraLax more than once per day. Call your doctor if you are still constipated or irregular after using this medication for 7 days in a row. ° °If you continue to have problems with postoperative constipation, please contact the office for further assistance and recommendations.  If you experience "the worst abdominal pain ever" or develop nausea or vomiting, please contact the office immediatly for further recommendations for treatment. ° °ITCHING ° If you experience itching with your medications, try taking only a single pain pill, or even half a pain pill at a time.  You can also use Benadryl over the counter for itching or also to help with sleep.  ° °TED HOSE STOCKINGS °Wear the elastic stockings on both legs for three weeks following surgery during the day but you may remove then at night for sleeping. ° °MEDICATIONS °See your medication summary on the “After Visit Summary” that the nursing staff will review with you prior to discharge.  You may have some home medications which will be placed on hold until you complete the course of blood thinner medication.  It is important for you to complete the blood thinner medication as prescribed by your surgeon.  Continue your approved medications as instructed at time of  discharge. ° °PRECAUTIONS °If you experience chest pain or shortness of breath - call 911 immediately for transfer to the hospital emergency department.  °If you develop a fever greater that 101 F, purulent drainage from wound, increased redness or drainage from wound, foul odor from the wound/dressing, or calf pain - CONTACT YOUR SURGEON.   °                                                °FOLLOW-UP APPOINTMENTS °Make sure you keep all of your appointments after your operation with your surgeon and caregivers. You should call the office at the above phone number and make an appointment for approximately two weeks after the date of your surgery or on the date instructed by your surgeon outlined in the "After Visit Summary". ° ° °RANGE OF MOTION AND STRENGTHENING EXERCISES  °Rehabilitation of the knee is important following a knee injury or   an operation. After just a few days of immobilization, the muscles of the thigh which control the knee become weakened and shrink (atrophy). Knee exercises are designed to build up the tone and strength of the thigh muscles and to improve knee motion. Often times heat used for twenty to thirty minutes before working out will loosen up your tissues and help with improving the range of motion but do not use heat for the first two weeks following surgery. These exercises can be done on a training (exercise) mat, on the floor, on a table or on a bed. Use what ever works the best and is most comfortable for you Knee exercises include:  °Leg Lifts - While your knee is still immobilized in a splint or cast, you can do straight leg raises. Lift the leg to 60 degrees, hold for 3 sec, and slowly lower the leg. Repeat 10-20 times 2-3 times daily. Perform this exercise against resistance later as your knee gets better.  °Quad and Hamstring Sets - Tighten up the muscle on the front of the thigh (Quad) and hold for 5-10 sec. Repeat this 10-20 times hourly. Hamstring sets are done by pushing the  foot backward against an object and holding for 5-10 sec. Repeat as with quad sets.  °· Leg Slides: Lying on your back, slowly slide your foot toward your buttocks, bending your knee up off the floor (only go as far as is comfortable). Then slowly slide your foot back down until your leg is flat on the floor again. °· Angel Wings: Lying on your back spread your legs to the side as far apart as you can without causing discomfort.  °A rehabilitation program following serious knee injuries can speed recovery and prevent re-injury in the future due to weakened muscles. Contact your doctor or a physical therapist for more information on knee rehabilitation.  ° °IF YOU ARE TRANSFERRED TO A SKILLED REHAB FACILITY °If the patient is transferred to a skilled rehab facility following release from the hospital, a list of the current medications will be sent to the facility for the patient to continue.  When discharged from the skilled rehab facility, please have the facility set up the patient's Home Health Physical Therapy prior to being released. Also, the skilled facility will be responsible for providing the patient with their medications at time of release from the facility to include their pain medication, the muscle relaxants, and their blood thinner medication. If the patient is still at the rehab facility at time of the two week follow up appointment, the skilled rehab facility will also need to assist the patient in arranging follow up appointment in our office and any transportation needs. ° °MAKE SURE YOU:  °Understand these instructions.  °Get help right away if you are not doing well or get worse.  ° ° °Pick up stool softner and laxative for home use following surgery while on pain medications. °Do not submerge incision under water. °Please use good hand washing techniques while changing dressing each day. °May shower starting three days after surgery. °Please use a clean towel to pat the incision dry following  showers. °Continue to use ice for pain and swelling after surgery. °Do not use any lotions or creams on the incision until instructed by your surgeon. ° °Take Xarelto for two and a half more weeks, then discontinue Xarelto. °Once the patient has completed the blood thinner regimen, then take a Baby 81 mg Aspirin daily for three more weeks. ° ° ° °  Information on my medicine - XARELTO® (Rivaroxaban) ° °This medication education was reviewed with me or my healthcare representative as part of my discharge preparation.  The pharmacist that spoke with me during my hospital stay was:  Green, Terri L, RPH ° °Why was Xarelto® prescribed for you? °Xarelto® was prescribed for you to reduce the risk of blood clots forming after orthopedic surgery. The medical term for these abnormal blood clots is venous thromboembolism (VTE). ° °What do you need to know about xarelto® ? °Take your Xarelto® ONCE DAILY at the same time every day. °You may take it either with or without food. ° °If you have difficulty swallowing the tablet whole, you may crush it and mix in applesauce just prior to taking your dose. ° °Take Xarelto® exactly as prescribed by your doctor and DO NOT stop taking Xarelto® without talking to the doctor who prescribed the medication.  Stopping without other VTE prevention medication to take the place of Xarelto® may increase your risk of developing a clot. ° °After discharge, you should have regular check-up appointments with your healthcare provider that is prescribing your Xarelto®.   ° °What do you do if you miss a dose? °If you miss a dose, take it as soon as you remember on the same day then continue your regularly scheduled once daily regimen the next day. Do not take two doses of Xarelto® on the same day.  ° °Important Safety Information °A possible side effect of Xarelto® is bleeding. You should call your healthcare provider right away if you experience any of the following: °? Bleeding from an injury or your  nose that does not stop. °? Unusual colored urine (red or dark brown) or unusual colored stools (red or black). °? Unusual bruising for unknown reasons. °? A serious fall or if you hit your head (even if there is no bleeding). ° °Some medicines may interact with Xarelto® and might increase your risk of bleeding while on Xarelto®. To help avoid this, consult your healthcare provider or pharmacist prior to using any new prescription or non-prescription medications, including herbals, vitamins, non-steroidal anti-inflammatory drugs (NSAIDs) and supplements. ° °This website has more information on Xarelto®: www.xarelto.com. ° ° ° °

## 2015-11-10 NOTE — Progress Notes (Signed)
RT placed patient on CPAP. Patient setting is 5-20 cmH2O. Sterile water was added to water chamber for humidification. )Patient is tolerating well. RT will continue to monitor.

## 2015-11-10 NOTE — Progress Notes (Signed)
Physical Therapy Treatment Patient Details Name: Micheal Morris MRN: [id number] DOB: [birth year] Today's Date: 11/10/2015    History of Present Illness s/p L TKA    PT Comments    POD # 1 pm session Assisted with amb a greater distance then back to bed.  Performed some TE's then applied ICE.   Follow Up Recommendations  Home health PT     Equipment Recommendations  Rolling walker with 5" wheels    Recommendations for Other Services       Precautions / Restrictions      Mobility  Bed Mobility Overal bed mobility: Needs Assistance Bed Mobility: Supine to Sit     Supine to sit: Mod assist     General bed mobility comments: Mod Assist to support L LE and Mod Assist for upper body.    Transfers Overall transfer level: Needs assistance Equipment used: Rolling walker (2 wheeled) Transfers: Sit to/from Stand Sit to Stand: Min assist         General transfer comment: elvated bed and VC's on proper hand placement with stand to sit.    Ambulation/Gait Ambulation/Gait assistance: Min assist;Min guard Ambulation Distance (Feet): 45 Feet Assistive device: Rolling walker (2 wheeled) Gait Pattern/deviations: Step-to pattern Gait velocity: decreased   General Gait Details: cues for  sequence and upright posture.  Tolerated increased distance.     Stairs            Wheelchair Mobility    Modified Rankin (Stroke Patients Only)       Balance                                    Cognition                            Exercises      General Comments        Pertinent Vitals/Pain      Home Living                      Prior Function            PT Goals (current goals can now be found in the care plan section)      Frequency  7X/week    PT Plan Current plan remains appropriate    Co-evaluation             End of Session Equipment Utilized During Treatment: Gait belt;Left knee immobilizer Activity  Tolerance: Patient tolerated treatment well Patient left: with call bell/phone within reach;in chair;with chair alarm set;with family/visitor present     Time: 1315-1340 PT Time Calculation (min) (ACUTE ONLY): 25 min  Charges:  $Gait Training: 8-22 mins $Therapeutic Activity: 8-22 mins                    G Codes:      Rica Koyanagi  PTA [facility]  Acute  Rehab Pager      [phone]

## 2015-11-11 LAB — CBC
HCT: 37.4 % — ABNORMAL LOW (ref 39.0–52.0)
Hemoglobin: 12.6 g/dL — ABNORMAL LOW (ref 13.0–17.0)
MCH: 26.2 pg (ref 26.0–34.0)
MCHC: 33.7 g/dL (ref 30.0–36.0)
MCV: 77.8 fL — ABNORMAL LOW (ref 78.0–100.0)
PLATELETS: 268 10*3/uL (ref 150–400)
RBC: 4.81 MIL/uL (ref 4.22–5.81)
RDW: 14.2 % (ref 11.5–15.5)
WBC: 18.7 10*3/uL — ABNORMAL HIGH (ref 4.0–10.5)

## 2015-11-11 LAB — GLUCOSE, CAPILLARY
GLUCOSE-CAPILLARY: 140 mg/dL — AB (ref 65–99)
Glucose-Capillary: 188 mg/dL — ABNORMAL HIGH (ref 65–99)

## 2015-11-11 LAB — BASIC METABOLIC PANEL
Anion gap: 9 (ref 5–15)
BUN: 38 mg/dL — AB (ref 6–20)
CALCIUM: 9.1 mg/dL (ref 8.9–10.3)
CHLORIDE: 103 mmol/L (ref 101–111)
CO2: 27 mmol/L (ref 22–32)
Creatinine, Ser: 1.63 mg/dL — ABNORMAL HIGH (ref 0.61–1.24)
GFR calc Af Amer: 48 mL/min — ABNORMAL LOW (ref >60)
GFR, EST NON AFRICAN AMERICAN: 42 mL/min — AB (ref >60)
GLUCOSE: 133 mg/dL — AB (ref 65–99)
Potassium: 4.7 mmol/L (ref 3.5–5.1)
SODIUM: 139 mmol/L (ref 135–145)

## 2015-11-11 NOTE — Progress Notes (Signed)
   Subjective: 2 Days Post-Op Procedure(s) (LRB): LEFT TOTAL KNEE ARTHROPLASTY (Left) Patient reports pain as mild and moderate.   Patient seen in rounds with Dr. Wynelle Link.  Wife in room. Patient is well, but has had some minor complaints of pain in the knee, requiring pain medications Patient is ready to go home later today  Objective: Vital signs in last 24 hours: Temp:  [97.4 F (36.3 C)-98 F (36.7 C)] 97.8 F (36.6 C) (04/12 0554) Pulse Rate:  [77-90] 77 (04/12 0554) Resp:  [16-17] 16 (04/12 0554) BP: (139-162)/(69-75) 144/75 mmHg (04/12 0554) SpO2:  [100 %] 100 % (04/12 0554)  Intake/Output from previous day:  Intake/Output Summary (Last 24 hours) at 11/11/15 0702 Last data filed at 11/11/15 0555  Gross per 24 hour  Intake   1560 ml  Output   2725 ml  Net  -1165 ml    Labs:  Recent Labs  11/10/15 0355 11/11/15 0353  HGB 12.8* 12.6*    Recent Labs  11/10/15 0355 11/11/15 0353  WBC 17.1* 18.7*  RBC 4.99 4.81  HCT 39.0 37.4*  PLT 271 268    Recent Labs  11/10/15 0355 11/11/15 0353  NA 139 139  K 5.0 4.7  CL 105 103  CO2 24 27  BUN 33* 38*  CREATININE 1.56* 1.63*  GLUCOSE 219* 133*  CALCIUM 8.8* 9.1   No results for input(s): LABPT, INR in the last 72 hours.  EXAM: General - Patient is Alert, Appropriate and Oriented Extremity - Neurovascular intact Sensation intact distally Dorsiflexion/Plantar flexion intact Incision - clean, dry, no drainage Motor Function - intact, moving foot and toes well on exam.   Assessment/Plan: 2 Days Post-Op Procedure(s) (LRB): LEFT TOTAL KNEE ARTHROPLASTY (Left) Procedure(s) (LRB): LEFT TOTAL KNEE ARTHROPLASTY (Left) Past Medical History  Diagnosis Date  . Erectile dysfunction   . HTN (hypertension)   . Hyperlipidemia   . Type II diabetes mellitus ([place])   . Chronic kidney disease   . Sensory hearing loss, bilateral   . Restrictive lung disease   . Obesity   . Pain of left upper arm   . Arthritis   .  OSA (obstructive sleep apnea)     pt doesnt know settings  . SOBOE (shortness of breath on exertion) 11/03/15    currently goes to gym  . Neuromuscular disorder ([facility])     neuropathy   Principal Problem:   OA (osteoarthritis) of knee  Estimated body mass index is 39.17 kg/(m^2) as calculated from the following:   Height as of this encounter: 5\' 10"  (1.778 m).   Weight as of this encounter: 123.832 kg (273 lb). Up with therapy Discharge home with home health Diet - Cardiac diet, Diabetic diet and Renal diet Follow up - in 2 weeks Activity - WBAT Disposition - Home Condition Upon Discharge - Good D/C Meds - See DC Summary DVT Prophylaxis - Xarelto  [name], PA-C Orthopaedic Surgery 11/11/2015, 7:02 AM

## 2015-11-11 NOTE — Progress Notes (Signed)
Occupational Therapy Treatment Patient Details Name: Micheal Morris MRN: [id number] DOB: 1946-05-06 Today's Date: 11/11/2015    History of present illness s/p L TKA   OT comments  Completed education this session; no further OT needs  Follow Up Recommendations  Supervision/Assistance - 24 hour    Equipment Recommendations  3 in 1 bedside comode    Recommendations for Other Services      Precautions / Restrictions Precautions Precautions: Fall;Knee Required Braces or Orthoses: Knee Immobilizer - Left Knee Immobilizer - Left: Discontinue once straight leg raise with < 10 degree lag Restrictions Weight Bearing Restrictions: No       Mobility Bed Mobility         Supine to sit: Min assist     General bed mobility comments: for LLE  Transfers   Equipment used: Rolling walker (2 wheeled) Transfers: Sit to/from Stand Sit to Stand: Min guard         General transfer comment: elevated bed/ cues for UE/LE placement    Balance                                   ADL Overall ADL's : Needs assistance/impaired     Grooming: Wash/dry hands;Supervision/safety;Standing                                 General ADL Comments: wife donned KI. Reinforced use for when up and walking as well as precaution of keeping knee straight.    Pt ambulated around bed to sink and then transferred to chair.  Showed them tub transfer bench, but tub is upstairs.  Pt will sponge bathe initially, and he will likely be able to step over tub when he can walk up flight of stairs.        Vision                     Perception     Praxis      Cognition   Behavior During Therapy: WFL for tasks assessed/performed Overall Cognitive Status: Within Functional Limits for tasks assessed                       Extremity/Trunk Assessment               Exercises     Shoulder Instructions       General Comments      Pertinent Vitals/  Pain       Pain Score: 2  Pain Location: L knee Pain Descriptors / Indicators: Sore Pain Intervention(s): Limited activity within patient's tolerance;Monitored during session;Premedicated before session;Ice applied;Repositioned  Home Living                                          Prior Functioning/Environment              Frequency Min 2X/week     Progress Toward Goals  OT Goals(current goals can now be found in the care plan section)  Progress towards OT goals: pt/wife verbalized understanding of all education  Acute Rehab OT Goals Patient Stated Goal: return to I  Plan      Co-evaluation  End of Session CPM Left Knee CPM Left Knee: Off   Activity Tolerance Patient tolerated treatment well   Patient Left in chair;with call bell/phone within reach;with chair alarm set;with family/visitor present   Nurse Communication          Time: 0939-1000 OT Time Calculation (min): 21 min  Charges: OT General Charges $OT Visit: 1 Procedure OT Treatments $Self Care/Home Management : 8-22 mins  Micheal Morris 11/11/2015, 10:22 AM  Micheal Morris, OTR/L [phone] 11/11/2015

## 2015-11-11 NOTE — Progress Notes (Signed)
Physical Therapy Treatment Patient Details Name: Micheal Morris MRN: [id number] DOB: 05-May-1946 Today's Date: 11/11/2015    History of Present Illness s/p L TKA    PT Comments    POD # 2 am session Applied KI and instructed on KI use for amb and stairs.  Assisted with amb a greater distance.  Practiced 2 steps up backward with RW due to no rails.  Handout also given.  Returned to room to perform TKR TE's following handout HEP.  Pt instructed on proper tech and freq as well as use of ICE.   Follow Up Recommendations  Home health PT     Equipment Recommendations  Rolling walker with 5" wheels    Recommendations for Other Services       Precautions / Restrictions Precautions Precautions: Fall;Knee Precaution Comments: Instructed on KI use for amb and applied Required Braces or Orthoses: Knee Immobilizer - Left Knee Immobilizer - Left: Discontinue once straight leg raise with < 10 degree lag Restrictions Weight Bearing Restrictions: No    Mobility  Bed Mobility               General bed mobility comments: Pt OOB in recliner  Transfers Overall transfer level: Needs assistance Equipment used: Rolling walker (2 wheeled) Transfers: Sit to/from Stand Sit to Stand: Supervision;Min guard         General transfer comment: VC's on B hand placement and increased time to Power up  Ambulation/Gait Ambulation/Gait assistance: Supervision;Min guard Ambulation Distance (Feet): 45 Feet Assistive device: Rolling walker (2 wheeled) Gait Pattern/deviations: Step-to pattern Gait velocity: decreased   General Gait Details: cues for  sequence and upright posture.  Tolerated increased distance.     Stairs Stairs: Yes Stairs assistance: Min assist Stair Management: No rails;Step to pattern;Backwards;With walker Number of Stairs: 2 General stair comments: 50% VC's on proper tech and walker placement  Wheelchair Mobility    Modified Rankin (Stroke Patients Only)        Balance                                    Cognition Arousal/Alertness: Awake/alert Behavior During Therapy: WFL for tasks assessed/performed Overall Cognitive Status: Within Functional Limits for tasks assessed                      Exercises   Total Knee Replacement TE's 10 reps B LE ankle pumps 10 reps towel squeezes 10 reps knee presses 10 reps heel slides  10 reps SAQ's 10 reps SLR's 10 reps ABD Followed by ICE     General Comments        Pertinent Vitals/Pain Pain Assessment: 0-10 Pain Score: 4  Pain Location: L knee Pain Descriptors / Indicators: Sore Pain Intervention(s): Monitored during session;Premedicated before session;Repositioned;Ice applied    Home Living                      Prior Function            PT Goals (current goals can now be found in the care plan section) Progress towards PT goals: Progressing toward goals    Frequency       PT Plan Current plan remains appropriate    Co-evaluation             End of Session Equipment Utilized During Treatment: Gait belt;Left knee immobilizer Activity Tolerance: Patient tolerated treatment well  Patient left: in chair;with call bell/phone within reach;with chair alarm set     Time: 1010-1100 PT Time Calculation (min) (ACUTE ONLY): 50 min  Charges:  $Gait Training: 8-22 mins $Therapeutic Exercise: 8-22 mins $Therapeutic Activity: 8-22 mins                    G Codes:      Rica Koyanagi  PTA [facility]  Acute  Rehab Pager      [phone]

## 2015-11-11 NOTE — Progress Notes (Signed)
Physical Therapy Treatment Patient Details Name: Micheal Morris MRN: [id number] DOB: Dec 17, 1945 Today's Date: 11/11/2015    History of Present Illness s/p L TKA    PT Comments    POD # 2 pm session Spouse present for eduaction on safe handling, HEP and stairs.  Hands on instruction, had spouse assist pt going up 2 steps backward with walker due to no rails.  Instructed to wear KI for stairs and amb until able to perform 10 active SLR.  Instructed on use of ICE.   Pt ready for D/C to home.    Follow Up Recommendations  Home health PT     Equipment Recommendations  Rolling walker with 5" wheels    Recommendations for Other Services       Precautions / Restrictions Precautions Precautions: Fall;Knee Precaution Comments: Instructed on KI use for amb and applied Required Braces or Orthoses: Knee Immobilizer - Left Knee Immobilizer - Left: Discontinue once straight leg raise with < 10 degree lag Restrictions Weight Bearing Restrictions: No    Mobility  Bed Mobility               General bed mobility comments: Pt OOB in recliner  Transfers Overall transfer level: Needs assistance Equipment used: Rolling walker (2 wheeled) Transfers: Sit to/from Stand Sit to Stand: Supervision;Min guard         General transfer comment: VC's on B hand placement and increased time to Power up  Ambulation/Gait Ambulation/Gait assistance: Supervision;Min guard Ambulation Distance (Feet): 45 Feet Assistive device: Rolling walker (2 wheeled) Gait Pattern/deviations: Step-to pattern Gait velocity: decreased   General Gait Details: cues for  sequence and upright posture.  Tolerated increased distance.     Stairs Stairs: Yes Stairs assistance: Min assist Stair Management: No rails;Step to pattern;Backwards;With walker Number of Stairs: 2 General stair comments: 50% VC's on proper tech and walker placement  Wheelchair Mobility    Modified Rankin (Stroke Patients Only)        Balance                                    Cognition Arousal/Alertness: Awake/alert Behavior During Therapy: WFL for tasks assessed/performed Overall Cognitive Status: Within Functional Limits for tasks assessed                      Exercises      General Comments        Pertinent Vitals/Pain Pain Assessment: 0-10 Pain Score: 4  Pain Location: L knee Pain Descriptors / Indicators: Sore Pain Intervention(s): Monitored during session;Premedicated before session;Repositioned;Ice applied    Home Living                      Prior Function            PT Goals (current goals can now be found in the care plan section) Progress towards PT goals: Progressing toward goals    Frequency       PT Plan Current plan remains appropriate    Co-evaluation             End of Session Equipment Utilized During Treatment: Gait belt;Left knee immobilizer Activity Tolerance: Patient tolerated treatment well Patient left: in chair;with call bell/phone within reach;with chair alarm set     Time: [id number] PT Time Calculation (min) (ACUTE ONLY): 18 min  Charges:  $Gait Training: 8-22 mins  G Codes:      Rica Koyanagi  PTA [facility]  Acute  Rehab Pager      [phone]

## 2015-11-18 ENCOUNTER — Ambulatory Visit: Payer: Medicare Other | Admitting: Podiatry

## 2015-12-17 ENCOUNTER — Encounter: Payer: Self-pay | Admitting: Podiatry

## 2015-12-17 ENCOUNTER — Ambulatory Visit (INDEPENDENT_AMBULATORY_CARE_PROVIDER_SITE_OTHER): Payer: Medicare Other | Admitting: Podiatry

## 2015-12-17 DIAGNOSIS — M79673 Pain in unspecified foot: Secondary | ICD-10-CM | POA: Diagnosis not present

## 2015-12-17 DIAGNOSIS — E119 Type 2 diabetes mellitus without complications: Secondary | ICD-10-CM

## 2015-12-17 DIAGNOSIS — B351 Tinea unguium: Secondary | ICD-10-CM | POA: Diagnosis not present

## 2015-12-17 NOTE — Progress Notes (Signed)
Patient ID: Micheal Morris, male   DOB: 01/20/1946, 70 y.o.   MRN: [id number] Complaint:  Visit Type: Patient returns to my office for continued preventative foot care services. 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 The patient presents for preventative foot care services. No changes to ROS  Podiatric Exam: Vascular: dorsalis pedis and posterior tibial pulses are palpable bilateral. Capillary return is immediate. Temperature gradient is WNL. Skin turgor WNL  Sensorium: Normal Semmes Weinstein monofilament test. Normal tactile sensation bilaterally. Nail Exam: Pt has thick disfigured discolored nails with subungual debris noted bilateral entire nail hallux through fifth toenails Ulcer Exam: There is no evidence of ulcer or pre-ulcerative changes or infection. Orthopedic Exam: Muscle tone and strength are WNL. No limitations in general ROM. No crepitus or effusions noted. Foot type and digits show no abnormalities. Bony prominences are unremarkable. Skin: No Porokeratosis. No infection or ulcers  Diagnosis:  Onychomycosis, , Pain in right toe, pain in left toes  Treatment & Plan Procedures and Treatment: Consent by patient was obtained for treatment procedures. The patient understood the discussion of treatment and procedures well. All questions were answered thoroughly reviewed. Debridement of mycotic and hypertrophic toenails, 1 through 5 bilateral and clearing of subungual debris. No ulceration, no infection noted.  Return Visit-Office Procedure: Patient instructed to return to the office for a follow up visit 3 months for continued evaluation and treatment.  Gardiner Barefoot DPM

## 2015-12-31 ENCOUNTER — Observation Stay (HOSPITAL_COMMUNITY)
Admission: EM | Admit: 2015-12-31 | Discharge: 2016-01-02 | Disposition: A | Discharge status: Home / Self Care | Payer: Medicare Other | Attending: Internal Medicine | Admitting: Internal Medicine

## 2015-12-31 ENCOUNTER — Encounter (HOSPITAL_COMMUNITY): Payer: Self-pay | Admitting: Emergency Medicine

## 2015-12-31 ENCOUNTER — Emergency Department (HOSPITAL_COMMUNITY): Payer: Medicare Other

## 2015-12-31 DIAGNOSIS — R6510 Systemic inflammatory response syndrome (SIRS) of non-infectious origin without acute organ dysfunction: Secondary | ICD-10-CM | POA: Diagnosis present

## 2015-12-31 DIAGNOSIS — H409 Unspecified glaucoma: Secondary | ICD-10-CM | POA: Diagnosis present

## 2015-12-31 DIAGNOSIS — R112 Nausea with vomiting, unspecified: Principal | ICD-10-CM | POA: Diagnosis present

## 2015-12-31 DIAGNOSIS — Z96652 Presence of left artificial knee joint: Secondary | ICD-10-CM | POA: Diagnosis not present

## 2015-12-31 DIAGNOSIS — K529 Noninfective gastroenteritis and colitis, unspecified: Secondary | ICD-10-CM | POA: Insufficient documentation

## 2015-12-31 DIAGNOSIS — E1122 Type 2 diabetes mellitus with diabetic chronic kidney disease: Secondary | ICD-10-CM | POA: Insufficient documentation

## 2015-12-31 DIAGNOSIS — E669 Obesity, unspecified: Secondary | ICD-10-CM | POA: Insufficient documentation

## 2015-12-31 DIAGNOSIS — Z79899 Other long term (current) drug therapy: Secondary | ICD-10-CM | POA: Insufficient documentation

## 2015-12-31 DIAGNOSIS — I129 Hypertensive chronic kidney disease with stage 1 through stage 4 chronic kidney disease, or unspecified chronic kidney disease: Secondary | ICD-10-CM | POA: Diagnosis not present

## 2015-12-31 DIAGNOSIS — Z888 Allergy status to other drugs, medicaments and biological substances status: Secondary | ICD-10-CM | POA: Diagnosis not present

## 2015-12-31 DIAGNOSIS — R1110 Vomiting, unspecified: Secondary | ICD-10-CM | POA: Diagnosis present

## 2015-12-31 DIAGNOSIS — Z7982 Long term (current) use of aspirin: Secondary | ICD-10-CM | POA: Diagnosis not present

## 2015-12-31 DIAGNOSIS — N289 Disorder of kidney and ureter, unspecified: Secondary | ICD-10-CM | POA: Diagnosis present

## 2015-12-31 DIAGNOSIS — Z886 Allergy status to analgesic agent status: Secondary | ICD-10-CM | POA: Diagnosis not present

## 2015-12-31 DIAGNOSIS — G4733 Obstructive sleep apnea (adult) (pediatric): Secondary | ICD-10-CM | POA: Diagnosis present

## 2015-12-31 DIAGNOSIS — R109 Unspecified abdominal pain: Secondary | ICD-10-CM | POA: Insufficient documentation

## 2015-12-31 DIAGNOSIS — E785 Hyperlipidemia, unspecified: Secondary | ICD-10-CM | POA: Diagnosis not present

## 2015-12-31 DIAGNOSIS — N183 Chronic kidney disease, stage 3 unspecified: Secondary | ICD-10-CM | POA: Diagnosis present

## 2015-12-31 DIAGNOSIS — E875 Hyperkalemia: Secondary | ICD-10-CM | POA: Diagnosis not present

## 2015-12-31 DIAGNOSIS — K3184 Gastroparesis: Secondary | ICD-10-CM | POA: Insufficient documentation

## 2015-12-31 DIAGNOSIS — R1012 Left upper quadrant pain: Secondary | ICD-10-CM | POA: Diagnosis present

## 2015-12-31 DIAGNOSIS — Z6835 Body mass index (BMI) 35.0-35.9, adult: Secondary | ICD-10-CM | POA: Insufficient documentation

## 2015-12-31 DIAGNOSIS — Z7901 Long term (current) use of anticoagulants: Secondary | ICD-10-CM | POA: Diagnosis not present

## 2015-12-31 DIAGNOSIS — E1165 Type 2 diabetes mellitus with hyperglycemia: Secondary | ICD-10-CM

## 2015-12-31 DIAGNOSIS — N390 Urinary tract infection, site not specified: Secondary | ICD-10-CM | POA: Diagnosis not present

## 2015-12-31 DIAGNOSIS — I10 Essential (primary) hypertension: Secondary | ICD-10-CM | POA: Diagnosis present

## 2015-12-31 DIAGNOSIS — Z87891 Personal history of nicotine dependence: Secondary | ICD-10-CM | POA: Insufficient documentation

## 2015-12-31 DIAGNOSIS — K5909 Other constipation: Secondary | ICD-10-CM | POA: Diagnosis not present

## 2015-12-31 DIAGNOSIS — K760 Fatty (change of) liver, not elsewhere classified: Secondary | ICD-10-CM | POA: Diagnosis not present

## 2015-12-31 DIAGNOSIS — K449 Diaphragmatic hernia without obstruction or gangrene: Secondary | ICD-10-CM | POA: Insufficient documentation

## 2015-12-31 DIAGNOSIS — D3500 Benign neoplasm of unspecified adrenal gland: Secondary | ICD-10-CM | POA: Diagnosis present

## 2015-12-31 DIAGNOSIS — H905 Unspecified sensorineural hearing loss: Secondary | ICD-10-CM | POA: Diagnosis not present

## 2015-12-31 DIAGNOSIS — E119 Type 2 diabetes mellitus without complications: Secondary | ICD-10-CM

## 2015-12-31 DIAGNOSIS — E1143 Type 2 diabetes mellitus with diabetic autonomic (poly)neuropathy: Secondary | ICD-10-CM | POA: Diagnosis not present

## 2015-12-31 DIAGNOSIS — E1142 Type 2 diabetes mellitus with diabetic polyneuropathy: Secondary | ICD-10-CM | POA: Diagnosis present

## 2015-12-31 DIAGNOSIS — R0602 Shortness of breath: Secondary | ICD-10-CM | POA: Diagnosis not present

## 2015-12-31 DIAGNOSIS — Z794 Long term (current) use of insulin: Secondary | ICD-10-CM | POA: Insufficient documentation

## 2015-12-31 DIAGNOSIS — N62 Hypertrophy of breast: Secondary | ICD-10-CM | POA: Insufficient documentation

## 2015-12-31 DIAGNOSIS — E278 Other specified disorders of adrenal gland: Secondary | ICD-10-CM

## 2015-12-31 DIAGNOSIS — M1990 Unspecified osteoarthritis, unspecified site: Secondary | ICD-10-CM | POA: Insufficient documentation

## 2015-12-31 HISTORY — DX: Obstructive sleep apnea (adult) (pediatric): G47.33

## 2015-12-31 HISTORY — DX: Dependence on other enabling machines and devices: Z99.89

## 2015-12-31 LAB — CBC
HCT: 41.4 % (ref 39.0–52.0)
Hemoglobin: 13 g/dL (ref 13.0–17.0)
MCH: 24.8 pg — ABNORMAL LOW (ref 26.0–34.0)
MCHC: 31.4 g/dL (ref 30.0–36.0)
MCV: 78.9 fL (ref 78.0–100.0)
Platelets: 366 10*3/uL (ref 150–400)
RBC: 5.25 MIL/uL (ref 4.22–5.81)
RDW: 14.6 % (ref 11.5–15.5)
WBC: 15.4 10*3/uL — AB (ref 4.0–10.5)

## 2015-12-31 LAB — COMPREHENSIVE METABOLIC PANEL
ALK PHOS: 109 U/L (ref 38–126)
ALT: 17 U/L (ref 17–63)
AST: 23 U/L (ref 15–41)
Albumin: 3.6 g/dL (ref 3.5–5.0)
Anion gap: 11 (ref 5–15)
BILIRUBIN TOTAL: 0.6 mg/dL (ref 0.3–1.2)
BUN: 21 mg/dL — ABNORMAL HIGH (ref 6–20)
CALCIUM: 9.4 mg/dL (ref 8.9–10.3)
CO2: 25 mmol/L (ref 22–32)
CREATININE: 1.66 mg/dL — AB (ref 0.61–1.24)
Chloride: 102 mmol/L (ref 101–111)
GFR, EST AFRICAN AMERICAN: 47 mL/min — AB (ref >60)
GFR, EST NON AFRICAN AMERICAN: 41 mL/min — AB (ref >60)
Glucose, Bld: 175 mg/dL — ABNORMAL HIGH (ref 65–99)
Potassium: 4.2 mmol/L (ref 3.5–5.1)
Sodium: 138 mmol/L (ref 135–145)
Total Protein: 7.5 g/dL (ref 6.5–8.1)

## 2015-12-31 LAB — URINALYSIS, ROUTINE W REFLEX MICROSCOPIC
Bilirubin Urine: NEGATIVE
Glucose, UA: >1000 mg/dL — AB
HGB URINE DIPSTICK: NEGATIVE
KETONES UR: 15 mg/dL — AB
Nitrite: NEGATIVE
PROTEIN: NEGATIVE mg/dL
Specific Gravity, Urine: 1.022 (ref 1.005–1.030)
pH: 7 (ref 5.0–8.0)

## 2015-12-31 LAB — DIFFERENTIAL
BASOS ABS: 0 10*3/uL (ref 0.0–0.1)
BASOS PCT: 0 %
Eosinophils Absolute: 0 10*3/uL (ref 0.0–0.7)
Eosinophils Relative: 0 %
LYMPHS PCT: 5 %
Lymphs Abs: 0.7 10*3/uL (ref 0.7–4.0)
MONO ABS: 0.3 10*3/uL (ref 0.1–1.0)
MONOS PCT: 2 %
NEUTROS ABS: 13.8 10*3/uL — AB (ref 1.7–7.7)
Neutrophils Relative %: 93 %

## 2015-12-31 LAB — URINE MICROSCOPIC-ADD ON: RBC / HPF: NONE SEEN RBC/hpf (ref 0–5)

## 2015-12-31 LAB — CBG MONITORING, ED: GLUCOSE-CAPILLARY: 182 mg/dL — AB (ref 65–99)

## 2015-12-31 LAB — LIPASE, BLOOD: Lipase: 19 U/L (ref 11–51)

## 2015-12-31 MED ORDER — ONDANSETRON HCL 4 MG/2ML IJ SOLN
4.0000 mg | Freq: Once | INTRAMUSCULAR | Status: AC
  Administered 2015-12-31: 4 mg via INTRAVENOUS
  Filled 2015-12-31: qty 2

## 2015-12-31 MED ORDER — HYDROMORPHONE HCL 1 MG/ML IJ SOLN
1.0000 mg | Freq: Once | INTRAMUSCULAR | Status: AC
  Administered 2015-12-31: 1 mg via INTRAVENOUS
  Filled 2015-12-31: qty 1

## 2015-12-31 MED ORDER — CIPROFLOXACIN IN D5W 400 MG/200ML IV SOLN
400.0000 mg | Freq: Two times a day (BID) | INTRAVENOUS | Status: DC
  Administered 2016-01-01 – 2016-01-02 (×4): 400 mg via INTRAVENOUS
  Filled 2015-12-31 (×4): qty 200

## 2015-12-31 MED ORDER — METRONIDAZOLE IN NACL 5-0.79 MG/ML-% IV SOLN
500.0000 mg | Freq: Three times a day (TID) | INTRAVENOUS | Status: DC
  Administered 2015-12-31 – 2016-01-02 (×5): 500 mg via INTRAVENOUS
  Filled 2015-12-31 (×5): qty 100

## 2015-12-31 MED ORDER — ONDANSETRON HCL 4 MG/2ML IJ SOLN
4.0000 mg | Freq: Four times a day (QID) | INTRAMUSCULAR | Status: DC | PRN
  Administered 2016-01-01: 4 mg via INTRAVENOUS
  Filled 2015-12-31: qty 2

## 2015-12-31 MED ORDER — SODIUM CHLORIDE 0.9% FLUSH
3.0000 mL | Freq: Two times a day (BID) | INTRAVENOUS | Status: DC
Start: 2015-12-31 — End: 2016-01-02
  Administered 2015-12-31 – 2016-01-01 (×2): 3 mL via INTRAVENOUS

## 2015-12-31 MED ORDER — METOCLOPRAMIDE HCL 5 MG/ML IJ SOLN
10.0000 mg | Freq: Four times a day (QID) | INTRAMUSCULAR | Status: DC
  Administered 2015-12-31 – 2016-01-02 (×6): 10 mg via INTRAVENOUS
  Filled 2015-12-31 (×7): qty 2

## 2015-12-31 MED ORDER — INSULIN ASPART 100 UNIT/ML ~~LOC~~ SOLN
0.0000 [IU] | Freq: Three times a day (TID) | SUBCUTANEOUS | Status: DC
  Administered 2016-01-01: 15 [IU] via SUBCUTANEOUS
  Administered 2016-01-01: 2 [IU] via SUBCUTANEOUS
  Administered 2016-01-01: 15 [IU] via SUBCUTANEOUS
  Administered 2016-01-02: 2 [IU] via SUBCUTANEOUS

## 2015-12-31 MED ORDER — IOPAMIDOL (ISOVUE-300) INJECTION 61%
INTRAVENOUS | Status: AC
  Administered 2015-12-31: 80 mL
  Filled 2015-12-31: qty 75

## 2015-12-31 MED ORDER — ONDANSETRON 4 MG PO TBDP
4.0000 mg | ORAL_TABLET | Freq: Once | ORAL | Status: AC | PRN
  Administered 2015-12-31: 4 mg via ORAL

## 2015-12-31 MED ORDER — METOCLOPRAMIDE HCL 5 MG/ML IJ SOLN
10.0000 mg | Freq: Once | INTRAMUSCULAR | Status: AC
  Administered 2015-12-31: 10 mg via INTRAVENOUS
  Filled 2015-12-31: qty 2

## 2015-12-31 MED ORDER — ONDANSETRON 4 MG PO TBDP
ORAL_TABLET | ORAL | Status: AC
  Filled 2015-12-31: qty 1

## 2015-12-31 MED ORDER — HYDROMORPHONE HCL 1 MG/ML IJ SOLN
1.0000 mg | INTRAMUSCULAR | Status: DC | PRN
  Administered 2016-01-01 (×3): 1 mg via INTRAVENOUS
  Filled 2015-12-31 (×3): qty 1

## 2015-12-31 MED ORDER — SODIUM CHLORIDE 0.9 % IV SOLN
INTRAVENOUS | Status: DC
  Administered 2015-12-31: via INTRAVENOUS

## 2015-12-31 NOTE — ED Provider Notes (Signed)
CSN: [id number]     Arrival date & time 12/31/15  1422 History   First MD Initiated Contact with Patient 12/31/15 1503     Chief Complaint  Patient presents with  . Abdominal Pain     (Consider location/radiation/quality/duration/timing/severity/associated sxs/prior Treatment) HPI  Pt with hx of DM, HTN presenting with c/o left sided upper abdominal pain as well as vomiting. He states the pain started one week ago- he saw his PMD who started him on cipro and flagyl for possible diverticulitis- he states that yesterday the pain became much worse with multiple episodes of emesis.  He describes waves of intense pain with vomiting.  Emesis is nonbloody and nonbilious.  No change in stools- no melena.  No fever/chills.  He has not been able to eat or drink since yesterday.  States he has a hx of similar pain but did not receive a diagnosis.  Pain is severe at time.  There are no other associated systemic symptoms, there are no other alleviating or modifying factors.   Past Medical History  Diagnosis Date  . Erectile dysfunction   . HTN (hypertension)   . Hyperlipidemia   . Type II diabetes mellitus ([place])   . Chronic kidney disease   . Sensory hearing loss, bilateral   . Restrictive lung disease   . Obesity   . Pain of left upper arm   . Arthritis   . OSA (obstructive sleep apnea)     pt doesnt know settings  . SOBOE (shortness of breath on exertion) 11/03/15    currently goes to gym  . Neuromuscular disorder ([place])     neuropathy   Past Surgical History  Procedure Laterality Date  . Colonoscopy    . Laser of eyes      bilateral  . 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 Stroke Mother   . Diabetes Mother   . Hypertension Mother   . Hypertension Brother   . Diabetes Brother   . Diabetes Sister     Social History  Substance Use Topics  . Smoking status: Former Smoker -- 1.00 packs/day for 7 years    Types: Cigarettes    Quit date: 08/01/1974  . Smokeless tobacco: Never Used  . Alcohol Use: No    Review of Systems  ROS reviewed and all otherwise negative except for mentioned in HPI    Allergies  Nsaids and Aspirin  Home Medications   Prior to Admission medications   Medication Sig Start Date End Date Taking? Authorizing Provider  amLODipine-benazepril (LOTREL) 10-20 MG per capsule TAKE 1 CAPSULE BY MOUTH DAILY 09/04/14  Yes Historical Provider, MD  aspirin 81 MG chewable tablet Chew 81 mg by mouth daily.   Yes Historical Provider, MD  AZOPT 1 % ophthalmic suspension Place 1 drop into the right eye 2 (two) times daily.  04/25/15  Yes Historical Provider, MD  BD PEN NEEDLE NANO U/F 32G X 4 MM MISC Use as directed with insulin 09/14/14  Yes Historical Provider, MD  brinzolamide (AZOPT) 1 % ophthalmic suspension Place 1 drop into the right eye 2 (two) times daily. 04/25/15  Yes Historical Provider, MD  Canagliflozin (INVOKANA) 100 MG TABS Take 1 tablet by mouth daily. 01/21/14  Yes Historical Provider, MD  ciprofloxacin (CIPRO) 500 MG tablet Take 500 mg by  mouth 2 (two) times daily. 12/23/15  Yes Historical Provider, MD  COMBIGAN 0.2-0.5 % ophthalmic solution Place 1 drop into both eyes 2 (two) times daily.  04/12/15  Yes Historical Provider, MD  gabapentin (NEURONTIN) 300 MG capsule Take 600 mg by mouth 2 (two) times daily.    Yes Historical Provider, MD  insulin glargine (LANTUS) 100 UNIT/ML injection Inject 65 Units into the skin every morning.   Yes Historical Provider, MD  insulin lispro (HUMALOG) 100 UNIT/ML injection Inject 20-40 Units into the skin 3 (three) times daily. Per sliding scale   Yes Historical Provider, MD  Lancets (ONETOUCH ULTRASOFT) lancets Use 1 lancets three (3) times a day with insulin 09/16/14  Yes Historical Provider, MD  Liraglutide (VICTOZA) 18 MG/3ML SOPN  Inject 1.8 mg as directed daily.  01/03/14  Yes Historical Provider, MD  LUMIGAN 0.01 % SOLN Place 1 drop into both eyes at bedtime. 09/16/14  Yes Historical Provider, MD  metoprolol succinate (TOPROL-XL) 100 MG 24 hr tablet TAKE 1 TABLET BY MOUTH ONCE DAILY 03/31/14  Yes Historical Provider, MD  metroNIDAZOLE (FLAGYL) 500 MG tablet Take 500 mg by mouth 2 (two) times daily. 12/23/15  Yes Historical Provider, MD  mirabegron ER (MYRBETRIQ) 25 MG TB24 tablet Take 25 mg by mouth daily.   Yes Historical Provider, MD  Multiple Vitamin (MULTIVITAMIN) tablet Take 1 tablet by mouth daily. Reported on 12/31/2015   Yes Historical Provider, MD  simvastatin (ZOCOR) 20 MG tablet TAKE 1 TABLET BY MOUTH EVERY DAY AT BEDTIME 05/02/14  Yes Historical Provider, MD  spironolactone (ALDACTONE) 25 MG tablet TAKE 1 TABLET BY MOUTH EVERY DAY 09/04/14  Yes Historical Provider, MD  torsemide (DEMADEX) 20 MG tablet TAKE 1 TABLET EVERY DAY 06/04/14  Yes Historical Provider, MD  amLODipine-benazepril (LOTREL) 10-20 MG capsule Take 1 capsule by mouth daily. Reported on 12/31/2015 08/25/15   Historical Provider, MD  methocarbamol (ROBAXIN) 500 MG tablet Take 1 tablet (500 mg total) by mouth every 6 (six) hours as needed for muscle spasms. Patient not taking: Reported on 12/31/2015 11/10/15   [name], PA-C  oxyCODONE (OXY IR/ROXICODONE) 5 MG immediate release tablet Take 1-2 tablets (5-10 mg total) by mouth every 3 (three) hours as needed for moderate pain or severe pain. Patient not taking: Reported on 12/31/2015 11/10/15   [name], PA-C  rivaroxaban (XARELTO) 10 MG TABS tablet Take 1 tablet (10 mg total) by mouth daily with breakfast. Take Xarelto for two and a half more weeks, then discontinue Xarelto. Once the patient has completed the blood thinner regimen, then take a Baby 81 mg Aspirin daily for three more weeks. Patient not taking: Reported on 12/31/2015 11/10/15   [name], PA-C  traMADol (ULTRAM) 50 MG tablet Take 1-2 tablets  (50-100 mg total) by mouth every 6 (six) hours as needed (mild pain). Patient not taking: Reported on 12/31/2015 11/10/15   [name], PA-C   BP 143/79 mmHg  Pulse 102  Temp(Src) 97.3 F (36.3 C) (Oral)  Resp 16  Ht 5\' 10"  (1.778 m)  Wt 78.472 kg  BMI 24.82 kg/m2  SpO2 100%  Vitals reviewed Physical Exam  Physical Examination: General appearance - alert, uncomfortable appearing, and in no distress Mental status - alert, oriented to person, place, and time Eyes - no conjunctival injection no scleral icterus Mouth - mucous membranes moist, pharynx normal without lesions Chest - clear to auscultation, no wheezes, rales or rhonchi, symmetric air entry Heart - normal rate, regular rhythm, normal S1, S2,  no murmurs, rubs, clicks or gallops Abdomen - soft, tender to palpation in left upper abdomen, no gaurding or rebound tenderness, nondistended, no masses or organomegaly Neurological - alert, oriented, normal speech Extremities - peripheral pulses normal, no pedal edema, no clubbing or cyanosis Skin - normal coloration and turgor, no rashes  ED Course  Procedures (including critical care time) Labs Review Labs Reviewed  COMPREHENSIVE METABOLIC PANEL - Abnormal; Notable for the following:    Glucose, Bld 175 (*)    BUN 21 (*)    Creatinine, Ser 1.66 (*)    GFR calc non Af Amer 41 (*)    GFR calc Af Amer 47 (*)    All other components within normal limits  CBC - Abnormal; Notable for the following:    WBC 15.4 (*)    MCH 24.8 (*)    All other components within normal limits  URINALYSIS, ROUTINE W REFLEX MICROSCOPIC (NOT AT [facility]) - Abnormal; Notable for the following:    Glucose, UA >1000 (*)    Ketones, ur 15 (*)    Leukocytes, UA TRACE (*)    All other components within normal limits  URINE MICROSCOPIC-ADD ON - Abnormal; Notable for the following:    Squamous Epithelial / LPF 0-5 (*)    Bacteria, UA RARE (*)    All other components within normal limits  CBG MONITORING, ED -  Abnormal; Notable for the following:    Glucose-Capillary 182 (*)    All other components within normal limits  LIPASE, BLOOD  DIFFERENTIAL    Imaging Review Ct Abdomen Pelvis W Contrast  12/31/2015  CLINICAL DATA:  Left upper quadrant abdominal pain with nausea vomiting and chronic constipation. EXAM: CT ABDOMEN AND PELVIS WITH CONTRAST TECHNIQUE: Multidetector CT imaging of the abdomen and pelvis was performed using the standard protocol following bolus administration of intravenous contrast. CONTRAST:  19mL ISOVUE-300 IOPAMIDOL (ISOVUE-300) INJECTION 61% COMPARISON:  None. FINDINGS: Lower chest:  No acute findings.  Small hiatal hernia. Hepatobiliary: No masses or other significant abnormality. Pancreas: No mass, inflammatory changes, or other significant abnormality. Spleen: Within normal limits in size and appearance. Adrenals/Urinary Tract: No masses identified. No evidence of hydronephrosis. There is bilateral mild perirenal fat stranding. There is enlargement of bilateral adrenal glands. The left adrenal gland is noted to contain a tiny calcification inferiorly. Stomach/Bowel: No evidence of obstruction, inflammatory process, or abnormal fluid collections. The retrocecal appendix is normal. Vascular/Lymphatic: No pathologically enlarged lymph nodes. No evidence of abdominal aortic aneurysm. Moderate calcified atherosclerosis of the distal aorta and bilateral iliac arteries. There are shotty mesenteric lymph nodes. Reproductive: No mass or other significant abnormality. Other: None. Musculoskeletal:  No suspicious bone lesions identified. IMPRESSION: No acute findings within the abdomen or pelvis. Enlargement of bilateral adrenal glands with punctate calcification within the left adrenal gland. This may represent benign adrenal hypertrophy, however a true adrenal masses cannot be excluded. Please correlate to patient's clinical history. If no prior imaging of the adrenal glands is available, CT or MRI  of the abdomen adrenal protocol is recommended. Mild bilateral perirenal fat stranding, nonspecific. Electronically Signed   By: Fidela Salisbury M.D.   On: 12/31/2015 18:03   I have personally reviewed and evaluated these images and lab results as part of my medical decision-making.   EKG Interpretation   Date/Time:  Thursday December 31 2015 20:10:21 EDT Ventricular Rate:  102 PR Interval:  197 QRS Duration: 85 QT Interval:  349 QTC Calculation: 455 R Axis:   53 Text  Interpretation:  Sinus tachycardia Low voltage, extremity and  precordial leads Since previous tracing rate faster Confirmed by [facility]   MD, Shamariah Shewmake [phone]) on 12/31/2015 8:40:41 PM      MDM   Final diagnoses:  Left upper quadrant pain  Intractable vomiting with nausea, vomiting of unspecified type  Renal insufficiency    Pt presenting with epigastric abdominal pain as well as vomiting.  Labs are reassuring, abdominal CT is negative for acute findings.  Pt continues to have abdominal pain and emesis/dry heaves despite pain and nausea meds including reglan.  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  Pt admitted to medical service for further treatment.   4:34 PM on recheck pt states his nausea is improved but continues to have abdominal pain.   8:48 PM pt continues to have dry heaving and pain.  Plan for [facility] for further management  9:45 PM d/w Dr. Olevia Bowens, hospitalist for admission.    Alfonzo Beers, MD 12/31/15 2228

## 2015-12-31 NOTE — ED Notes (Signed)
Pt sat's dropped into 80's while sleeping.  Pt stated he wears oxygen at night for sleep apnea.  Pt placed on 2L and I utilized a pediatric pulse ox accomplishing a much better waveform.  Pt at 100% on 2L

## 2015-12-31 NOTE — H&P (Signed)
History and Physical    PROSPERO PIERSALL [id number] DOB: [birth year] DOA: 12/31/2015  PCP: Chesley Noon, MD   Patient coming from: Home.  Chief Complaint: Nausea and vomiting.  HPI: Micheal Morris is a 70 y.o. male with medical history significant of hypertension, hyperlipidemia, type 2 diabetes, chronic kidney disease, restrictive lung disease, obstructive sleep apnea, diabetic peripheral neuropathy who comes to the emergency department due to abdominal pain for a week and multiple episodes of nausea/vomiting since this morning.  Per patient, about a week ago, he was seen by his doctor due to this abdominal pain and was prescribed ciprofloxacin and metronidazole orally for presumed diverticulitis. He is states that he was slowly improving until Monday evening, when he felt increasing intensity on his abdominal pain. Then this morning, the patient started experiencing worsening of pain, intense nausea with multiple episodes of emesis since then. He describes the pain as colic-like and emesis is nonbilious without coffee grounds appearance. He denies bowel habit changes, melena or hematochezia. fever, chills, but feels fatigue. He denies productive cough, dyspnea, wheezing, chest pain, palpitations, diaphoresis or dizziness.  ED Course: Workup showed leukocytosis and CT scan of the abdomen did not show acute diverticulitis, but incidentally found                        a left adrenal gland calcification which will require follow-up.  Review of Systems: As per HPI otherwise 10 point review of systems negative.   Past Medical History  Diagnosis Date  . Erectile dysfunction   . HTN (hypertension)   . Hyperlipidemia   . Type II diabetes mellitus ([place])   . Chronic kidney disease   . Sensory hearing loss, bilateral   . Restrictive lung disease   . Obesity   . Pain of left upper arm   . Arthritis   . OSA (obstructive sleep apnea)     pt doesnt know settings  . SOBOE (shortness of  breath on exertion) 11/03/15    currently goes to gym  . Neuromuscular disorder ([place])     neuropathy    Past Surgical History  Procedure Laterality Date  . Colonoscopy    . Laser of eyes      bilateral  . 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 His smoking use included Cigarettes. He has a 7 pack-year smoking history. He has never used smokeless tobacco. He reports that he does not drink alcohol or use illicit drugs.  Allergies  Allergen Reactions  . Nsaids Other (See Comments)    GI BLEED  . Aspirin Other (See Comments)    STOMACH ULCER  STOMACH ULCER     Family History  Problem Relation Age of Onset  . Stroke Mother   . Diabetes Mother   . Hypertension Mother   . Hypertension Brother   . Diabetes Brother   . Diabetes Sister     Prior to Admission medications   Medication Sig Start Date End Date Taking? Authorizing Provider  amLODipine-benazepril (LOTREL) 10-20 MG per capsule TAKE 1 CAPSULE BY MOUTH DAILY 09/04/14  Yes Historical Provider, MD  aspirin 81 MG chewable tablet Chew 81 mg by mouth daily.   Yes Historical Provider, MD  AZOPT 1 % ophthalmic suspension Place 1 drop into the  right eye 2 (two) times daily.  04/25/15  Yes Historical Provider, MD  BD PEN NEEDLE NANO U/F 32G X 4 MM MISC Use as directed with insulin 09/14/14  Yes Historical Provider, MD  brinzolamide (AZOPT) 1 % ophthalmic suspension Place 1 drop into the right eye 2 (two) times daily. 04/25/15  Yes Historical Provider, MD  Canagliflozin (INVOKANA) 100 MG TABS Take 1 tablet by mouth daily. 01/21/14  Yes Historical Provider, MD  ciprofloxacin (CIPRO) 500 MG tablet Take 500 mg by mouth 2 (two) times daily. 12/23/15  Yes Historical Provider, MD  COMBIGAN 0.2-0.5 % ophthalmic solution Place 1 drop into both eyes 2 (two) times  daily.  04/12/15  Yes Historical Provider, MD  gabapentin (NEURONTIN) 300 MG capsule Take 600 mg by mouth 2 (two) times daily.    Yes Historical Provider, MD  insulin glargine (LANTUS) 100 UNIT/ML injection Inject 65 Units into the skin every morning.   Yes Historical Provider, MD  insulin lispro (HUMALOG) 100 UNIT/ML injection Inject 20-40 Units into the skin 3 (three) times daily. Per sliding scale   Yes Historical Provider, MD  Lancets (ONETOUCH ULTRASOFT) lancets Use 1 lancets three (3) times a day with insulin 09/16/14  Yes Historical Provider, MD  Liraglutide (VICTOZA) 18 MG/3ML SOPN Inject 1.8 mg as directed daily.  01/03/14  Yes Historical Provider, MD  LUMIGAN 0.01 % SOLN Place 1 drop into both eyes at bedtime. 09/16/14  Yes Historical Provider, MD  metoprolol succinate (TOPROL-XL) 100 MG 24 hr tablet TAKE 1 TABLET BY MOUTH ONCE DAILY 03/31/14  Yes Historical Provider, MD  metroNIDAZOLE (FLAGYL) 500 MG tablet Take 500 mg by mouth 2 (two) times daily. 12/23/15  Yes Historical Provider, MD  mirabegron ER (MYRBETRIQ) 25 MG TB24 tablet Take 25 mg by mouth daily.   Yes Historical Provider, MD  Multiple Vitamin (MULTIVITAMIN) tablet Take 1 tablet by mouth daily. Reported on 12/31/2015   Yes Historical Provider, MD  simvastatin (ZOCOR) 20 MG tablet TAKE 1 TABLET BY MOUTH EVERY DAY AT BEDTIME 05/02/14  Yes Historical Provider, MD  spironolactone (ALDACTONE) 25 MG tablet TAKE 1 TABLET BY MOUTH EVERY DAY 09/04/14  Yes Historical Provider, MD  torsemide (DEMADEX) 20 MG tablet TAKE 1 TABLET EVERY DAY 06/04/14  Yes Historical Provider, MD  amLODipine-benazepril (LOTREL) 10-20 MG capsule Take 1 capsule by mouth daily. Reported on 12/31/2015 08/25/15   Historical Provider, MD  methocarbamol (ROBAXIN) 500 MG tablet Take 1 tablet (500 mg total) by mouth every 6 (six) hours as needed for muscle spasms. Patient not taking: Reported on 12/31/2015 11/10/15   [name], PA-C  oxyCODONE (OXY IR/ROXICODONE) 5 MG immediate release  tablet Take 1-2 tablets (5-10 mg total) by mouth every 3 (three) hours as needed for moderate pain or severe pain. Patient not taking: Reported on 12/31/2015 11/10/15   [name], PA-C  rivaroxaban (XARELTO) 10 MG TABS tablet Take 1 tablet (10 mg total) by mouth daily with breakfast. Take Xarelto for two and a half more weeks, then discontinue Xarelto. Once the patient has completed the blood thinner regimen, then take a Baby 81 mg Aspirin daily for three more weeks. Patient not taking: Reported on 12/31/2015 11/10/15   [name], PA-C  traMADol (ULTRAM) 50 MG tablet Take 1-2 tablets (50-100 mg total) by mouth every 6 (six) hours as needed (mild pain). Patient not taking: Reported on 12/31/2015 11/10/15   [name], PA-C    Physical Exam: Filed Vitals:   12/31/15 2151 12/31/15 2152 12/31/15 2245  12/31/15 2312  BP:  143/79 133/63 150/78  Pulse:  102 104 105  Temp:      TempSrc:      Resp:  16 28 25   Height:      Weight:      SpO2: 86% 100% 98% 97%      Constitutional: NAD, calm, comfortable Filed Vitals:   12/31/15 2151 12/31/15 2152 12/31/15 2245 12/31/15 2312  BP:  143/79 133/63 150/78  Pulse:  102 104 105  Temp:      TempSrc:      Resp:  16 28 25   Height:      Weight:      SpO2: 86% 100% 98% 97%   Eyes: PERRL, lids and conjunctivae normal ENMT: Mucous membranes are moist. Posterior pharynx clear of any exudate or lesions.Normal dentition.  Neck: normal, supple, no masses, no thyromegaly Respiratory: clear to auscultation bilaterally, no wheezing, no crackles. Normal respiratory effort. No accessory muscle use.  Cardiovascular: Tachycardic at 104 BPM, positive 3/6 systolic murmurs, no rubs / gallops.                             No extremity edema. 2+ pedal pulses. No carotid bruits.  Abdomen: LUQ and mild RLQ tenderness, no guarding, no rebound, no masses palpated.                  No hepatosplenomegaly. Bowel sounds positive.  Musculoskeletal: no clubbing / cyanosis. No  joint deformity upper and lower extremities.                             Good ROM, no contractures. Normal muscle tone.  Skin: no rashes, lesions, ulcers. No induration Neurologic: CN 2-12 grossly intact. Sensation intact, DTR normal. Strength 5/5 in all 4.  Psychiatric: Normal judgment and insight. Alert and oriented x 4. Normal mood.    Labs on Admission: I have personally reviewed following labs and imaging studies  CBC:  Recent Labs Lab 12/31/15 1500  WBC 15.4*  HGB 13.0  HCT 41.4  MCV 78.9  PLT [id number]   Basic Metabolic Panel:  Recent Labs Lab 12/31/15 1500  NA 138  K 4.2  CL 102  CO2 25  GLUCOSE 175*  BUN 21*  CREATININE 1.66*  CALCIUM 9.4   GFR: Estimated Creatinine Clearance: 44 mL/min (by C-G formula based on Cr of 1.66). Liver Function Tests:  Recent Labs Lab 12/31/15 1500  AST 23  ALT 17  ALKPHOS 109  BILITOT 0.6  PROT 7.5  ALBUMIN 3.6    Recent Labs Lab 12/31/15 1500  LIPASE 19   CBG:  Recent Labs Lab 12/31/15 1442  GLUCAP 182*   Urine analysis:    Component Value Date/Time   COLORURINE YELLOW 12/31/2015 1533   APPEARANCEUR CLEAR 12/31/2015 1533   LABSPEC 1.022 12/31/2015 1533   PHURINE 7.0 12/31/2015 1533   GLUCOSEU >1000* 12/31/2015 1533   HGBUR NEGATIVE 12/31/2015 1533   [place] 12/31/2015 1533   KETONESUR 15* 12/31/2015 1533   PROTEINUR NEGATIVE 12/31/2015 1533   NITRITE NEGATIVE 12/31/2015 1533   LEUKOCYTESUR TRACE* 12/31/2015 1533    Radiological Exams on Admission: Ct Abdomen Pelvis W Contrast  12/31/2015  CLINICAL DATA:  Left upper quadrant abdominal pain with nausea vomiting and chronic constipation. EXAM: CT ABDOMEN AND PELVIS WITH CONTRAST TECHNIQUE: Multidetector CT imaging of the abdomen and pelvis was performed using the  standard protocol following bolus administration of intravenous contrast. CONTRAST:  40mL ISOVUE-300 IOPAMIDOL (ISOVUE-300) INJECTION 61% COMPARISON:  None. FINDINGS: Lower chest:  No  acute findings.  Small hiatal hernia. Hepatobiliary: No masses or other significant abnormality. Pancreas: No mass, inflammatory changes, or other significant abnormality. Spleen: Within normal limits in size and appearance. Adrenals/Urinary Tract: No masses identified. No evidence of hydronephrosis. There is bilateral mild perirenal fat stranding. There is enlargement of bilateral adrenal glands. The left adrenal gland is noted to contain a tiny calcification inferiorly. Stomach/Bowel: No evidence of obstruction, inflammatory process, or abnormal fluid collections. The retrocecal appendix is normal. Vascular/Lymphatic: No pathologically enlarged lymph nodes. No evidence of abdominal aortic aneurysm. Moderate calcified atherosclerosis of the distal aorta and bilateral iliac arteries. There are shotty mesenteric lymph nodes. Reproductive: No mass or other significant abnormality. Other: None. Musculoskeletal:  No suspicious bone lesions identified. IMPRESSION: No acute findings within the abdomen or pelvis. Enlargement of bilateral adrenal glands with punctate calcification within the left adrenal gland. This may represent benign adrenal hypertrophy, however a true adrenal masses cannot be excluded. Please correlate to patient's clinical history. If no prior imaging of the adrenal glands is available, CT or MRI of the abdomen adrenal protocol is recommended. Mild bilateral perirenal fat stranding, nonspecific. Electronically Signed   By: Fidela Salisbury M.D.   On: 12/31/2015 18:03    EKG: Independently reviewed. Vent. rate 102 BPM PR interval 197 ms QRS duration 85 ms QT/QTc 349/455 ms P-R-T axes 49 53 3 Sinus tachycardia Low voltage, extremity and precordial leads No significant change since previous, except for faster heart rate.  Assessment/Plan Principal Problem:   Intractable nausea and vomiting   Diabetic gastroparesis ([place])   Sepsis ([place]) Admit to telemetry/observation. Continue IV  fluids. Continue ciprofloxacin and metronidazole IV. Continue analgesics as needed. Continue antiemetics as needed.  Active Problems:   Diabetes mellitus ([facility]) CBG monitoring with regular insulin sliding scale. Resume carbohydrate modified diet and Lantus insulin once tolerating by mouth.    OSA (obstructive sleep apnea) Continue CPAP at bedtime per home settings.    Essential hypertension Resume antihypertensives once tolerating oral intake.    Glaucoma Continue Lumigan, Azopt and Combigan drops per  prescription.    Diabetic peripheral neuropathy ([facility]) Resume gabapentin once tolerating oral intake.    Hyperlipidemia Continue simvastatin once tolerating oral intake.      Left adrenal gland calcification incidentally found on CT scan. This will need dedicated imaging in the future as recommended by radiology.       DVT prophylaxis: SCDs. Code Status: Full code. Family Communication: His wife is present in the room. Disposition Plan: Admit for IV hydration and symptoms control for 24-48 hours. Consults called:  Admission status: Observation/telemetry   Reubin Milan MD [facility] Pager [phone]  If 7PM-7AM, please contact night-coverage [email or web address] Password [facility]  12/31/2015, 11:36 PM

## 2015-12-31 NOTE — Discharge Instructions (Signed)
Enlargement of bilateral adrenal glands with punctate calcification within the left adrenal gland. This may represent benign adrenal hypertrophy, however a true adrenal masses cannot be excluded. Please correlate to patient's clinical history. If no prior imaging of the adrenal glands is available, CT or MRI of the abdomen adrenal protocol is recommended.

## 2015-12-31 NOTE — Progress Notes (Signed)
Received report from [place], RN in ED for transfer of pt to [phone]

## 2015-12-31 NOTE — ED Notes (Signed)
Pt states he has been having mid abd pain for 1 week and vomiting that started this morning. Pt is currently on antibiotics 1 week ago for possible diverticulitis. Pt is cool and clammy in triage.

## 2015-12-31 NOTE — ED Notes (Signed)
Attempted to call report

## 2016-01-01 ENCOUNTER — Encounter (HOSPITAL_COMMUNITY): Payer: Self-pay | Admitting: General Practice

## 2016-01-01 ENCOUNTER — Observation Stay (HOSPITAL_COMMUNITY): Payer: Medicare Other

## 2016-01-01 DIAGNOSIS — E875 Hyperkalemia: Secondary | ICD-10-CM

## 2016-01-01 DIAGNOSIS — Z794 Long term (current) use of insulin: Secondary | ICD-10-CM

## 2016-01-01 DIAGNOSIS — K3184 Gastroparesis: Secondary | ICD-10-CM

## 2016-01-01 DIAGNOSIS — E1142 Type 2 diabetes mellitus with diabetic polyneuropathy: Secondary | ICD-10-CM | POA: Diagnosis not present

## 2016-01-01 DIAGNOSIS — E1143 Type 2 diabetes mellitus with diabetic autonomic (poly)neuropathy: Secondary | ICD-10-CM

## 2016-01-01 DIAGNOSIS — E0865 Diabetes mellitus due to underlying condition with hyperglycemia: Secondary | ICD-10-CM

## 2016-01-01 DIAGNOSIS — R1110 Vomiting, unspecified: Secondary | ICD-10-CM | POA: Diagnosis not present

## 2016-01-01 DIAGNOSIS — N183 Chronic kidney disease, stage 3 (moderate): Secondary | ICD-10-CM

## 2016-01-01 DIAGNOSIS — D3500 Benign neoplasm of unspecified adrenal gland: Secondary | ICD-10-CM | POA: Diagnosis not present

## 2016-01-01 DIAGNOSIS — E1165 Type 2 diabetes mellitus with hyperglycemia: Secondary | ICD-10-CM | POA: Diagnosis not present

## 2016-01-01 DIAGNOSIS — E1122 Type 2 diabetes mellitus with diabetic chronic kidney disease: Secondary | ICD-10-CM

## 2016-01-01 DIAGNOSIS — I10 Essential (primary) hypertension: Secondary | ICD-10-CM | POA: Diagnosis not present

## 2016-01-01 LAB — COMPREHENSIVE METABOLIC PANEL
ALK PHOS: 102 U/L (ref 38–126)
ALT: 18 U/L (ref 17–63)
ANION GAP: 15 (ref 5–15)
AST: 21 U/L (ref 15–41)
Albumin: 3.4 g/dL — ABNORMAL LOW (ref 3.5–5.0)
BILIRUBIN TOTAL: 0.7 mg/dL (ref 0.3–1.2)
BUN: 27 mg/dL — AB (ref 6–20)
CALCIUM: 9.1 mg/dL (ref 8.9–10.3)
CO2: 20 mmol/L — ABNORMAL LOW (ref 22–32)
Chloride: 101 mmol/L (ref 101–111)
Creatinine, Ser: 1.64 mg/dL — ABNORMAL HIGH (ref 0.61–1.24)
GFR calc Af Amer: 48 mL/min — ABNORMAL LOW (ref >60)
GFR, EST NON AFRICAN AMERICAN: 41 mL/min — AB (ref >60)
GLUCOSE: 393 mg/dL — AB (ref 65–99)
Potassium: 5.4 mmol/L — ABNORMAL HIGH (ref 3.5–5.1)
Sodium: 136 mmol/L (ref 135–145)
TOTAL PROTEIN: 7.2 g/dL (ref 6.5–8.1)

## 2016-01-01 LAB — CBC WITH DIFFERENTIAL/PLATELET
BASOS PCT: 0 %
Basophils Absolute: 0 10*3/uL (ref 0.0–0.1)
Eosinophils Absolute: 0 10*3/uL (ref 0.0–0.7)
Eosinophils Relative: 0 %
HEMATOCRIT: 40.5 % (ref 39.0–52.0)
HEMOGLOBIN: 12.4 g/dL — AB (ref 13.0–17.0)
LYMPHS PCT: 5 %
Lymphs Abs: 0.7 10*3/uL (ref 0.7–4.0)
MCH: 24.5 pg — ABNORMAL LOW (ref 26.0–34.0)
MCHC: 30.6 g/dL (ref 30.0–36.0)
MCV: 79.9 fL (ref 78.0–100.0)
MONOS PCT: 5 %
Monocytes Absolute: 0.8 10*3/uL (ref 0.1–1.0)
NEUTROS ABS: 14.1 10*3/uL — AB (ref 1.7–7.7)
NEUTROS PCT: 90 %
Platelets: 355 10*3/uL (ref 150–400)
RBC: 5.07 MIL/uL (ref 4.22–5.81)
RDW: 14.9 % (ref 11.5–15.5)
WBC: 15.6 10*3/uL — ABNORMAL HIGH (ref 4.0–10.5)

## 2016-01-01 LAB — CORTISOL: CORTISOL PLASMA: 43.3 ug/dL

## 2016-01-01 LAB — GLUCOSE, CAPILLARY
GLUCOSE-CAPILLARY: 330 mg/dL — AB (ref 65–99)
GLUCOSE-CAPILLARY: 401 mg/dL — AB (ref 65–99)
Glucose-Capillary: 445 mg/dL — ABNORMAL HIGH (ref 65–99)
Glucose-Capillary: 135 mg/dL — ABNORMAL HIGH (ref 65–99)
Glucose-Capillary: 118 mg/dL — ABNORMAL HIGH (ref 65–99)

## 2016-01-01 MED ORDER — INSULIN ASPART 100 UNIT/ML ~~LOC~~ SOLN
20.0000 [IU] | Freq: Three times a day (TID) | SUBCUTANEOUS | Status: DC
  Administered 2016-01-02: 20 [IU] via SUBCUTANEOUS

## 2016-01-01 MED ORDER — BRIMONIDINE TARTRATE-TIMOLOL 0.2-0.5 % OP SOLN: 1.0000 [drp] | Freq: Two times a day (BID) | OPHTHALMIC | Status: DC

## 2016-01-01 MED ORDER — BRIMONIDINE TARTRATE 0.2 % OP SOLN
1.0000 [drp] | Freq: Two times a day (BID) | OPHTHALMIC | Status: DC
  Administered 2016-01-01 – 2016-01-02 (×3): 1 [drp] via OPHTHALMIC
  Filled 2016-01-01 (×2): qty 5

## 2016-01-01 MED ORDER — METOPROLOL SUCCINATE ER 100 MG PO TB24
100.0000 mg | ORAL_TABLET | Freq: Every day | ORAL | Status: DC
  Administered 2016-01-01 – 2016-01-02 (×2): 100 mg via ORAL
  Filled 2016-01-01 (×2): qty 1

## 2016-01-01 MED ORDER — LATANOPROST 0.005 % OP SOLN
1.0000 [drp] | Freq: Every day | OPHTHALMIC | Status: DC
  Administered 2016-01-02: 1 [drp] via OPHTHALMIC
  Filled 2016-01-01: qty 2.5

## 2016-01-01 MED ORDER — BRINZOLAMIDE 1 % OP SUSP
1.0000 [drp] | Freq: Two times a day (BID) | OPHTHALMIC | Status: DC
  Administered 2016-01-02 (×2): 1 [drp] via OPHTHALMIC
  Filled 2016-01-01: qty 10

## 2016-01-01 MED ORDER — TIMOLOL MALEATE 0.5 % OP SOLN
1.0000 [drp] | Freq: Two times a day (BID) | OPHTHALMIC | Status: DC
  Administered 2016-01-01 – 2016-01-02 (×3): 1 [drp] via OPHTHALMIC
  Filled 2016-01-01 (×2): qty 5

## 2016-01-01 MED ORDER — GABAPENTIN 600 MG PO TABS
600.0000 mg | ORAL_TABLET | Freq: Two times a day (BID) | ORAL | Status: DC
  Administered 2016-01-01 – 2016-01-02 (×3): 600 mg via ORAL
  Filled 2016-01-01 (×3): qty 1

## 2016-01-01 MED ORDER — GABAPENTIN 300 MG PO CAPS: 600.0000 mg | ORAL_CAPSULE | Freq: Two times a day (BID) | ORAL | Status: DC

## 2016-01-01 MED ORDER — ADULT MULTIVITAMIN W/MINERALS CH
1.0000 | ORAL_TABLET | Freq: Every day | ORAL | Status: DC
  Administered 2016-01-01 – 2016-01-02 (×2): 1 via ORAL
  Filled 2016-01-01 (×3): qty 1

## 2016-01-01 MED ORDER — CETYLPYRIDINIUM CHLORIDE 0.05 % MT LIQD
7.0000 mL | Freq: Two times a day (BID) | OROMUCOSAL | Status: DC
  Administered 2016-01-01 – 2016-01-02 (×3): 7 mL via OROMUCOSAL

## 2016-01-01 MED ORDER — INSULIN GLARGINE 100 UNIT/ML ~~LOC~~ SOLN
65.0000 [IU] | Freq: Every morning | SUBCUTANEOUS | Status: DC
  Administered 2016-01-01 – 2016-01-02 (×2): 65 [IU] via SUBCUTANEOUS
  Filled 2016-01-01 (×2): qty 0.65

## 2016-01-01 MED ORDER — MIRABEGRON ER 25 MG PO TB24
25.0000 mg | ORAL_TABLET | Freq: Every day | ORAL | Status: DC
  Administered 2016-01-01 – 2016-01-02 (×2): 25 mg via ORAL
  Filled 2016-01-01 (×2): qty 1

## 2016-01-01 MED ORDER — SIMVASTATIN 20 MG PO TABS
20.0000 mg | ORAL_TABLET | Freq: Every day | ORAL | Status: DC
  Administered 2016-01-01: 20 mg via ORAL
  Filled 2016-01-01: qty 1

## 2016-01-01 MED ORDER — INSULIN ASPART 100 UNIT/ML ~~LOC~~ SOLN: 15.0000 [IU] | Freq: Three times a day (TID) | SUBCUTANEOUS | Status: DC

## 2016-01-01 NOTE — Care Management Note (Signed)
Case Management Note  Patient Details  Name: Micheal Morris MRN: [id number] Date of Birth: [birth year]  Subjective/Objective:                 Spoke with patient in the room. He is obs for N/V. He states that he lives at home with his wife, and is independent, still drives. He is insured and has PCP. He had a knee replacement 11-09-15 he gets outpatient PT.    Action/Plan:  Patient denied any CM needs at this time, will continue to follow.   Expected Discharge Date:                  Expected Discharge Plan:  Home/Self Care  In-House Referral:     Discharge planning Services  CM Consult  Post Acute Care Choice:  NA Choice offered to:     DME Arranged:    DME Agency:     HH Arranged:    HH Agency:     Status of Service:  In process, will continue to follow  Medicare Important Message Given:    Date Medicare IM Given:    Medicare IM give by:    Date Additional Medicare IM Given:    Additional Medicare Important Message give by:     If discussed at [place] of Stay Meetings, dates discussed:    Additional Comments:  Carles Collet, RN 01/01/2016, 11:46 AM

## 2016-01-01 NOTE — Progress Notes (Signed)
PROGRESS NOTE                                                                                                                                                                                                             Patient Demographics:    Micheal Morris, is a 70 y.o. male, DOB - [birth year], [id number]  Admit date - 12/31/2015   Admitting Physician Reubin Milan, MD  Outpatient Primary MD for the patient is Chesley Noon, MD  LOS -   Outpatient Specialists: Dr Buddy Duty ( Endocrinologist)  Chief Complaint  Patient presents with  . Abdominal Pain       Brief Narrative   70 year old male with history of type 2 diabetes mellitus on insulin with peripheral neuropathy (A1c of 7.7), CKDStage III, restrictive lung disease, OSA, who presented to the ED with about 2 weeks of abdominal pain. On the day of admission he had several episodes of nausea and vomiting. He saw his PCP about a week back for his abdominal pain which was crampy in nature without associated diarrhea or hematochezia. He was prescribed a course of ciprofloxacin and Flagyl for suspected diverticulitis. It improved slightly but then reoccurred with colicky pain. Reports feeling very weak with poor appetite. Denies recent illness or travel. Denies change in medications. He denied fevers, chills, headache, blurred vision, dizziness, cough, shortness of breath, chest pain or palpitations.  In the ED he was tachycardic and tachypneic and had leukocytosis meeting criteria for SIRS. Blood work showed WBC of 15.4 K, chemistry showed BUN of 21 creatinine 1.66.  LFTs and lipase were normal however UA was suggestive of UTI. Admitted to hospitalist service for further management. CT of the abdomen negative for any obstruction or bowel inflammation however showed enlarged bilateral adrenal glands with punctate calcification in the left adrenal gland.     Subjective:     Patient denies further nausea or vomiting. Denies abdominal pain this morning. CBG was elevated >400   Assessment  & Plan :    Principal Problem:  SIRS with Intractable nausea , vomiting and abdominal pain Differential includes viral gastritis versus diabetic gastroparesis. Symptoms much better this morning. Start on clear liquid diet and advance as tolerated. Supportive care with IV fluids And scheduled Reglan. On empiric cipro and flagyl.   Active Problems: uncontrolled type 2 diabetes mellitus  Patient on Lantus, pre-meal aspart and Victoza at home. CBG in 400s. Check A1c. Resume lantus and pre-meal aspart with sliding insulin coverage.   UTI Continue empiric Cipro. Check culture.  Abnormal enlargement of adrenal gland As seen on CT abdomen. Check MRI abdomen with adrenal protocol. Cortisol level normal.  CK D stage III Sees Dr Joelyn Oms. Baseline creatinine around 1.5-1.6.   OSA (obstructive sleep apnea)   Essential hypertension     Diabetic peripheral neuropathy (HCC) Continue Neurontin.  Hyperlipidemia Continue statin  Hyperkalemia Recheck labs in morning.     Code Status : Full code  Family Communication  : None at bedside  Disposition Plan  : Home once symptoms improve possibly in the next 24-48 hours  Barriers For Discharge : Ongoing symptoms, hyperglycemia, pending MRI  Consults  :  None  Procedures  : CT abdomen, MRI abdomen  DVT Prophylaxis  :  Lovenox -   Lab Results  Component Value Date   PLT 355 01/01/2016    Antibiotics  :   Anti-infectives    Start     Dose/Rate Route Frequency Ordered Stop   01/01/16 0000  ciprofloxacin (CIPRO) IVPB 400 mg     400 mg 200 mL/hr over 60 Minutes Intravenous 2 times daily 12/31/15 2330     12/31/15 2345  metroNIDAZOLE (FLAGYL) IVPB 500 mg     500 mg 100 mL/hr over 60 Minutes Intravenous Every 8 hours 12/31/15 2330          Objective:   Filed Vitals:   12/31/15 2359 01/01/16 0518 01/01/16  1101 01/01/16 1201  BP:  155/68 108/62 100/61  Pulse:  105 106 105  Temp:  98.3 F (36.8 C)  98.3 F (36.8 C)  TempSrc:  Oral  Oral  Resp:  18  18  Height: 5\' 10"  (1.778 m)     Weight: 112.9 kg (248 lb 14.4 oz)     SpO2:  96%  100%    Wt Readings from Last 3 Encounters:  12/31/15 112.9 kg (248 lb 14.4 oz)  11/09/15 123.832 kg (273 lb)  11/03/15 123.832 kg (273 lb)     Intake/Output Summary (Last 24 hours) at 01/01/16 1328 Last data filed at 01/01/16 0952  Gross per 24 hour  Intake    403 ml  Output   1450 ml  Net  -1047 ml     Physical Exam  Gen: not in distress HEENT: no pallor, Dry mucosa, supple neck Chest: clear b/l, no added sounds CVS: N S1&S2, no murmurs, rubs or gallop GI: soft, NT, ND, BS+ Musculoskeletal: warm, no edema CNS: AAOX3, non focal    Data Review:    CBC  Recent Labs Lab 12/31/15 1500 12/31/15 2323 01/01/16 0520  WBC 15.4*  --  15.6*  HGB 13.0  --  12.4*  HCT 41.4  --  40.5  PLT 366  --  355  MCV 78.9  --  79.9  MCH 24.8*  --  24.5*  MCHC 31.4  --  30.6  RDW 14.6  --  14.9  LYMPHSABS  --  0.7 0.7  MONOABS  --  0.3 0.8  EOSABS  --  0.0 0.0  BASOSABS  --  0.0 0.0    Chemistries   Recent Labs Lab 12/31/15 1500 01/01/16 0520  NA 138 136  K 4.2 5.4*  CL 102 101  CO2 25 20*  GLUCOSE 175* 393*  BUN 21* 27*  CREATININE 1.66* 1.64*  CALCIUM 9.4 9.1  AST  23 21  ALT 17 18  ALKPHOS 109 102  BILITOT 0.6 0.7   ------------------------------------------------------------------------------------------------------------------ No results for input(s): CHOL, HDL, LDLCALC, TRIG, CHOLHDL, LDLDIRECT in the last 72 hours.  Lab Results  Component Value Date   HGBA1C 7.7* 09/14/2015   ------------------------------------------------------------------------------------------------------------------ No results for input(s): TSH, T4TOTAL, T3FREE, THYROIDAB in the last 72 hours.  Invalid input(s):  FREET3 ------------------------------------------------------------------------------------------------------------------ No results for input(s): VITAMINB12, FOLATE, FERRITIN, TIBC, IRON, RETICCTPCT in the last 72 hours.  Coagulation profile No results for input(s): INR, PROTIME in the last 168 hours.  No results for input(s): DDIMER in the last 72 hours.  Cardiac Enzymes No results for input(s): CKMB, TROPONINI, MYOGLOBIN in the last 168 hours.  Invalid input(s): CK ------------------------------------------------------------------------------------------------------------------ No results found for: BNP  Inpatient Medications  Scheduled Meds: . antiseptic oral rinse  7 mL Mouth Rinse BID  . brimonidine  1 drop Both Eyes BID   And  . timolol  1 drop Both Eyes BID  . ciprofloxacin  400 mg Intravenous BID  . gabapentin  600 mg Oral BID  . insulin aspart  0-15 Units Subcutaneous TID WC  . insulin aspart  15 Units Subcutaneous TID WC  . insulin glargine  65 Units Subcutaneous q morning - 10a  . metoCLOPramide (REGLAN) injection  10 mg Intravenous Q6H  . metoprolol succinate  100 mg Oral Daily  . metronidazole  500 mg Intravenous Q8H  . mirabegron ER  25 mg Oral Daily  . multivitamin with minerals  1 tablet Oral Daily  . simvastatin  20 mg Oral q1800  . sodium chloride flush  3 mL Intravenous Q12H   Continuous Infusions: . sodium chloride 125 mL/hr at 12/31/15 2350   PRN Meds:.HYDROmorphone (DILAUDID) injection, ondansetron  Micro Results No results found for this or any previous visit (from the past 240 hour(s)).  Radiology Reports Ct Abdomen Pelvis W Contrast  12/31/2015  CLINICAL DATA:  Left upper quadrant abdominal pain with nausea vomiting and chronic constipation. EXAM: CT ABDOMEN AND PELVIS WITH CONTRAST TECHNIQUE: Multidetector CT imaging of the abdomen and pelvis was performed using the standard protocol following bolus administration of intravenous contrast.  CONTRAST:  57mL ISOVUE-300 IOPAMIDOL (ISOVUE-300) INJECTION 61% COMPARISON:  None. FINDINGS: Lower chest:  No acute findings.  Small hiatal hernia. Hepatobiliary: No masses or other significant abnormality. Pancreas: No mass, inflammatory changes, or other significant abnormality. Spleen: Within normal limits in size and appearance. Adrenals/Urinary Tract: No masses identified. No evidence of hydronephrosis. There is bilateral mild perirenal fat stranding. There is enlargement of bilateral adrenal glands. The left adrenal gland is noted to contain a tiny calcification inferiorly. Stomach/Bowel: No evidence of obstruction, inflammatory process, or abnormal fluid collections. The retrocecal appendix is normal. Vascular/Lymphatic: No pathologically enlarged lymph nodes. No evidence of abdominal aortic aneurysm. Moderate calcified atherosclerosis of the distal aorta and bilateral iliac arteries. There are shotty mesenteric lymph nodes. Reproductive: No mass or other significant abnormality. Other: None. Musculoskeletal:  No suspicious bone lesions identified. IMPRESSION: No acute findings within the abdomen or pelvis. Enlargement of bilateral adrenal glands with punctate calcification within the left adrenal gland. This may represent benign adrenal hypertrophy, however a true adrenal masses cannot be excluded. Please correlate to patient's clinical history. If no prior imaging of the adrenal glands is available, CT or MRI of the abdomen adrenal protocol is recommended. Mild bilateral perirenal fat stranding, nonspecific. Electronically Signed   By: Fidela Salisbury M.D.   On: 12/31/2015 18:03    Time Spent in  minutes  25   Louellen Molder M.D on 01/01/2016 at 1:28 PM  Between 7am to 7pm - Pager - [phone]  After 7pm go to [email or web address] - password [facility]  [facility] -  Office  [phone]

## 2016-01-01 NOTE — Progress Notes (Signed)
CBG 401 this am. Paged Dr Clementeen Graham. Waiting to hear back from dr. Aletha Halim administer sliding scale 15 units now. Pt is A&O. Will continue to monitor pt.

## 2016-01-01 NOTE — Care Management Obs Status (Signed)
[facility] NOTIFICATION   Patient Details  Name: BAZIL COLLINSON MRN: [id number] Date of Birth: Jan 13, 1946   Medicare Observation Status Notification Given:  Yes    Carles Collet, RN 01/01/2016, 11:45 AM

## 2016-01-01 NOTE — Progress Notes (Signed)
NURSING PROGRESS NOTE  Micheal Morris [id number] Admission Data: 01/01/2016 12:49 AM Attending Provider: Reubin Milan, MD [id number] C, MD Code Status: full  Allergies:  Nsaids and Aspirin Past Medical History:   has a past medical history of Erectile dysfunction; HTN (hypertension); Hyperlipidemia; Type II diabetes mellitus ([place]); Chronic kidney disease; Sensory hearing loss, bilateral; Restrictive lung disease; Obesity; Pain of left upper arm; Arthritis; OSA (obstructive sleep apnea); SOBOE (shortness of breath on exertion) (11/03/15); and Neuromuscular disorder ([place]). 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 Social History:   reports that he quit smoking about 41 years ago. His smoking use included Cigarettes. He has a 7 pack-year smoking history. He has never used smokeless tobacco. He reports that he does not drink alcohol or use illicit drugs.  Micheal Morris is a 70 y.o. male patient admitted from ED:   Last Documented Vital Signs: Blood pressure 151/73, pulse 103, temperature 97.9 F (36.6 C), temperature source Oral, resp. rate 17, height 5\' 10"  (1.778 m), weight 112.9 kg (248 lb 14.4 oz), SpO2 100 %.  Cardiac Monitoring: Box # 02 in place. Cardiac monitor yields:normal sinus rhythm.  IV Fluids:  IV in place, occlusive dsg intact without redness, IV cath antecubital right, condition patent and no redness normal saline.   Skin: Dry, Intact  Patient/Family orientated to room. Information packet given to patient/family. Admission inpatient armband information verified with patient/family to include name and date of birth and placed on patient arm. Side rails up x 2, fall assessment and education completed with patient/family. Patient/family able to verbalize understanding of risk associated with falls and verbalized understanding to call for assistance before getting out  of bed. Call light within reach. Patient/family able to voice and demonstrate understanding of unit orientation instructions.    Will continue to evaluate and treat per MD orders.   Amaryllis Dyke, RN

## 2016-01-02 DIAGNOSIS — E1165 Type 2 diabetes mellitus with hyperglycemia: Secondary | ICD-10-CM | POA: Diagnosis not present

## 2016-01-02 DIAGNOSIS — I10 Essential (primary) hypertension: Secondary | ICD-10-CM

## 2016-01-02 DIAGNOSIS — N183 Chronic kidney disease, stage 3 unspecified: Secondary | ICD-10-CM | POA: Diagnosis present

## 2016-01-02 DIAGNOSIS — Z794 Long term (current) use of insulin: Secondary | ICD-10-CM

## 2016-01-02 DIAGNOSIS — D3500 Benign neoplasm of unspecified adrenal gland: Secondary | ICD-10-CM | POA: Diagnosis not present

## 2016-01-02 DIAGNOSIS — R6510 Systemic inflammatory response syndrome (SIRS) of non-infectious origin without acute organ dysfunction: Secondary | ICD-10-CM | POA: Diagnosis present

## 2016-01-02 DIAGNOSIS — N390 Urinary tract infection, site not specified: Secondary | ICD-10-CM | POA: Insufficient documentation

## 2016-01-02 DIAGNOSIS — R1110 Vomiting, unspecified: Secondary | ICD-10-CM | POA: Diagnosis not present

## 2016-01-02 LAB — CBC
HCT: 35.8 % — ABNORMAL LOW (ref 39.0–52.0)
HEMOGLOBIN: 10.9 g/dL — AB (ref 13.0–17.0)
MCH: 24.3 pg — AB (ref 26.0–34.0)
MCHC: 30.4 g/dL (ref 30.0–36.0)
MCV: 79.9 fL (ref 78.0–100.0)
Platelets: 339 10*3/uL (ref 150–400)
RBC: 4.48 MIL/uL (ref 4.22–5.81)
RDW: 14.9 % (ref 11.5–15.5)
WBC: 14.1 10*3/uL — ABNORMAL HIGH (ref 4.0–10.5)

## 2016-01-02 LAB — BASIC METABOLIC PANEL
ANION GAP: 6 (ref 5–15)
BUN: 24 mg/dL — AB (ref 6–20)
CHLORIDE: 108 mmol/L (ref 101–111)
CO2: 27 mmol/L (ref 22–32)
Calcium: 8.5 mg/dL — ABNORMAL LOW (ref 8.9–10.3)
Creatinine, Ser: 1.59 mg/dL — ABNORMAL HIGH (ref 0.61–1.24)
GFR calc Af Amer: 50 mL/min — ABNORMAL LOW (ref >60)
GFR calc non Af Amer: 43 mL/min — ABNORMAL LOW (ref >60)
Glucose, Bld: 73 mg/dL (ref 65–99)
POTASSIUM: 3.6 mmol/L (ref 3.5–5.1)
SODIUM: 141 mmol/L (ref 135–145)

## 2016-01-02 LAB — GLUCOSE, CAPILLARY
GLUCOSE-CAPILLARY: 132 mg/dL — AB (ref 65–99)
Glucose-Capillary: 71 mg/dL (ref 65–99)
Glucose-Capillary: 117 mg/dL — ABNORMAL HIGH (ref 65–99)

## 2016-01-02 NOTE — Progress Notes (Signed)
Patient was discharged home by MD order; discharged instructions review and give to patient and his wife with care notes; IV DIC; skin intact; patient will be escorted to the car by nurse tech via wheelchair.  

## 2016-01-02 NOTE — Discharge Summary (Signed)
Physician Discharge Summary  Micheal Morris [id number] DOB: 09/13/1945 DOA: 12/31/2015  PCP: Chesley Noon, MD  Admit date: 12/31/2015 Discharge date: 01/02/2016  Time spent:25 minutes  Recommendations for Outpatient Follow-up:  Discharged home with outpatient PCP and endocrinology follow-up.    Discharge Diagnoses:  Principal Problem:   Intractable nausea and vomiting   Active Problems:   Diabetic gastroparesis (HCC)   Diabetes mellitus (HCC)   OSA (obstructive sleep apnea)   Essential hypertension   Glaucoma   Diabetic peripheral neuropathy (HCC)   Hyperlipidemia   Adrenal adenoma   Diabetes mellitus with hyperglycemia, with long-term current use of insulin (HCC)   CKD (chronic kidney disease) stage 3, GFR 30-59 ml/min   SIRS (systemic inflammatory response syndrome) ([place])   Discharge Condition: Fair  Diet recommendation: Diabetic  Filed Weights   12/31/15 1434 12/31/15 2359  Weight: 78.472 kg (173 lb) 112.9 kg (248 lb 14.4 oz)    History of present illness:  Please refer to admission H&P for details, in brief, 70 year old male with history of type 2 diabetes mellitus on insulin with peripheral neuropathy (A1c of 7.7), CKDStage III, restrictive lung disease, OSA, who presented to the ED with about 2 weeks of abdominal pain. On the day of admission he had several episodes of nausea and vomiting. He saw his PCP about a week back for his abdominal pain which was crampy in nature without associated diarrhea or hematochezia. He was prescribed a course of ciprofloxacin and Flagyl for suspected diverticulitis. It improved slightly but then reoccurred with colicky pain. Reports feeling very weak with poor appetite. Denies recent illness or travel. Denies change in medications. He denied fevers, chills, headache, blurred vision, dizziness, cough, shortness of breath, chest pain or palpitations.  In the ED he was tachycardic and tachypneic and had leukocytosis meeting  criteria for SIRS. Blood work showed WBC of 15.4 K, chemistry showed BUN of 21 creatinine 1.66. LFTs and lipase were normal however UA was suggestive of UTI. Admitted to hospitalist service for further management. CT of the abdomen negative for any obstruction or bowel inflammation however showed enlarged bilateral adrenal glands with punctate calcification in the left adrenal gland.  Hospital Course:    Principal Problem: SIRS with Intractable nausea , vomiting and abdominal pain Differential includes viral gastritis versus diabetic gastroparesis. UA also positive for UTI. -Improved with IV fluids, antiemetics. Advance diet and tolerating well. Patient on empiric Cipro and Flagyl as outpatient and can be continued to complete course (both for gastroenteritis and UTI). -Stable for discharge home with outpatient follow-up   Active Problems: uncontrolled type 2 diabetes mellitus  Patient on Lantus, pre-meal aspart and Victoza at home. CBG in 400s on 6/2. (He was nothing by mouth and had not received his home medications). Once medications resumed CBG has been normal.  UTI Continue empiric Cipro to complete antibiotic course. Culture was not sent.  Abnormal enlargement of adrenal gland As seen on CT abdomen. Cortisol level normal. MRI of the abdomen with abdominal protocol showed bilateral abdominal adenomas. Could be nonspecific. Follow-up with PCP and endocrinology.  CK D stage III Sees Dr Joelyn Oms. Baseline creatinine around 1.5-1.6.  OSA (obstructive sleep apnea)  Essential hypertension    Diabetic peripheral neuropathy (HCC) Continue Neurontin.  Hyperlipidemia Continue statin  Hyperkalemia resolved     Code Status : Full code  Family Communication : Wife at bedside  Disposition Plan : Home    Consults : None  Procedures : CT abdomen, MRI abdomen  Discharge Exam:  Filed Vitals:   01/01/16 2221 01/02/16 0506  BP: 128/62 123/63  Pulse: 92 81  Temp:  98.6 F (37 C) 98.1 F (36.7 C)  Resp: 17 16   Gen: not in distress HEENT: no pallor, Dry mucosa, supple neck Chest: clear b/l, no added sounds CVS: N S1&S2, no murmurs, rubs or gallop GI: soft, NT, ND, BS+ Musculoskeletal: warm, no edema CNS: AAOX3, non focal  Discharge Instructions    Current Discharge Medication List    CONTINUE these medications which have NOT CHANGED   Details  amLODipine-benazepril (LOTREL) 10-20 MG per capsule TAKE 1 CAPSULE BY MOUTH DAILY    aspirin 81 MG chewable tablet Chew 81 mg by mouth daily.         BD PEN NEEDLE NANO U/F 32G X 4 MM MISC Use as directed with insulin Refills: 4     brinzolamide (AZOPT) 1 % ophthalmic suspension Place 1 drop into the right eye 2 (two) times daily.    Canagliflozin (INVOKANA) 100 MG TABS Take 1 tablet by mouth daily.    ciprofloxacin (CIPRO) 500 MG tablet Take 500 mg by mouth 2 (two) times daily. Refills: 0    COMBIGAN 0.2-0.5 % ophthalmic solution Place 1 drop into both eyes 2 (two) times daily.     gabapentin (NEURONTIN) 300 MG capsule Take 600 mg by mouth 2 (two) times daily.     insulin glargine (LANTUS) 100 UNIT/ML injection Inject 65 Units into the skin every morning.    insulin lispro (HUMALOG) 100 UNIT/ML injection Inject 20-40 Units into the skin 3 (three) times daily. Per sliding scale    Lancets (ONETOUCH ULTRASOFT) lancets Use 1 lancets three (3) times a day with insulin Refills: 11    Liraglutide (VICTOZA) 18 MG/3ML SOPN Inject 1.8 mg as directed daily.     LUMIGAN 0.01 % SOLN Place 1 drop into both eyes at bedtime. Refills: 3    metoprolol succinate (TOPROL-XL) 100 MG 24 hr tablet TAKE 1 TABLET BY MOUTH ONCE DAILY    metroNIDAZOLE (FLAGYL) 500 MG tablet Take 500 mg by mouth 2 (two) times daily. Refills: 0    mirabegron ER (MYRBETRIQ) 25 MG TB24 tablet Take 25 mg by mouth daily.    Multiple Vitamin (MULTIVITAMIN) tablet Take 1 tablet by mouth daily. Reported on 12/31/2015     simvastatin (ZOCOR) 20 MG tablet TAKE 1 TABLET BY MOUTH EVERY DAY AT BEDTIME    spironolactone (ALDACTONE) 25 MG tablet TAKE 1 TABLET BY MOUTH EVERY DAY    torsemide (DEMADEX) 20 MG tablet TAKE 1 TABLET EVERY DAY        STOP taking these medications     methocarbamol (ROBAXIN) 500 MG tablet      oxyCODONE (OXY IR/ROXICODONE) 5 MG immediate release tablet      rivaroxaban (XARELTO) 10 MG TABS tablet      traMADol (ULTRAM) 50 MG tablet        Allergies  Allergen Reactions  . Nsaids Other (See Comments)    GI BLEED  . Aspirin Other (See Comments)    STOMACH ULCER  STOMACH ULCER    Follow-up Information    Follow up with BADGER,MICHAEL C, MD. Schedule an appointment as soon as possible for a visit in 1 week.   Specialty:  Family Medicine   Contact information:   [address] [place] [address] [phone]        The results of significant diagnostics from this hospitalization (including imaging, microbiology, ancillary and laboratory)  are listed below for reference.    Significant Diagnostic Studies: Mr Abdomen W Wo Contrast  01/01/2016  CLINICAL DATA:  Bilateral adrenal gland enlargement on CT. No history of malignancy. History of hypertension, diabetes and chronic renal disease. EXAM: MRI ABDOMEN WITHOUT AND WITH CONTRAST TECHNIQUE: Multiplanar multisequence MR imaging of the abdomen was performed both before and after the administration of intravenous contrast. CONTRAST:  20 ml MultiHance. COMPARISON:  Abdominal pelvic CT 12/31/2015. Report from prior CT 02/19/2003. FINDINGS: Lower chest:  Unremarkable. Hepatobiliary: The liver demonstrates heterogeneous steatosis with loss of signal on the gradient echo opposed phase images. No suspicious hepatic findings. No evidence of gallstones, gallbladder wall thickening or biliary dilatation. Pancreas: The pancreas is atrophied without evidence of focal abnormality, ductal dilatation, surrounding inflammation or abnormal  enhancement. Spleen: Normal in size without focal abnormality. Adrenals/Urinary Tract: As on recent CT, both adrenal glands are prominent. There is a small left adrenal nodule measuring 1.4 x 2.2 cm. Both adrenal glands demonstrate loss of signal on the gradient echo opposed phase images. Both kidneys appear normal. No evidence of hydronephrosis. Stomach/Bowel: No evidence of bowel wall thickening, distention or surrounding inflammatory change.Fatty ileocecal valve. Vascular/Lymphatic: There are no enlarged abdominal lymph nodes. No significant vascular findings are present. Other: None. Musculoskeletal: No acute or significant osseous findings. Bilateral gynecomastia. IMPRESSION: 1. Mild prominence of both adrenal glands with signal dropout on gradient echo opposed phase images consistent with adenomas or hyperplasia. 2. Hepatic steatosis and pancreatic atrophy. 3. No acute or suspicious findings. Electronically Signed   By: Richardean Sale M.D.   On: 01/01/2016 17:18   Ct Abdomen Pelvis W Contrast  12/31/2015  CLINICAL DATA:  Left upper quadrant abdominal pain with nausea vomiting and chronic constipation. EXAM: CT ABDOMEN AND PELVIS WITH CONTRAST TECHNIQUE: Multidetector CT imaging of the abdomen and pelvis was performed using the standard protocol following bolus administration of intravenous contrast. CONTRAST:  31mL ISOVUE-300 IOPAMIDOL (ISOVUE-300) INJECTION 61% COMPARISON:  None. FINDINGS: Lower chest:  No acute findings.  Small hiatal hernia. Hepatobiliary: No masses or other significant abnormality. Pancreas: No mass, inflammatory changes, or other significant abnormality. Spleen: Within normal limits in size and appearance. Adrenals/Urinary Tract: No masses identified. No evidence of hydronephrosis. There is bilateral mild perirenal fat stranding. There is enlargement of bilateral adrenal glands. The left adrenal gland is noted to contain a tiny calcification inferiorly. Stomach/Bowel: No evidence of  obstruction, inflammatory process, or abnormal fluid collections. The retrocecal appendix is normal. Vascular/Lymphatic: No pathologically enlarged lymph nodes. No evidence of abdominal aortic aneurysm. Moderate calcified atherosclerosis of the distal aorta and bilateral iliac arteries. There are shotty mesenteric lymph nodes. Reproductive: No mass or other significant abnormality. Other: None. Musculoskeletal:  No suspicious bone lesions identified. IMPRESSION: No acute findings within the abdomen or pelvis. Enlargement of bilateral adrenal glands with punctate calcification within the left adrenal gland. This may represent benign adrenal hypertrophy, however a true adrenal masses cannot be excluded. Please correlate to patient's clinical history. If no prior imaging of the adrenal glands is available, CT or MRI of the abdomen adrenal protocol is recommended. Mild bilateral perirenal fat stranding, nonspecific. Electronically Signed   By: Fidela Salisbury M.D.   On: 12/31/2015 18:03    Microbiology: No results found for this or any previous visit (from the past 240 hour(s)).   Labs: Basic Metabolic Panel:  Recent Labs Lab 12/31/15 1500 01/01/16 0520 01/02/16 0500  NA 138 136 141  K 4.2 5.4* 3.6  CL 102 101  108  CO2 25 20* 27  GLUCOSE 175* 393* 73  BUN 21* 27* 24*  CREATININE 1.66* 1.64* 1.59*  CALCIUM 9.4 9.1 8.5*   Liver Function Tests:  Recent Labs Lab 12/31/15 1500 01/01/16 0520  AST 23 21  ALT 17 18  ALKPHOS 109 102  BILITOT 0.6 0.7  PROT 7.5 7.2  ALBUMIN 3.6 3.4*    Recent Labs Lab 12/31/15 1500  LIPASE 19   No results for input(s): AMMONIA in the last 168 hours. CBC:  Recent Labs Lab 12/31/15 1500 12/31/15 2323 01/01/16 0520 01/02/16 0500  WBC 15.4*  --  15.6* 14.1*  NEUTROABS  --  13.8* 14.1*  --   HGB 13.0  --  12.4* 10.9*  HCT 41.4  --  40.5 35.8*  MCV 78.9  --  79.9 79.9  PLT 366  --  355 339   Cardiac Enzymes: No results for input(s): CKTOTAL,  CKMB, CKMBINDEX, TROPONINI in the last 168 hours. BNP: BNP (last 3 results) No results for input(s): BNP in the last 8760 hours.  ProBNP (last 3 results) No results for input(s): PROBNP in the last 8760 hours.  CBG:  Recent Labs Lab 01/01/16 0809 01/01/16 1240 01/01/16 1718 01/01/16 2217 01/02/16 0755  GLUCAP 401* 445* 135* 118* 71       Signed:  Louellen Molder MD.  [facility] 01/02/2016, 8:59 AM

## 2016-01-04 MED ORDER — GADOBENATE DIMEGLUMINE 529 MG/ML IV SOLN
20.0000 mL | Freq: Once | INTRAVENOUS | Status: AC | PRN
  Administered 2016-01-01: 20 mL via INTRAVENOUS

## 2016-02-25 ENCOUNTER — Ambulatory Visit (INDEPENDENT_AMBULATORY_CARE_PROVIDER_SITE_OTHER): Payer: Medicare Other | Admitting: Internal Medicine

## 2016-02-25 ENCOUNTER — Encounter: Payer: Self-pay | Admitting: Internal Medicine

## 2016-02-25 VITALS — BP 122/58 | HR 95 | Ht 70.0 in | Wt 259.6 lb

## 2016-02-25 DIAGNOSIS — R0600 Dyspnea, unspecified: Secondary | ICD-10-CM | POA: Diagnosis not present

## 2016-02-25 NOTE — Patient Instructions (Signed)
Your physician recommends that you continue on your current medications as directed. Please refer to the Current Medication list given to you today. Your physician wants you to follow-up in: 1 year with Dr. Ross.  You will receive a reminder letter in the mail two months in advance. If you don't receive a letter, please call our office to schedule the follow-up appointment.  

## 2016-02-25 NOTE — Progress Notes (Signed)
Cardiology Office Note   Date:  02/25/2016   ID:  Micheal Morris, DOB [birth year], MRN [id number]  PCP:  Chesley Noon, MD  Cardiologist:   Dorris Carnes, MD   Pt presents for f/u of dyspnea   History of Present Illness: Micheal Morris is a 70 y.o. male with a history of Dyspnea  Set up for myoview which was neg   Cardiopulmonary stress test done   Also history of HTN and HL      Breathing OK  No CP   OUt of hospital  Knee replace in April  Hosp for infection in June   Feeling good now      Outpatient Medications Prior to Visit  Medication Sig Dispense Refill  . amLODipine-benazepril (LOTREL) 10-20 MG per capsule TAKE 1 CAPSULE BY MOUTH DAILY    . aspirin 81 MG chewable tablet Chew 81 mg by mouth daily.    . BD PEN NEEDLE NANO U/F 32G X 4 MM MISC Use as directed with insulin  4  . brinzolamide (AZOPT) 1 % ophthalmic suspension Place 1 drop into the right eye 2 (two) times daily.    . Canagliflozin (INVOKANA) 100 MG TABS Take 1 tablet by mouth daily.    . COMBIGAN 0.2-0.5 % ophthalmic solution Place 1 drop into both eyes 2 (two) times daily.     Marland Kitchen gabapentin (NEURONTIN) 300 MG capsule Take 600 mg by mouth 2 (two) times daily.     . insulin glargine (LANTUS) 100 UNIT/ML injection Inject 65 Units into the skin every morning.    . insulin lispro (HUMALOG) 100 UNIT/ML injection Inject 20-40 Units into the skin 3 (three) times daily. Per sliding scale    . Lancets (ONETOUCH ULTRASOFT) lancets Use 1 lancets three (3) times a day with insulin  11  . Liraglutide (VICTOZA) 18 MG/3ML SOPN Inject 1.8 mg as directed daily.     Marland Kitchen LUMIGAN 0.01 % SOLN Place 1 drop into both eyes at bedtime.  3  . metoprolol succinate (TOPROL-XL) 100 MG 24 hr tablet TAKE 1 TABLET BY MOUTH ONCE DAILY    . mirabegron ER (MYRBETRIQ) 25 MG TB24 tablet Take 25 mg by mouth daily.    . Multiple Vitamin (MULTIVITAMIN) tablet Take 1 tablet by mouth daily. Reported on 12/31/2015    . simvastatin (ZOCOR) 20 MG  tablet TAKE 1 TABLET BY MOUTH EVERY DAY AT BEDTIME    . spironolactone (ALDACTONE) 25 MG tablet TAKE 1 TABLET BY MOUTH EVERY DAY    . torsemide (DEMADEX) 20 MG tablet TAKE 1 TABLET EVERY DAY    . AZOPT 1 % ophthalmic suspension Place 1 drop into the right eye 2 (two) times daily.     . ciprofloxacin (CIPRO) 500 MG tablet Take 500 mg by mouth 2 (two) times daily.  0  . metroNIDAZOLE (FLAGYL) 500 MG tablet Take 500 mg by mouth 2 (two) times daily.  0   No facility-administered medications prior to visit.      Allergies:   Nsaids and Aspirin   Past Medical History:  Diagnosis Date  . Arthritis    "knees" (01/01/2016)  . Chronic kidney disease   . Erectile dysfunction   . HTN (hypertension)   . Hyperlipidemia   . Neuromuscular disorder (HCC)    neuropathy  . Obesity   . OSA on CPAP   . Pain of left upper arm   . Restrictive lung disease   . Sensory hearing loss, bilateral   .  SOBOE (shortness of breath on exertion) 11/03/15   currently goes to gym  . Type II diabetes mellitus ([place])     Past Surgical History:  Procedure Laterality Date  . COLONOSCOPY    . GLAUCOMA SURGERY Bilateral    "laser"  . JOINT REPLACEMENT    . TOTAL KNEE ARTHROPLASTY Left 11/09/2015   Procedure: LEFT TOTAL KNEE ARTHROPLASTY;  Surgeon: Gaynelle Arabian, MD;  Location: [facility];  Service: Orthopedics;  Laterality: Left;     Social History:  The patient  reports that he quit smoking about 41 years ago. His smoking use included Cigarettes. He has a 7.00 pack-year smoking history. He has never used smokeless tobacco. He reports that he does not drink alcohol or use drugs.   Family History:  The patient's family history includes Diabetes in his brother, mother, and sister; Hypertension in his brother and mother; Stroke in his mother.    ROS:  Please see the history of present illness. All other systems are reviewed and  Negative to the above problem except as noted.    PHYSICAL EXAM: VS:  BP (!) 122/58    Pulse 95   Ht 5\' 10"  (1.778 m)   Wt 259 lb 9.6 oz (117.8 kg)   SpO2 97%   BMI 37.25 kg/m   GEN: Well nourished, well developed, in no acute distress  HEENT: normal  Neck: no JVD, carotid bruits, or masses Cardiac: RRR; no murmurs, rubs, or gallops,no edema  Respiratory:  clear to auscultation bilaterally, normal work of breathing GI: soft, nontender, nondistended, + BS  No hepatomegaly  MS: no deformity Moving all extremities   Skin: warm and dry, no rash Neuro:  Strength and sensation are intact Psych: euthymic mood, full affect   EKG:  EKG is not  ordered today.   Lipid Panel No results found for: CHOL, TRIG, HDL, CHOLHDL, VLDL, LDLCALC, LDLDIRECT    Wt Readings from Last 3 Encounters:  02/25/16 259 lb 9.6 oz (117.8 kg)  12/31/15 248 lb 14.4 oz (112.9 kg)  11/09/15 273 lb (123.8 kg)      ASSESSMENT AND PLAN:  1  Dyspnea  Improved  Pt feels good  Continue on current meds   2.  HL  Will get labs from Dr Melford Aase  F/U in 1 year     Signed, Dorris Carnes, MD  02/25/2016 10:38 AM    [address], [place], [place]  [address] Phone: [phone]; Fax: [phone]

## 2016-03-09 ENCOUNTER — Telehealth: Payer: Self-pay

## 2016-03-09 ENCOUNTER — Ambulatory Visit: Payer: Medicare Other | Admitting: Podiatry

## 2016-03-09 NOTE — Telephone Encounter (Signed)
lmtcb X1 for pt to bring cpap to ov tomorrow.

## 2016-03-10 ENCOUNTER — Encounter: Payer: Self-pay | Admitting: Pulmonary Disease

## 2016-03-10 ENCOUNTER — Ambulatory Visit (INDEPENDENT_AMBULATORY_CARE_PROVIDER_SITE_OTHER): Payer: Medicare Other | Admitting: Pulmonary Disease

## 2016-03-10 ENCOUNTER — Ambulatory Visit (INDEPENDENT_AMBULATORY_CARE_PROVIDER_SITE_OTHER): Payer: Medicare Other | Admitting: Podiatry

## 2016-03-10 ENCOUNTER — Encounter: Payer: Self-pay | Admitting: Podiatry

## 2016-03-10 VITALS — BP 134/82 | HR 85 | Ht 70.0 in | Wt 258.6 lb

## 2016-03-10 DIAGNOSIS — E119 Type 2 diabetes mellitus without complications: Secondary | ICD-10-CM | POA: Diagnosis not present

## 2016-03-10 DIAGNOSIS — M79673 Pain in unspecified foot: Secondary | ICD-10-CM | POA: Diagnosis not present

## 2016-03-10 DIAGNOSIS — B351 Tinea unguium: Secondary | ICD-10-CM | POA: Diagnosis not present

## 2016-03-10 DIAGNOSIS — Z9989 Dependence on other enabling machines and devices: Principal | ICD-10-CM

## 2016-03-10 DIAGNOSIS — G4733 Obstructive sleep apnea (adult) (pediatric): Secondary | ICD-10-CM

## 2016-03-10 NOTE — Progress Notes (Signed)
Patient ID: Micheal Morris, male   DOB: [birth year], 70 y.o.   MRN: [id number] Complaint:  Visit Type: Patient returns to my office for continued preventative foot care services. 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 The patient presents for preventative foot care services. No changes to ROS  Podiatric Exam: Vascular: dorsalis pedis and posterior tibial pulses are palpable bilateral. Capillary return is immediate. Temperature gradient is WNL. Skin turgor WNL  Sensorium: Normal Semmes Weinstein monofilament test. Normal tactile sensation bilaterally. Nail Exam: Pt has thick disfigured discolored nails with subungual debris noted bilateral entire nail hallux through fifth toenails Ulcer Exam: There is no evidence of ulcer or pre-ulcerative changes or infection. Orthopedic Exam: Muscle tone and strength are WNL. No limitations in general ROM. No crepitus or effusions noted. Foot type and digits show no abnormalities. Bony prominences are unremarkable. Skin: No Porokeratosis. No infection or ulcers  Diagnosis:  Onychomycosis, , Pain in right toe, pain in left toes  Treatment & Plan Procedures and Treatment: Consent by patient was obtained for treatment procedures. The patient understood the discussion of treatment and procedures well. All questions were answered thoroughly reviewed. Debridement of mycotic and hypertrophic toenails, 1 through 5 bilateral and clearing of subungual debris. No ulceration, no infection noted.  Return Visit-Office Procedure: Patient instructed to return to the office for a follow up visit 3 months for continued evaluation and treatment.  Gardiner Barefoot DPM

## 2016-03-10 NOTE — Progress Notes (Signed)
Current Outpatient Prescriptions on File Prior to Visit  Medication Sig  . amLODipine-benazepril (LOTREL) 10-20 MG per capsule TAKE 1 CAPSULE BY MOUTH DAILY  . aspirin 81 MG chewable tablet Chew 81 mg by mouth daily.  . BD PEN NEEDLE NANO U/F 32G X 4 MM MISC Use as directed with insulin  . brinzolamide (AZOPT) 1 % ophthalmic suspension Place 1 drop into the right eye 2 (two) times daily.  . Canagliflozin (INVOKANA) 100 MG TABS Take 1 tablet by mouth daily.  . COMBIGAN 0.2-0.5 % ophthalmic solution Place 1 drop into both eyes 2 (two) times daily.   Marland Kitchen gabapentin (NEURONTIN) 300 MG capsule Take 600 mg by mouth 2 (two) times daily.   . insulin glargine (LANTUS) 100 UNIT/ML injection Inject 65 Units into the skin every morning.  . insulin lispro (HUMALOG) 100 UNIT/ML injection Inject 20-40 Units into the skin 3 (three) times daily. Per sliding scale  . Lancets (ONETOUCH ULTRASOFT) lancets Use 1 lancets three (3) times a day with insulin  . Liraglutide (VICTOZA) 18 MG/3ML SOPN Inject 1.8 mg as directed daily.   Marland Kitchen LUMIGAN 0.01 % SOLN Place 1 drop into both eyes at bedtime.  . metoprolol succinate (TOPROL-XL) 100 MG 24 hr tablet TAKE 1 TABLET BY MOUTH ONCE DAILY  . mirabegron ER (MYRBETRIQ) 25 MG TB24 tablet Take 25 mg by mouth daily.  . Multiple Vitamin (MULTIVITAMIN) tablet Take 1 tablet by mouth daily. Reported on 12/31/2015  . simvastatin (ZOCOR) 20 MG tablet TAKE 1 TABLET BY MOUTH EVERY DAY AT BEDTIME  . spironolactone (ALDACTONE) 25 MG tablet TAKE 1 TABLET BY MOUTH EVERY DAY  . torsemide (DEMADEX) 20 MG tablet TAKE 1 TABLET EVERY DAY   No current facility-administered medications on file prior to visit.      Chief Complaint  Patient presents with  . Follow-up    Pt did not bring CPAP card. Wears CPAP nightly. Denies problems with mask. Pressure seems to be high at times. DME: [facility]     Sleep tests PSG 07/17/02 >> AHI 60  Cardiac tests Echo 10/14/14 >> EF 60 to [id number], grade 1 diastolic  CHF  Past medical hx CKD, ED, HTN, HLD, DM, Neuropathy  Past surgical hx, Allergies, Family hx, Social hx all reviewed.  Vital Signs BP 134/82 (BP Location: Left Arm, Cuff Size: Normal)   Pulse 85   Ht 5\' 10"  (1.778 m)   Wt 258 lb 9.6 oz (117.3 kg)   SpO2 97%   BMI 37.11 kg/m   History of Present Illness Micheal Morris is a 70 y.o. male with obstructive sleep apnea.  He has nasal pillows.  Mild dry mouth.  Gets 6 to 8 hrs sleep per night.  Sleep time varies - he is retired.  Falls asleep 5 to 10 minutes while watching TV.   Physical Exam  General - No distress ENT - No sinus tenderness, no oral exudate, no LAN, MP 4, low soft palate, enlarged tongue Cardiac - s1s2 regular, no murmur Chest - No wheeze/rales/dullness Back - No focal tenderness Abd - Soft, non-tender Ext - No edema Neuro - Normal strength Skin - No rashes Psych - normal mood, and behavior   Assessment/Plan  Obstructive sleep apnea. - he reports compliance and improvement with CPAP - asked him to drop off his CPAP card >> will call with report - discussed techniques to limit mouth dryness   Patient Instructions  Please drop off your CPAP card  Follow up in 1 year  Chesley Mires, MD [facility] Pager:  [phone] 03/10/2016, 9:27 AM

## 2016-03-10 NOTE — Patient Instructions (Signed)
Please drop off your CPAP card  Follow up in 1 year

## 2016-03-11 ENCOUNTER — Other Ambulatory Visit: Payer: Self-pay | Admitting: Gastroenterology

## 2016-03-11 ENCOUNTER — Telehealth: Payer: Self-pay | Admitting: Pulmonary Disease

## 2016-03-11 DIAGNOSIS — I998 Other disorder of circulatory system: Secondary | ICD-10-CM

## 2016-03-11 NOTE — Telephone Encounter (Signed)
CPAP 12/12/15 to 03/10/16 >> used on 88 of 90 nights with average 7 hrs 28 min.  Average AHI 0.4 with CPAP 12 cm H2O.   Will have my nurse inform pt that CPAP report shows excellent control of sleep apnea.

## 2016-03-11 NOTE — Telephone Encounter (Signed)
Results have been explained to patient, pt expressed understanding. Nothing further needed.  

## 2016-03-18 ENCOUNTER — Ambulatory Visit
Admission: RE | Admit: 2016-03-18 | Discharge: 2016-03-18 | Disposition: A | Discharge status: Home / Self Care | Payer: Medicare Other | Source: Ambulatory Visit | Attending: Gastroenterology | Admitting: Gastroenterology

## 2016-03-18 ENCOUNTER — Other Ambulatory Visit: Payer: Self-pay | Admitting: Gastroenterology

## 2016-03-18 ENCOUNTER — Other Ambulatory Visit: Payer: Medicare Other

## 2016-03-18 DIAGNOSIS — I998 Other disorder of circulatory system: Secondary | ICD-10-CM

## 2016-05-24 ENCOUNTER — Ambulatory Visit: Payer: Medicare Other | Admitting: Pulmonary Disease

## 2016-06-01 ENCOUNTER — Ambulatory Visit (INDEPENDENT_AMBULATORY_CARE_PROVIDER_SITE_OTHER): Payer: Medicare Other | Admitting: Podiatry

## 2016-06-01 ENCOUNTER — Encounter: Payer: Self-pay | Admitting: Podiatry

## 2016-06-01 VITALS — Resp 16 | Ht 70.0 in | Wt 258.0 lb

## 2016-06-01 DIAGNOSIS — B351 Tinea unguium: Secondary | ICD-10-CM | POA: Diagnosis not present

## 2016-06-01 DIAGNOSIS — E119 Type 2 diabetes mellitus without complications: Secondary | ICD-10-CM

## 2016-06-01 NOTE — Progress Notes (Signed)
Patient ID: [name], male   DOB: 1946/05/14, 70 y.o.   MRN: [id number] Complaint:  Visit Type: Patient returns to my office for continued preventative foot care services. 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 The patient presents for preventative foot care services. No changes to ROS  Podiatric Exam: Vascular: dorsalis pedis and posterior tibial pulses are palpable bilateral. Capillary return is immediate. Temperature gradient is WNL. Skin turgor WNL  Sensorium: Normal Semmes Weinstein monofilament test. Normal tactile sensation bilaterally. Nail Exam: Pt has thick disfigured discolored nails with subungual debris noted bilateral entire nail hallux through fifth toenails Ulcer Exam: There is no evidence of ulcer or pre-ulcerative changes or infection. Orthopedic Exam: Muscle tone and strength are WNL. No limitations in general ROM. No crepitus or effusions noted. Foot type and digits show no abnormalities. Bony prominences are unremarkable. Skin: No Porokeratosis. No infection or ulcers  Diagnosis:  Onychomycosis, , Pain in right toe, pain in left toes  Treatment & Plan Procedures and Treatment: Consent by patient was obtained for treatment procedures. The patient understood the discussion of treatment and procedures well. All questions were answered thoroughly reviewed. Debridement of mycotic and hypertrophic toenails, 1 through 5 bilateral and clearing of subungual debris. No ulceration, no infection noted.  Return Visit-Office Procedure: Patient instructed to return to the office for a follow up visit 3 months for continued evaluation and treatment.  Gardiner Barefoot DPM

## 2016-08-10 ENCOUNTER — Ambulatory Visit (INDEPENDENT_AMBULATORY_CARE_PROVIDER_SITE_OTHER): Payer: Medicare Other | Admitting: Podiatry

## 2016-08-10 ENCOUNTER — Encounter: Payer: Self-pay | Admitting: Podiatry

## 2016-08-10 VITALS — Ht 70.0 in | Wt 258.0 lb

## 2016-08-10 DIAGNOSIS — M79676 Pain in unspecified toe(s): Secondary | ICD-10-CM

## 2016-08-10 DIAGNOSIS — E119 Type 2 diabetes mellitus without complications: Secondary | ICD-10-CM

## 2016-08-10 DIAGNOSIS — B351 Tinea unguium: Secondary | ICD-10-CM | POA: Diagnosis not present

## 2016-08-10 NOTE — Progress Notes (Signed)
Patient ID: SMITH MATTIA, male   DOB: 1946/06/06, 71 y.o.   MRN: [id number] Complaint:  Visit Type: Patient returns to my office for continued preventative foot care services. 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 The patient presents for preventative foot care services. No changes to ROS  Podiatric Exam: Vascular: dorsalis pedis and posterior tibial pulses are palpable bilateral. Capillary return is immediate. Temperature gradient is WNL. Skin turgor WNL  Sensorium: Normal Semmes Weinstein monofilament test. Normal tactile sensation bilaterally. Nail Exam: Pt has thick disfigured discolored nails with subungual debris noted bilateral entire nail hallux through fifth toenails Ulcer Exam: There is no evidence of ulcer or pre-ulcerative changes or infection. Orthopedic Exam: Muscle tone and strength are WNL. No limitations in general ROM. No crepitus or effusions noted. Foot type and digits show no abnormalities. Bony prominences are unremarkable. Skin: No Porokeratosis. No infection or ulcers  Diagnosis:  Onychomycosis, , Pain in right toe, pain in left toes  Treatment & Plan Procedures and Treatment: Consent by patient was obtained for treatment procedures. The patient understood the discussion of treatment and procedures well. All questions were answered thoroughly reviewed. Debridement of mycotic and hypertrophic toenails, 1 through 5 bilateral and clearing of subungual debris. No ulceration, no infection noted.  Return Visit-Office Procedure: Patient instructed to return to the office for a follow up visit 3 months for continued evaluation and treatment.  Gardiner Barefoot DPM

## 2016-10-19 ENCOUNTER — Ambulatory Visit (INDEPENDENT_AMBULATORY_CARE_PROVIDER_SITE_OTHER): Payer: Medicare Other | Admitting: Podiatry

## 2016-10-19 ENCOUNTER — Encounter: Payer: Self-pay | Admitting: Podiatry

## 2016-10-19 VITALS — Ht 70.0 in | Wt 258.0 lb

## 2016-10-19 DIAGNOSIS — B351 Tinea unguium: Secondary | ICD-10-CM | POA: Diagnosis not present

## 2016-10-19 DIAGNOSIS — M79676 Pain in unspecified toe(s): Secondary | ICD-10-CM

## 2016-10-19 DIAGNOSIS — E119 Type 2 diabetes mellitus without complications: Secondary | ICD-10-CM

## 2016-10-19 NOTE — Progress Notes (Signed)
Patient ID: BURNEY CALZADILLA, male   DOB: 25-Sep-1945, 71 y.o.   MRN: [id number] Complaint:  Visit Type: Patient returns to my office for continued preventative foot care services. 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 The patient presents for preventative foot care services. No changes to ROS  Podiatric Exam: Vascular: dorsalis pedis and posterior tibial pulses are palpable bilateral. Capillary return is immediate. Temperature gradient is WNL. Skin turgor WNL  Sensorium: Normal Semmes Weinstein monofilament test. Normal tactile sensation bilaterally. Nail Exam: Pt has thick disfigured discolored nails with subungual debris noted bilateral entire nail hallux through fifth toenails Ulcer Exam: There is no evidence of ulcer or pre-ulcerative changes or infection. Orthopedic Exam: Muscle tone and strength are WNL. No limitations in general ROM. No crepitus or effusions noted. Foot type and digits show no abnormalities. Bony prominences are unremarkable. Skin: No Porokeratosis. No infection or ulcers  Diagnosis:  Onychomycosis, , Pain in right toe, pain in left toes  Treatment & Plan Procedures and Treatment: Consent by patient was obtained for treatment procedures. The patient understood the discussion of treatment and procedures well. All questions were answered thoroughly reviewed. Debridement of mycotic and hypertrophic toenails, 1 through 5 bilateral and clearing of subungual debris. No ulceration, no infection noted. Patient is not eligible for diabetic shoes. Return Visit-Office Procedure: Patient instructed to return to the office for a follow up visit 3 months for continued evaluation and treatment.  Gardiner Barefoot DPM

## 2016-12-28 ENCOUNTER — Ambulatory Visit (INDEPENDENT_AMBULATORY_CARE_PROVIDER_SITE_OTHER): Payer: Medicare Other | Admitting: Podiatry

## 2016-12-28 ENCOUNTER — Encounter: Payer: Self-pay | Admitting: Podiatry

## 2016-12-28 DIAGNOSIS — E1142 Type 2 diabetes mellitus with diabetic polyneuropathy: Secondary | ICD-10-CM

## 2016-12-28 DIAGNOSIS — M79676 Pain in unspecified toe(s): Secondary | ICD-10-CM | POA: Diagnosis not present

## 2016-12-28 DIAGNOSIS — B351 Tinea unguium: Secondary | ICD-10-CM | POA: Diagnosis not present

## 2016-12-28 NOTE — Progress Notes (Signed)
Patient ID: Micheal Morris, male   DOB: 08/14/1945, 71 y.o.   MRN: [id number] Complaint:  Visit Type: Patient returns to my office for continued preventative foot care services. Complaint: Patient states" my nails have grown long and thick and become painful to walk and wear shoes" Patient has been diagnosed with DM with  neuropathy.. The patient presents for preventative foot care services. No changes to ROS  Podiatric Exam: Vascular: dorsalis pedis and posterior tibial pulses are palpable bilateral. Capillary return is immediate. Temperature gradient is WNL. Skin turgor WNL  Sensorium: Normal Semmes Weinstein monofilament test. Normal tactile sensation bilaterally. Nail Exam: Pt has thick disfigured discolored nails with subungual debris noted bilateral entire nail hallux through fifth toenails Ulcer Exam: There is no evidence of ulcer or pre-ulcerative changes or infection. Orthopedic Exam: Muscle tone and strength are WNL. No limitations in general ROM. No crepitus or effusions noted. Foot type and digits show no abnormalities. Bony prominences are unremarkable. Skin: No Porokeratosis. No infection or ulcers  Diagnosis:  Onychomycosis, , Pain in right toe, pain in left toes  Treatment & Plan Procedures and Treatment: Consent by patient was obtained for treatment procedures. The patient understood the discussion of treatment and procedures well. All questions were answered thoroughly reviewed. Debridement of mycotic and hypertrophic toenails, 1 through 5 bilateral and clearing of subungual debris. No ulceration, no infection noted. Patient is not eligible for diabetic shoes. Return Visit-Office Procedure: Patient instructed to return to the office for a follow up visit 10 weeks  for continued evaluation and treatment.  Gardiner Barefoot DPM

## 2017-03-08 ENCOUNTER — Encounter: Payer: Self-pay | Admitting: Podiatry

## 2017-03-08 ENCOUNTER — Ambulatory Visit (INDEPENDENT_AMBULATORY_CARE_PROVIDER_SITE_OTHER): Payer: Medicare Other | Admitting: Podiatry

## 2017-03-08 DIAGNOSIS — M79676 Pain in unspecified toe(s): Secondary | ICD-10-CM

## 2017-03-08 DIAGNOSIS — B351 Tinea unguium: Secondary | ICD-10-CM

## 2017-03-08 DIAGNOSIS — E1142 Type 2 diabetes mellitus with diabetic polyneuropathy: Secondary | ICD-10-CM | POA: Diagnosis not present

## 2017-03-08 NOTE — Progress Notes (Signed)
Patient ID: Micheal Morris, male   DOB: 01/04/1946, 71 y.o.   MRN: [id number] Complaint:  Visit Type: Patient returns to my office for continued preventative foot care services. Complaint: Patient states" my nails have grown long and thick and become painful to walk and wear shoes" Patient has been diagnosed with DM with  neuropathy.. The patient presents for preventative foot care services. No changes to ROS  Podiatric Exam: Vascular: dorsalis pedis and posterior tibial pulses are palpable bilateral. Capillary return is immediate. Temperature gradient is WNL. Skin turgor WNL  Sensorium: Normal Semmes Weinstein monofilament test. Normal tactile sensation bilaterally. Nail Exam: Pt has thick disfigured discolored nails with subungual debris noted bilateral entire nail hallux through fifth toenails Ulcer Exam: There is no evidence of ulcer or pre-ulcerative changes or infection. Orthopedic Exam: Muscle tone and strength are WNL. No limitations in general ROM. No crepitus or effusions noted. Foot type and digits show no abnormalities. Bony prominences are unremarkable. Skin: No Porokeratosis. No infection or ulcers  Diagnosis:  Onychomycosis, , Pain in right toe, pain in left toes  Treatment & Plan Procedures and Treatment: Consent by patient was obtained for treatment procedures. The patient understood the discussion of treatment and procedures well. All questions were answered thoroughly reviewed. Debridement of mycotic and hypertrophic toenails, 1 through 5 bilateral and clearing of subungual debris. No ulceration, no infection noted. Patient is not eligible for diabetic shoes. Return Visit-Office Procedure: Patient instructed to return to the office for a follow up visit 10 weeks  for continued evaluation and treatment.  Praveen Coia DPM 

## 2017-05-17 ENCOUNTER — Ambulatory Visit: Payer: Medicare Other | Admitting: Podiatry

## 2017-05-24 ENCOUNTER — Ambulatory Visit (INDEPENDENT_AMBULATORY_CARE_PROVIDER_SITE_OTHER): Payer: Medicare Other | Admitting: Podiatry

## 2017-05-24 ENCOUNTER — Encounter: Payer: Self-pay | Admitting: Podiatry

## 2017-05-24 DIAGNOSIS — M79676 Pain in unspecified toe(s): Secondary | ICD-10-CM

## 2017-05-24 DIAGNOSIS — E1142 Type 2 diabetes mellitus with diabetic polyneuropathy: Secondary | ICD-10-CM | POA: Diagnosis not present

## 2017-05-24 DIAGNOSIS — B351 Tinea unguium: Secondary | ICD-10-CM

## 2017-05-24 NOTE — Progress Notes (Signed)
Patient ID: Micheal Morris, male   DOB: 05-21-1946, 71 y.o.   MRN: [id number] Complaint:  Visit Type: Patient returns to my office for continued preventative foot care services. Complaint: Patient states" my nails have grown long and thick and become painful to walk and wear shoes" Patient has been diagnosed with DM with  neuropathy.. The patient presents for preventative foot care services. No changes to ROS  Podiatric Exam: Vascular: dorsalis pedis and posterior tibial pulses are palpable bilateral. Capillary return is immediate. Temperature gradient is WNL. Skin turgor WNL  Sensorium: Normal Semmes Weinstein monofilament test. Normal tactile sensation bilaterally. Nail Exam: Pt has thick disfigured discolored nails with subungual debris noted bilateral entire nail hallux through fifth toenails Ulcer Exam: There is no evidence of ulcer or pre-ulcerative changes or infection. Orthopedic Exam: Muscle tone and strength are WNL. No limitations in general ROM. No crepitus or effusions noted. Foot type and digits show no abnormalities. Bony prominences are unremarkable. Skin: No Porokeratosis. No infection or ulcers  Diagnosis:  Onychomycosis, , Pain in right toe, pain in left toes  Treatment & Plan Procedures and Treatment: Consent by patient was obtained for treatment procedures. The patient understood the discussion of treatment and procedures well. All questions were answered thoroughly reviewed. Debridement of mycotic and hypertrophic toenails, 1 through 5 bilateral and clearing of subungual debris. No ulceration, no infection noted. Patient is not eligible for diabetic shoes. Return Visit-Office Procedure: Patient instructed to return to the office for a follow up visit 10 weeks  for continued evaluation and treatment.  Gardiner Barefoot DPM

## 2017-08-02 ENCOUNTER — Encounter: Payer: Self-pay | Admitting: Podiatry

## 2017-08-02 ENCOUNTER — Ambulatory Visit: Payer: Medicare Other | Admitting: Podiatry

## 2017-08-02 DIAGNOSIS — M79676 Pain in unspecified toe(s): Secondary | ICD-10-CM

## 2017-08-02 DIAGNOSIS — B351 Tinea unguium: Secondary | ICD-10-CM | POA: Diagnosis not present

## 2017-08-02 DIAGNOSIS — E1142 Type 2 diabetes mellitus with diabetic polyneuropathy: Secondary | ICD-10-CM | POA: Diagnosis not present

## 2017-08-02 NOTE — Progress Notes (Signed)
Patient ID: Micheal Morris, male   DOB: [birth year], 72 y.o.   MRN: [id number] Complaint:  Visit Type: Patient returns to my office for continued preventative foot care services. Complaint: Patient states" my nails have grown long and thick and become painful to walk and wear shoes" Patient has been diagnosed with DM with  neuropathy.. The patient presents for preventative foot care services. No changes to ROS  Podiatric Exam: Vascular: dorsalis pedis and posterior tibial pulses are palpable bilateral. Capillary return is immediate. Temperature gradient is WNL. Skin turgor WNL  Sensorium: Normal Semmes Weinstein monofilament test. Normal tactile sensation bilaterally. Nail Exam: Pt has thick disfigured discolored nails with subungual debris noted bilateral entire nail hallux through fifth toenails Ulcer Exam: There is no evidence of ulcer or pre-ulcerative changes or infection. Orthopedic Exam: Muscle tone and strength are WNL. No limitations in general ROM. No crepitus or effusions noted. Foot type and digits show no abnormalities. Bony prominences are unremarkable. Skin: No Porokeratosis. No infection or ulcers  Diagnosis:  Onychomycosis, , Pain in right toe, pain in left toes  Treatment & Plan Procedures and Treatment: Consent by patient was obtained for treatment procedures. The patient understood the discussion of treatment and procedures well. All questions were answered thoroughly reviewed. Debridement of mycotic and hypertrophic toenails, 1 through 5 bilateral and clearing of subungual debris. No ulceration, no infection noted.  Return Visit-Office Procedure: Patient instructed to return to the office for a follow up visit 10 weeks  for continued evaluation and treatment.  Gardiner Barefoot DPM

## 2017-10-13 ENCOUNTER — Ambulatory Visit: Payer: Medicare Other | Admitting: Podiatry

## 2017-10-25 ENCOUNTER — Encounter: Payer: Self-pay | Admitting: Podiatry

## 2017-10-25 ENCOUNTER — Ambulatory Visit: Payer: Medicare Other | Admitting: Podiatry

## 2017-10-25 DIAGNOSIS — B351 Tinea unguium: Secondary | ICD-10-CM | POA: Diagnosis not present

## 2017-10-25 DIAGNOSIS — E1142 Type 2 diabetes mellitus with diabetic polyneuropathy: Secondary | ICD-10-CM | POA: Diagnosis not present

## 2017-10-25 DIAGNOSIS — M79676 Pain in unspecified toe(s): Secondary | ICD-10-CM | POA: Diagnosis not present

## 2017-10-25 NOTE — Progress Notes (Signed)
Patient ID: Micheal Morris, male   DOB: 1945-10-21, 72 y.o.   MRN: [id number] Complaint:  Visit Type: Patient returns to my office for continued preventative foot care services. Complaint: Patient states" my nails have grown long and thick and become painful to walk and wear shoes" Patient has been diagnosed with DM with  neuropathy.. The patient presents for preventative foot care services. No changes to ROS  Podiatric Exam: Vascular: dorsalis pedis and posterior tibial pulses are palpable bilateral. Capillary return is immediate. Temperature gradient is WNL. Skin turgor WNL  Sensorium: Normal Semmes Weinstein monofilament test. Normal tactile sensation bilaterally. Nail Exam: Pt has thick disfigured discolored nails with subungual debris noted bilateral entire nail hallux through fifth toenails Ulcer Exam: There is no evidence of ulcer or pre-ulcerative changes or infection. Orthopedic Exam: Muscle tone and strength are WNL. No limitations in general ROM. No crepitus or effusions noted. Foot type and digits show no abnormalities. Bony prominences are unremarkable. Skin: No Porokeratosis. No infection or ulcers  Diagnosis:  Onychomycosis, , Pain in right toe, pain in left toes  Treatment & Plan Procedures and Treatment: Consent by patient was obtained for treatment procedures. The patient understood the discussion of treatment and procedures well. All questions were answered thoroughly reviewed. Debridement of mycotic and hypertrophic toenails, 1 through 5 bilateral and clearing of subungual debris. No ulceration, no infection noted.  Return Visit-Office Procedure: Patient instructed to return to the office for a follow up visit 10 weeks  for continued evaluation and treatment.  Gardiner Barefoot DPM

## 2018-01-03 ENCOUNTER — Ambulatory Visit: Payer: Medicare Other | Admitting: Podiatry

## 2018-01-03 ENCOUNTER — Encounter: Payer: Self-pay | Admitting: Podiatry

## 2018-01-03 DIAGNOSIS — E1142 Type 2 diabetes mellitus with diabetic polyneuropathy: Secondary | ICD-10-CM | POA: Diagnosis not present

## 2018-01-03 DIAGNOSIS — M79676 Pain in unspecified toe(s): Secondary | ICD-10-CM | POA: Diagnosis not present

## 2018-01-03 DIAGNOSIS — B351 Tinea unguium: Secondary | ICD-10-CM

## 2018-01-03 NOTE — Progress Notes (Signed)
Patient ID: Micheal Morris, male   DOB: 07/08/1946, 72 y.o.   MRN: [id number] Complaint:  Visit Type: Patient returns to my office for continued preventative foot care services. Complaint: Patient states" my nails have grown long and thick and become painful to walk and wear shoes" Patient has been diagnosed with DM with  neuropathy.. The patient presents for preventative foot care services. No changes to ROS  Podiatric Exam: Vascular: dorsalis pedis and posterior tibial pulses are palpable bilateral. Capillary return is immediate. Temperature gradient is WNL. Skin turgor WNL  Sensorium: Normal Semmes Weinstein monofilament test. Normal tactile sensation bilaterally. Nail Exam: Pt has thick disfigured discolored nails with subungual debris noted bilateral entire nail hallux through fifth toenails Ulcer Exam: There is no evidence of ulcer or pre-ulcerative changes or infection. Orthopedic Exam: Muscle tone and strength are WNL. No limitations in general ROM. No crepitus or effusions noted. Foot type and digits show no abnormalities. Bony prominences are unremarkable. Skin: No Porokeratosis. No infection or ulcers  Diagnosis:  Onychomycosis, , Pain in right toe, pain in left toes  Treatment & Plan Procedures and Treatment: Consent by patient was obtained for treatment procedures. The patient understood the discussion of treatment and procedures well. All questions were answered thoroughly reviewed. Debridement of mycotic and hypertrophic toenails, 1 through 5 bilateral and clearing of subungual debris. No ulceration, no infection noted.  Return Visit-Office Procedure: Patient instructed to return to the office for a follow up visit 10 weeks  for continued evaluation and treatment.  Gardiner Barefoot DPM

## 2018-03-14 ENCOUNTER — Ambulatory Visit: Payer: Medicare Other | Admitting: Podiatry

## 2018-03-14 ENCOUNTER — Encounter: Payer: Self-pay | Admitting: Podiatry

## 2018-03-14 DIAGNOSIS — E1142 Type 2 diabetes mellitus with diabetic polyneuropathy: Secondary | ICD-10-CM

## 2018-03-14 DIAGNOSIS — M79676 Pain in unspecified toe(s): Secondary | ICD-10-CM

## 2018-03-14 DIAGNOSIS — B351 Tinea unguium: Secondary | ICD-10-CM | POA: Diagnosis not present

## 2018-03-14 NOTE — Progress Notes (Signed)
Patient ID: Micheal Morris, male   DOB: 1945/12/08, 72 y.o.   MRN: [id number] Complaint:  Visit Type: Patient returns to my office for continued preventative foot care services. Complaint: Patient states" my nails have grown long and thick and become painful to walk and wear shoes" Patient has been diagnosed with DM with  neuropathy.. The patient presents for preventative foot care services. No changes to ROS  Podiatric Exam: Vascular: dorsalis pedis and posterior tibial pulses are palpable bilateral. Capillary return is immediate. Temperature gradient is WNL. Skin turgor WNL  Sensorium: Normal Semmes Weinstein monofilament test. Normal tactile sensation bilaterally. Nail Exam: Pt has thick disfigured discolored nails with subungual debris noted bilateral entire nail hallux through fifth toenails Ulcer Exam: There is no evidence of ulcer or pre-ulcerative changes or infection. Orthopedic Exam: Muscle tone and strength are WNL. No limitations in general ROM. No crepitus or effusions noted. Foot type and digits show no abnormalities. Bony prominences are unremarkable. Skin: No Porokeratosis. No infection or ulcers  Diagnosis:  Onychomycosis, , Pain in right toe, pain in left toes  Treatment & Plan Procedures and Treatment: Consent by patient was obtained for treatment procedures. The patient understood the discussion of treatment and procedures well. All questions were answered thoroughly reviewed. Debridement of mycotic and hypertrophic toenails, 1 through 5 bilateral and clearing of subungual debris. No ulceration, no infection noted.  Return Visit-Office Procedure: Patient instructed to return to the office for a follow up visit 10 weeks  for continued evaluation and treatment.  Gardiner Barefoot DPM

## 2018-05-23 ENCOUNTER — Ambulatory Visit: Payer: Medicare Other | Admitting: Podiatry

## 2018-05-23 ENCOUNTER — Encounter: Payer: Self-pay | Admitting: Podiatry

## 2018-05-23 DIAGNOSIS — E1142 Type 2 diabetes mellitus with diabetic polyneuropathy: Secondary | ICD-10-CM

## 2018-05-23 DIAGNOSIS — M79676 Pain in unspecified toe(s): Secondary | ICD-10-CM | POA: Diagnosis not present

## 2018-05-23 DIAGNOSIS — B351 Tinea unguium: Secondary | ICD-10-CM

## 2018-05-23 NOTE — Progress Notes (Signed)
Patient ID: Micheal Morris, male   DOB: Mar 24, 1946, 72 y.o.   MRN: [id number] Complaint:  Visit Type: Patient returns to my office for continued preventative foot care services. Complaint: Patient states" my nails have grown long and thick and become painful to walk and wear shoes" Patient has been diagnosed with DM with  neuropathy.. The patient presents for preventative foot care services. No changes to ROS  Podiatric Exam: Vascular: dorsalis pedis and posterior tibial pulses are palpable bilateral. Capillary return is immediate. Temperature gradient is WNL. Skin turgor WNL  Sensorium: Normal Semmes Weinstein monofilament test. Normal tactile sensation bilaterally. Nail Exam: Pt has thick disfigured discolored nails with subungual debris noted bilateral entire nail hallux through fifth toenails Ulcer Exam: There is no evidence of ulcer or pre-ulcerative changes or infection. Orthopedic Exam: Muscle tone and strength are WNL. No limitations in general ROM. No crepitus or effusions noted. Foot type and digits show no abnormalities. Bony prominences are unremarkable. Skin: No Porokeratosis. No infection or ulcers  Diagnosis:  Onychomycosis, , Pain in right toe, pain in left toes  Treatment & Plan Procedures and Treatment: Consent by patient was obtained for treatment procedures. The patient understood the discussion of treatment and procedures well. All questions were answered thoroughly reviewed. Debridement of mycotic and hypertrophic toenails, 1 through 5 bilateral and clearing of subungual debris. No ulceration, no infection noted.  Return Visit-Office Procedure: Patient instructed to return to the office for a follow up visit 10 weeks  for continued evaluation and treatment.  Gardiner Barefoot DPM

## 2018-06-03 ENCOUNTER — Other Ambulatory Visit: Payer: Self-pay

## 2018-06-03 ENCOUNTER — Emergency Department (HOSPITAL_COMMUNITY): Payer: Medicare Other

## 2018-06-03 ENCOUNTER — Encounter (HOSPITAL_COMMUNITY): Payer: Self-pay | Admitting: Emergency Medicine

## 2018-06-03 ENCOUNTER — Observation Stay (HOSPITAL_COMMUNITY)
Admission: EM | Admit: 2018-06-03 | Discharge: 2018-06-04 | Disposition: A | Discharge status: Home / Self Care | Payer: Medicare Other | Attending: Internal Medicine | Admitting: Internal Medicine

## 2018-06-03 DIAGNOSIS — N183 Chronic kidney disease, stage 3 unspecified: Secondary | ICD-10-CM | POA: Diagnosis present

## 2018-06-03 DIAGNOSIS — N179 Acute kidney failure, unspecified: Secondary | ICD-10-CM | POA: Diagnosis not present

## 2018-06-03 DIAGNOSIS — E1142 Type 2 diabetes mellitus with diabetic polyneuropathy: Secondary | ICD-10-CM | POA: Diagnosis not present

## 2018-06-03 DIAGNOSIS — N189 Chronic kidney disease, unspecified: Secondary | ICD-10-CM

## 2018-06-03 DIAGNOSIS — H409 Unspecified glaucoma: Secondary | ICD-10-CM | POA: Insufficient documentation

## 2018-06-03 DIAGNOSIS — E1165 Type 2 diabetes mellitus with hyperglycemia: Secondary | ICD-10-CM

## 2018-06-03 DIAGNOSIS — R55 Syncope and collapse: Principal | ICD-10-CM | POA: Diagnosis present

## 2018-06-03 DIAGNOSIS — Z9989 Dependence on other enabling machines and devices: Secondary | ICD-10-CM | POA: Insufficient documentation

## 2018-06-03 DIAGNOSIS — R079 Chest pain, unspecified: Secondary | ICD-10-CM

## 2018-06-03 DIAGNOSIS — I959 Hypotension, unspecified: Secondary | ICD-10-CM | POA: Diagnosis not present

## 2018-06-03 DIAGNOSIS — I129 Hypertensive chronic kidney disease with stage 1 through stage 4 chronic kidney disease, or unspecified chronic kidney disease: Secondary | ICD-10-CM | POA: Insufficient documentation

## 2018-06-03 DIAGNOSIS — E785 Hyperlipidemia, unspecified: Secondary | ICD-10-CM | POA: Insufficient documentation

## 2018-06-03 DIAGNOSIS — E1143 Type 2 diabetes mellitus with diabetic autonomic (poly)neuropathy: Secondary | ICD-10-CM | POA: Insufficient documentation

## 2018-06-03 DIAGNOSIS — Z87891 Personal history of nicotine dependence: Secondary | ICD-10-CM | POA: Insufficient documentation

## 2018-06-03 DIAGNOSIS — Z6839 Body mass index (BMI) 39.0-39.9, adult: Secondary | ICD-10-CM | POA: Insufficient documentation

## 2018-06-03 DIAGNOSIS — N529 Male erectile dysfunction, unspecified: Secondary | ICD-10-CM | POA: Diagnosis not present

## 2018-06-03 DIAGNOSIS — I10 Essential (primary) hypertension: Secondary | ICD-10-CM

## 2018-06-03 DIAGNOSIS — K3184 Gastroparesis: Secondary | ICD-10-CM | POA: Insufficient documentation

## 2018-06-03 DIAGNOSIS — G4733 Obstructive sleep apnea (adult) (pediatric): Secondary | ICD-10-CM | POA: Diagnosis not present

## 2018-06-03 DIAGNOSIS — R1084 Generalized abdominal pain: Secondary | ICD-10-CM

## 2018-06-03 DIAGNOSIS — Z7982 Long term (current) use of aspirin: Secondary | ICD-10-CM | POA: Insufficient documentation

## 2018-06-03 DIAGNOSIS — Z79899 Other long term (current) drug therapy: Secondary | ICD-10-CM | POA: Diagnosis not present

## 2018-06-03 DIAGNOSIS — E1122 Type 2 diabetes mellitus with diabetic chronic kidney disease: Secondary | ICD-10-CM | POA: Insufficient documentation

## 2018-06-03 DIAGNOSIS — Z794 Long term (current) use of insulin: Secondary | ICD-10-CM | POA: Diagnosis not present

## 2018-06-03 DIAGNOSIS — Z96652 Presence of left artificial knee joint: Secondary | ICD-10-CM | POA: Insufficient documentation

## 2018-06-03 DIAGNOSIS — K819 Cholecystitis, unspecified: Secondary | ICD-10-CM | POA: Diagnosis present

## 2018-06-03 LAB — BASIC METABOLIC PANEL
Anion gap: 11 (ref 5–15)
BUN: 30 mg/dL — ABNORMAL HIGH (ref 8–23)
CHLORIDE: 102 mmol/L (ref 98–111)
CO2: 25 mmol/L (ref 22–32)
Calcium: 9.2 mg/dL (ref 8.9–10.3)
Creatinine, Ser: 2.1 mg/dL — ABNORMAL HIGH (ref 0.61–1.24)
GFR, EST AFRICAN AMERICAN: 35 mL/min — AB (ref >60)
GFR, EST NON AFRICAN AMERICAN: 30 mL/min — AB (ref >60)
Glucose, Bld: 144 mg/dL — ABNORMAL HIGH (ref 70–99)
POTASSIUM: 4 mmol/L (ref 3.5–5.1)
Sodium: 138 mmol/L (ref 135–145)

## 2018-06-03 LAB — CBC
HEMATOCRIT: 48.2 % (ref 39.0–52.0)
HEMOGLOBIN: 14.7 g/dL (ref 13.0–17.0)
MCH: 25 pg — ABNORMAL LOW (ref 26.0–34.0)
MCHC: 30.5 g/dL (ref 30.0–36.0)
MCV: 82 fL (ref 80.0–100.0)
NRBC: 0 % (ref 0.0–0.2)
Platelets: 302 10*3/uL (ref 150–400)
RBC: 5.88 MIL/uL — ABNORMAL HIGH (ref 4.22–5.81)
RDW: 15.2 % (ref 11.5–15.5)
WBC: 12.5 10*3/uL — AB (ref 4.0–10.5)

## 2018-06-03 LAB — I-STAT CHEM 8, ED
BUN: 41 mg/dL — ABNORMAL HIGH (ref 8–23)
CALCIUM ION: 1.09 mmol/L — AB (ref 1.15–1.40)
CREATININE: 2 mg/dL — AB (ref 0.61–1.24)
Chloride: 99 mmol/L (ref 98–111)
GLUCOSE: 139 mg/dL — AB (ref 70–99)
HCT: 47 % (ref 39.0–52.0)
HEMOGLOBIN: 16 g/dL (ref 13.0–17.0)
POTASSIUM: 4 mmol/L (ref 3.5–5.1)
Sodium: 139 mmol/L (ref 135–145)
TCO2: 28 mmol/L (ref 22–32)

## 2018-06-03 LAB — I-STAT TROPONIN, ED: TROPONIN I, POC: 0.01 ng/mL (ref 0.00–0.08)

## 2018-06-03 LAB — HEPATIC FUNCTION PANEL
ALT: 17 U/L (ref 0–44)
AST: 28 U/L (ref 15–41)
Albumin: 3.9 g/dL (ref 3.5–5.0)
Alkaline Phosphatase: 93 U/L (ref 38–126)
BILIRUBIN DIRECT: 0.2 mg/dL (ref 0.0–0.2)
BILIRUBIN INDIRECT: 0.3 mg/dL (ref 0.3–0.9)
TOTAL PROTEIN: 7.3 g/dL (ref 6.5–8.1)
Total Bilirubin: 0.5 mg/dL (ref 0.3–1.2)

## 2018-06-03 LAB — URINALYSIS, ROUTINE W REFLEX MICROSCOPIC
Bilirubin Urine: NEGATIVE
Glucose, UA: >1000 mg/dL — AB
Hgb urine dipstick: NEGATIVE
KETONES UR: NEGATIVE mg/dL
LEUKOCYTES UA: NEGATIVE
NITRITE: NEGATIVE
Protein, ur: NEGATIVE mg/dL
SPECIFIC GRAVITY, URINE: 1.015 (ref 1.005–1.030)
pH: 6 (ref 5.0–8.0)

## 2018-06-03 LAB — GLUCOSE, CAPILLARY
GLUCOSE-CAPILLARY: 191 mg/dL — AB (ref 70–99)
Glucose-Capillary: 154 mg/dL — ABNORMAL HIGH (ref 70–99)

## 2018-06-03 LAB — CBG MONITORING, ED: GLUCOSE-CAPILLARY: 138 mg/dL — AB (ref 70–99)

## 2018-06-03 LAB — LIPASE, BLOOD: LIPASE: 40 U/L (ref 11–51)

## 2018-06-03 LAB — D-DIMER, QUANTITATIVE (NOT AT ARMC): D DIMER QUANT: 5.1 ug{FEU}/mL — AB (ref 0.00–0.50)

## 2018-06-03 LAB — TROPONIN I

## 2018-06-03 MED ORDER — ONDANSETRON HCL 4 MG PO TABS: 4.0000 mg | ORAL_TABLET | Freq: Four times a day (QID) | ORAL | Status: DC | PRN

## 2018-06-03 MED ORDER — SENNOSIDES-DOCUSATE SODIUM 8.6-50 MG PO TABS: 1.0000 | ORAL_TABLET | Freq: Every evening | ORAL | Status: DC | PRN

## 2018-06-03 MED ORDER — INSULIN ASPART 100 UNIT/ML ~~LOC~~ SOLN: 0.0000 [IU] | Freq: Every day | SUBCUTANEOUS | Status: DC

## 2018-06-03 MED ORDER — INSULIN ASPART 100 UNIT/ML ~~LOC~~ SOLN
0.0000 [IU] | Freq: Three times a day (TID) | SUBCUTANEOUS | Status: DC
  Administered 2018-06-03: 4 [IU] via SUBCUTANEOUS
  Administered 2018-06-04: 7 [IU] via SUBCUTANEOUS
  Filled 2018-06-03 (×2): qty 1

## 2018-06-03 MED ORDER — HEPARIN BOLUS VIA INFUSION
6000.0000 [IU] | Freq: Once | INTRAVENOUS | Status: AC
  Administered 2018-06-03: 6000 [IU] via INTRAVENOUS
  Filled 2018-06-03: qty 6000

## 2018-06-03 MED ORDER — HYDROMORPHONE HCL 1 MG/ML IJ SOLN
1.0000 mg | Freq: Once | INTRAMUSCULAR | Status: AC
  Administered 2018-06-03: 1 mg via INTRAVENOUS
  Filled 2018-06-03: qty 1

## 2018-06-03 MED ORDER — BRIMONIDINE TARTRATE 0.2 % OP SOLN
1.0000 [drp] | Freq: Two times a day (BID) | OPHTHALMIC | Status: DC
  Administered 2018-06-03 – 2018-06-04 (×2): 1 [drp] via OPHTHALMIC
  Filled 2018-06-03: qty 5

## 2018-06-03 MED ORDER — ONDANSETRON HCL 4 MG/2ML IJ SOLN: 4.0000 mg | Freq: Four times a day (QID) | INTRAMUSCULAR | Status: DC | PRN

## 2018-06-03 MED ORDER — ACETAMINOPHEN 650 MG RE SUPP
650.0000 mg | Freq: Four times a day (QID) | RECTAL | Status: DC | PRN
  Administered 2018-06-03: 650 mg via RECTAL

## 2018-06-03 MED ORDER — BRIMONIDINE TARTRATE-TIMOLOL 0.2-0.5 % OP SOLN: 1.0000 [drp] | Freq: Two times a day (BID) | OPHTHALMIC | Status: DC

## 2018-06-03 MED ORDER — LATANOPROST 0.005 % OP SOLN
1.0000 [drp] | Freq: Every day | OPHTHALMIC | Status: DC
  Administered 2018-06-03: 1 [drp] via OPHTHALMIC
  Filled 2018-06-03: qty 2.5

## 2018-06-03 MED ORDER — GABAPENTIN 300 MG PO CAPS
300.0000 mg | ORAL_CAPSULE | Freq: Two times a day (BID) | ORAL | Status: DC
  Administered 2018-06-03 – 2018-06-04 (×2): 300 mg via ORAL
  Filled 2018-06-03 (×2): qty 1

## 2018-06-03 MED ORDER — INSULIN GLARGINE 100 UNIT/ML ~~LOC~~ SOLN
75.0000 [IU] | Freq: Every day | SUBCUTANEOUS | Status: DC
  Administered 2018-06-04: 75 [IU] via SUBCUTANEOUS
  Filled 2018-06-03: qty 0.75

## 2018-06-03 MED ORDER — BISACODYL 10 MG RE SUPP: 10.0000 mg | Freq: Every day | RECTAL | Status: DC | PRN

## 2018-06-03 MED ORDER — TIMOLOL MALEATE 0.5 % OP SOLN
1.0000 [drp] | Freq: Two times a day (BID) | OPHTHALMIC | Status: DC
  Administered 2018-06-03 – 2018-06-04 (×2): 1 [drp] via OPHTHALMIC
  Filled 2018-06-03: qty 5

## 2018-06-03 MED ORDER — ACETAMINOPHEN 325 MG PO TABS
650.0000 mg | ORAL_TABLET | Freq: Four times a day (QID) | ORAL | Status: DC | PRN
  Filled 2018-06-03: qty 2

## 2018-06-03 MED ORDER — NETARSUDIL DIMESYLATE 0.02 % OP SOLN: 1.0000 [drp] | Freq: Every evening | OPHTHALMIC | Status: DC

## 2018-06-03 MED ORDER — SIMVASTATIN 20 MG PO TABS
20.0000 mg | ORAL_TABLET | Freq: Every day | ORAL | Status: DC
  Administered 2018-06-03: 20 mg via ORAL
  Filled 2018-06-03: qty 1

## 2018-06-03 MED ORDER — SODIUM CHLORIDE 0.9 % IV BOLUS
1000.0000 mL | Freq: Once | INTRAVENOUS | Status: AC
  Administered 2018-06-03: 1000 mL via INTRAVENOUS

## 2018-06-03 MED ORDER — PANTOPRAZOLE SODIUM 40 MG PO TBEC
40.0000 mg | DELAYED_RELEASE_TABLET | Freq: Every day | ORAL | Status: DC
  Administered 2018-06-04: 40 mg via ORAL
  Filled 2018-06-03: qty 1

## 2018-06-03 MED ORDER — HEPARIN (PORCINE) IN NACL 100-0.45 UNIT/ML-% IJ SOLN
2000.0000 [IU]/h | INTRAMUSCULAR | Status: DC
  Administered 2018-06-03: 1600 [IU]/h via INTRAVENOUS
  Filled 2018-06-03 (×2): qty 250

## 2018-06-03 MED ORDER — SODIUM CHLORIDE 0.9 % IV SOLN
INTRAVENOUS | Status: AC
  Administered 2018-06-03: 18:00:00 via INTRAVENOUS

## 2018-06-03 NOTE — ED Notes (Addendum)
Pt wife says she will be leaving bedside briefly, provides cell phone number Rumeal Cullipher, [phone])

## 2018-06-03 NOTE — H&P (Signed)
History and Physical  Patient Name: Micheal Morris     WVP:[id number]    DOB: 1945/08/19    DOA: 06/03/2018 PCP: Chesley Noon, MD  Patient coming from: Home  Chief Complaint: Syncope      HPI: Micheal Morris is a 72 y.o. male with HTN, IDDM, CKD III baseline Cr 1.6, OSA on CPAP, IBS and urinary incontinence who presents with syncope.  The patient was in his usual state of health until this morning, woke up with vague malaise.  Had had a few days of new leg cramps.  Felt "sick" so stayed home from church, then was sitting at the table after eating, when he had a sudden severe pain in his left lower chest or RUQ.  He stood up to "rub this cramp", took a few steps, then passed out.  When he woke up, he came to the ER.  ED course: -Afebrile, HR 53, RR 19, SpO2 100%, BP 86/52 initially, normalized without fluids -Na 138, K 4.0, Cr 2.1 (baseline 1.6), WBC 12.5K, Hgb 14 -Troponin negative -Lipase normal -CXR showed no airspace disease, only cardiomegaly -CT abdomen without ocntrast showed no bleding, no SBO, only mild distended bladder -CT head and maxilla showed no fractures, no intracranial bleeding -Given IV fluids and TRH were asked to evaluate for syncope     ROS: Review of Systems  Constitutional: Positive for malaise/fatigue. Negative for chills and fever.  Respiratory: Negative for cough, hemoptysis, sputum production, shortness of breath and wheezing.   Cardiovascular: Positive for chest pain. Negative for palpitations, orthopnea, claudication, leg swelling and PND.  Gastrointestinal: Negative for abdominal pain, blood in stool, constipation, diarrhea, melena, nausea and vomiting.  All other systems reviewed and are negative.         Past Medical History:  Diagnosis Date  . Arthritis    "knees" (01/01/2016)  . Chronic kidney disease   . Erectile dysfunction   . HTN (hypertension)   . Hyperlipidemia   . Neuromuscular disorder (HCC)    neuropathy  . Obesity     . OSA on CPAP   . Pain of left upper arm   . Restrictive lung disease   . Sensory hearing loss, bilateral   . SOBOE (shortness of breath on exertion) 11/03/15   currently goes to gym  . Type II diabetes mellitus ([place])     Past Surgical History:  Procedure Laterality Date  . COLONOSCOPY    . GLAUCOMA SURGERY Bilateral    "laser"  . JOINT REPLACEMENT    . TOTAL KNEE ARTHROPLASTY Left 11/09/2015   Procedure: LEFT TOTAL KNEE ARTHROPLASTY;  Surgeon: Gaynelle Arabian, MD;  Location: [facility];  Service: Orthopedics;  Laterality: Left;    Social History: Patient lives with his wife.  The patient walks unassisted.  Nonsmoker.  Allergies  Allergen Reactions  . Nsaids Other (See Comments)    GI BLEED  . Aspirin Other (See Comments)    STOMACH ULCER  STOMACH ULCER  STOMACH ULCER  STOMACH ULCER  STOMACH ULCER  STOMACH ULCER  STOMACH ULCER     Family history: family history includes Diabetes in his brother, mother, and sister; Hypertension in his brother and mother; Stroke in his mother.  No family history of DVT or PE.  Prior to Admission medications   Medication Sig Start Date End Date Taking? Authorizing Provider  amLODipine-benazepril (LOTREL) 10-20 MG capsule Take 1 capsule by mouth daily.  12/27/17  Yes [provider]  aspirin 81 MG chewable tablet  Chew 81 mg by mouth daily.   Yes [provider]  bimatoprost (LUMIGAN) 0.01 % SOLN Place 1 drop into both eyes at bedtime.  09/16/14  Yes [provider]  brimonidine-timolol (COMBIGAN) 0.2-0.5 % ophthalmic solution Place 1 drop into both eyes 2 (two) times daily.  04/12/15  Yes [provider]  docusate sodium (COLACE) 100 MG capsule Take 100 mg by mouth 2 (two) times daily.   Yes [provider]  gabapentin (NEURONTIN) 300 MG capsule Take 300 mg by mouth 2 (two) times daily.  10/13/17  Yes [provider]  HUMALOG KWIKPEN 100 UNIT/ML KiwkPen Inject 0-60 Units into the skin 3 (three) times  daily. Per sliding scale three times daily with meals 03/13/18  Yes [provider]  JARDIANCE 25 MG TABS tablet Take 25 mg by mouth daily. 02/21/18  Yes [provider]  LANTUS SOLOSTAR 100 UNIT/ML Solostar Pen Inject 75 Units into the skin daily.  03/06/18  Yes [provider]  linaclotide (LINZESS) 290 MCG CAPS capsule Take 290 mcg by mouth daily.  03/20/18  Yes [provider]  liraglutide (VICTOZA) 18 MG/3ML SOPN Inject 18 mg into the skin daily.  01/03/14  Yes [provider]  metoprolol succinate (TOPROL-XL) 100 MG 24 hr tablet Take 100 mg by mouth daily.  01/30/18  Yes [provider]  Multiple Vitamin (MULTIVITAMIN) tablet Take 1 tablet by mouth daily. Centrum silver   Yes [provider]  MYRBETRIQ 50 MG TB24 tablet Take 50 mg by mouth daily.  05/19/18  Yes [provider]  pantoprazole (PROTONIX) 40 MG tablet Take 40 mg by mouth daily.  04/30/18  Yes [provider]  Probiotic Product ([place]) CAPS Take 1 capsule by mouth daily.   Yes [provider]  RHOPRESSA 0.02 % SOLN Place 1 drop into both eyes every evening.  05/16/18  Yes [provider]  simvastatin (ZOCOR) 20 MG tablet Take 20 mg by mouth daily at 6 PM.  03/06/18  Yes [provider]  spironolactone (ALDACTONE) 25 MG tablet Take 25 mg by mouth daily.  01/30/18  Yes [provider]  torsemide (DEMADEX) 20 MG tablet Take 20 mg by mouth daily.  04/20/18  Yes [provider]  vardenafil (LEVITRA) 20 MG tablet Take 20 mg by mouth as needed for erectile dysfunction.  08/07/12  Yes [provider]  BD PEN NEEDLE NANO U/F 32G X 4 MM MISC USE AS DIRECTED 4 TIMES A DAY 03/30/18   [provider]  Lancets (ONETOUCH ULTRASOFT) lancets USE AS INSTRUCTED 06/28/13   [provider]  ONE TOUCH ULTRA TEST test strip  03/12/18   [provider]       Physical Exam: BP 138/80 (BP  Location: Right Arm)   Pulse 89   Temp 98.2 F (36.8 C)   Resp 18   Wt 123.9 kg   SpO2 96%   BMI 39.19 kg/m  General appearance: Well-developed, adult male, alert and in no acute distress, appears tired.   Eyes: Anicteric, conjunctiva pink, lids and lashes normal. PERRL.    ENT: No nasal deformity, discharge, epistaxis.  Hearing normal. OP moist without lesions.  There is blood in the mouth, no tongue biting.  Teeth normal, normal lips. Neck: No neck masses.  Trachea midline.  No thyromegaly/tenderness. Lymph: No cervical or supraclavicular lymphadenopathy. Skin: Warm and dry.  No jaundice.  No suspicious rashes or lesions. Cardiac: RRR, nl S1-S2, no murmurs appreciated.  Capillary refill is brisk.  JVP not visible.  No LE edema.  Radial pulses 2+ and symmetric. Respiratory: Normal respiratory rate and rhythm.  CTAB without rales or wheezes. Abdomen: Abdomen soft.  Moderate nonfocal TTP, voluntary guarding.  No pain in RUQ. No ascites, distension, hepatosplenomegaly.   MSK: No deformities or effusions.  No cyanosis or clubbing. Neuro: Cranial nerves normal.  Sensation intact to light touch. Speech is fluent.  Muscle strength normal.    Psych: Sensorium intact and responding to questions, attention normal.  Behavior appropriate.  Affect blunted.  Judgment and insight appear normal.     Labs on Admission:  I have personally reviewed following labs and imaging studies: CBC: Recent Labs  Lab 06/03/18 1152 06/03/18 1219  WBC 12.5*  --   HGB 14.7 16.0  HCT 48.2 47.0  MCV 82.0  --   PLT 302  --    Basic Metabolic Panel: Recent Labs  Lab 06/03/18 1152 06/03/18 1219  NA 138 139  K 4.0 4.0  CL 102 99  CO2 25  --   GLUCOSE 144* 139*  BUN 30* 41*  CREATININE 2.10* 2.00*  CALCIUM 9.2  --    GFR: CrCl cannot be calculated (Unknown ideal weight.).  Liver Function Tests: Recent Labs  Lab 06/03/18 1215  AST 28  ALT 17  ALKPHOS 93  BILITOT 0.5  PROT 7.3  ALBUMIN 3.9    Recent Labs  Lab 06/03/18 1215  LIPASE 40   No results for input(s): AMMONIA in the last 168 hours. Coagulation Profile: No results for input(s): INR, PROTIME in the last 168 hours. Cardiac Enzymes: No results for input(s): CKTOTAL, CKMB, CKMBINDEX, TROPONINI in the last 168 hours. BNP (last 3 results) No results for input(s): PROBNP in the last 8760 hours. HbA1C: No results for input(s): HGBA1C in the last 72 hours. CBG: Recent Labs  Lab 06/03/18 1226  GLUCAP 138*   Lipid Profile: No results for input(s): CHOL, HDL, LDLCALC, TRIG, CHOLHDL, LDLDIRECT in the last 72 hours. Thyroid Function Tests: No results for input(s): TSH, T4TOTAL, FREET4, T3FREE, THYROIDAB in the last 72 hours. Anemia Panel: No results for input(s): VITAMINB12, FOLATE, FERRITIN, TIBC, IRON, RETICCTPCT in the last 72 hours. Sepsis Labs: Invalid input(s): PROCALCITONIN, LACTICACIDVEN No results found for this or any previous visit (from the past 240 hour(s)).       Radiological Exams on Admission: Personally reviewed CT abdomen report and CT head report remarkable only for : Ct Abdomen Pelvis Wo Contrast  Result Date: 06/03/2018 CLINICAL DATA:  Abdominal pain, acute generalized pain EXAM: CT ABDOMEN AND PELVIS WITHOUT CONTRAST TECHNIQUE: Multidetector CT imaging of the abdomen and pelvis was performed following the standard protocol without IV contrast. COMPARISON:  CT 12/31/2015 FINDINGS: Lower chest: Lung bases are clear. Hepatobiliary: No focal hepatic lesion. No biliary duct dilatation. Gallbladder is normal. Common bile duct is normal. Pancreas: Pancreas is normal. No ductal dilatation. No pancreatic inflammation. Spleen: Normal spleen Adrenals/urinary tract: Adrenal glands normal. Kidneys, ureters bladder normal. Bladder is mildly distended Stomach/Bowel: Stomach, small bowel, appendix, and cecum are normal. The colon and rectosigmoid colon are normal. Vascular/Lymphatic: Abdominal aorta is normal  caliber with atherosclerotic calcification. There is no retroperitoneal or periportal lymphadenopathy. No pelvic lymphadenopathy. Reproductive: Prostate normal Other: No free fluid. Musculoskeletal: No aggressive osseous lesion. IMPRESSION: No acute abdominopelvic findings. Mildly distended bladder. Electronically Signed   By: Suzy Bouchard M.D.   On: 06/03/2018 14:04   Ct Head Wo Contrast  Result Date: 06/03/2018 CLINICAL  DATA:  Facial trauma EXAM: CT HEAD WITHOUT CONTRAST CT MAXILLOFACIAL WITHOUT CONTRAST TECHNIQUE: Multidetector CT imaging of the head and maxillofacial structures were performed using the standard protocol without intravenous contrast. Multiplanar CT image reconstructions of the maxillofacial structures were also generated. COMPARISON:  None. FINDINGS: CT HEAD FINDINGS Brain: Mild to moderate atrophy. Mild chronic microvascular ischemic change in the white matter. Negative for acute infarct, hemorrhage, mass.  No midline shift. Vascular: Vascular calcification.  Negative for hyperdense vessel Skull: Negative Other: None CT MAXILLOFACIAL FINDINGS Osseous: Negative for facial fracture Orbits: Normal orbital soft tissues.  No hematoma or mass. Sinuses: Mild mucosal edema paranasal sinuses.  No air-fluid level. Soft tissues: Mild supraorbital soft tissue swelling in the scalp right greater than left. IMPRESSION: 1. No acute intracranial abnormality 2. Negative for facial fracture Electronically Signed   By: Franchot Gallo M.D.   On: 06/03/2018 14:05   Dg Chest Portable 1 View  Result Date: 06/03/2018 CLINICAL DATA:  Sudden onset of chest pain. EXAM: PORTABLE CHEST 1 VIEW COMPARISON:  09/22/2014 FINDINGS: Enlarged cardiac silhouette. Low lung volumes with bilateral lower lobe atelectasis. Osseous structures are without acute abnormality. Soft tissues are grossly normal. IMPRESSION: Enlarged cardiac silhouette. Low lung volumes with bilateral lower lobe atelectasis. Electronically Signed    By: Fidela Salisbury M.D.   On: 06/03/2018 13:17   Ct Maxillofacial Wo Contrast  Result Date: 06/03/2018 CLINICAL DATA:  Facial trauma EXAM: CT HEAD WITHOUT CONTRAST CT MAXILLOFACIAL WITHOUT CONTRAST TECHNIQUE: Multidetector CT imaging of the head and maxillofacial structures were performed using the standard protocol without intravenous contrast. Multiplanar CT image reconstructions of the maxillofacial structures were also generated. COMPARISON:  None. FINDINGS: CT HEAD FINDINGS Brain: Mild to moderate atrophy. Mild chronic microvascular ischemic change in the white matter. Negative for acute infarct, hemorrhage, mass.  No midline shift. Vascular: Vascular calcification.  Negative for hyperdense vessel Skull: Negative Other: None CT MAXILLOFACIAL FINDINGS Osseous: Negative for facial fracture Orbits: Normal orbital soft tissues.  No hematoma or mass. Sinuses: Mild mucosal edema paranasal sinuses.  No air-fluid level. Soft tissues: Mild supraorbital soft tissue swelling in the scalp right greater than left. IMPRESSION: 1. No acute intracranial abnormality 2. Negative for facial fracture Electronically Signed   By: Franchot Gallo M.D.   On: 06/03/2018 14:05    EKG: Independently reviewed. ECG showed sinus bradycardia.    Assessment/Plan Principal Problem:   Syncope Active Problems:   OSA (obstructive sleep apnea)   Morbid obesity (HCC)   Essential hypertension   Diabetic peripheral neuropathy (HCC)   Diabetes mellitus with hyperglycemia, with long-term current use of insulin (HCC)   CKD (chronic kidney disease) stage 3, GFR 30-59 ml/min (HCC)   AKI (acute kidney injury) ([place])  Syncope:  In setting of chest pain, impressive cardiomegaly on CXR.  WIthout prodrome.  Differential includes PE and arrhythmia, although still vasovagal event in setting of viral GE more likely. Although blood in mouth, nothing in histroy suggests seizure.  He has a remote history of nonbleeding ulcer, no history of  intracranial bleeding or recent bleeding or recent surgery. -Monitor on tele -IV fluids -Orthostatic vital signs  -Obtain echocardiogram -Given the new severe chest pain, will start heparin empirically  -If D-dimer negative, will stop heparin -If dimer positive, will obtain VQ scan and [place] of both legs to rule out VTE before stopping heparin   DM:  Glucose here normal -Continue Lantus -Hold Jardiance, Victoza -SSI ordered -Continue gabapentin  Hypertension Hypotension on admission:  -IV  fluids gently ovenright -Hold torsemide/Spiro given hypotension -Hold metoprolol, amlodipine, benazepril given hypotension on admission -Continue simvastatin  Acute kidney injury on CKD III:   Baseline Cr 1.6, current Cr 2.1.  Suspect this is dehydration, pre-renal azotemia, but could also be retention given his abdominal discomfort on exam, use of Myrbetriq for last few years -IV fluids -Hold diuretics and ACEi -Hold Myrbetric and check bladder scan  Other medications:  -Continue pantoprazole -Hold linaclotide     DVT prophylaxis: N/A on full dose heparin  Code Status: FULL  Family Communication: Wife and son at bedside  Disposition Plan: Anticipate IV fluids, heparin and rule out PE overnight.  In AM, review telemetry, obtain Echo.  If normal and if patient returned to normal health, likely home tomorrow Consults called: None Admission status: OBS At the point of initial evaluation, it is my clinical opinion that admission for OBSERVATION is reasonable and necessary because the patient's presenting complaints in the context of their chronic conditions represent sufficient risk of deterioration or significant morbidity to constitute reasonable grounds for close observation in the hospital setting, but that the patient may be medically stable for discharge from the hospital within 24 to 48 hours.    Medical decision making: Patient seen at 2:50 PM on 06/03/2018.  The patient was discussed  with Dr. Regenia Skeeter.  What exists of the patient's chart was reviewed in depth and summarized above.  Clinical condition: stable.        Edwin Dada [facility] Pager [phone]

## 2018-06-03 NOTE — Progress Notes (Addendum)
Dr. Loleta Books called nurse back and stated VQ scan can be done in the morning as routine. Provider aware of elevated d-dimer.  Provider stated pt is already being treated for possible PE. Nuclear med tech called and given update.

## 2018-06-03 NOTE — Progress Notes (Signed)
Post residual bladder scan ordered by Dr. Loleta Books. After urinating, patient had 206 cc of urine in bladder via bladder scan.

## 2018-06-03 NOTE — ED Notes (Signed)
Pt returned from CT °

## 2018-06-03 NOTE — ED Provider Notes (Signed)
[place] EMERGENCY DEPARTMENT Provider Note   CSN: [id number] Arrival date & time: 06/03/18  1142     History   Chief Complaint Chief Complaint  Patient presents with  . Chest Pain    HPI THAINE GARRIGA is a 72 y.o. male.  HPI  72 year old male presents with abdominal pain and syncope.  He started having abdominal pain yesterday.  It is mostly lower but essentially diffuse.  Seem to be worse throughout the night.  Today he was not feeling well so he did not go to church.  However his son heard him all of a sudden fall and hit the floor.  He was unresponsive briefly.  Patient had been bleeding from somewhere on his face.  The patient states that he had stood up and then does not remember anything else.  The abdominal pain is not as bad as it was earlier and is about a 5 out of 10.  Feels like a dullness and aching.  There is also pain in his low back.  He is had abdominal pain on and off for a while but has never been able to find out what is causing it.  There is no chest pain or shortness of breath.  Past Medical History:  Diagnosis Date  . Arthritis    "knees" (01/01/2016)  . Chronic kidney disease   . Erectile dysfunction   . HTN (hypertension)   . Hyperlipidemia   . Neuromuscular disorder (HCC)    neuropathy  . Obesity   . OSA on CPAP   . Pain of left upper arm   . Restrictive lung disease   . Sensory hearing loss, bilateral   . SOBOE (shortness of breath on exertion) 11/03/15   currently goes to gym  . Type II diabetes mellitus [facility])     Patient Active Problem List   Diagnosis Date Noted  . Syncope 06/03/2018  . Adrenal adenoma 01/02/2016  . Diabetes mellitus with hyperglycemia, with long-term current use of insulin ([place]) 01/02/2016  . CKD (chronic kidney disease) stage 3, GFR 30-59 ml/min (HCC) 01/02/2016  . SIRS (systemic inflammatory response syndrome) ([place]) 01/02/2016  . Urinary tract infectious disease   . Intractable nausea and vomiting  12/31/2015  . Essential hypertension 12/31/2015  . Glaucoma 12/31/2015  . Diabetic peripheral neuropathy ([place]) 12/31/2015  . Hyperlipidemia 12/31/2015  . Diabetic gastroparesis ([place]) 12/31/2015  . OA (osteoarthritis) of knee 11/09/2015  . Morbid obesity ([place]) 04/27/2015  . OSA (obstructive sleep apnea) 09/05/2011  . Diabetes mellitus ([place]) 02/21/2011    Past Surgical History:  Procedure Laterality Date  . COLONOSCOPY    . GLAUCOMA SURGERY Bilateral    "laser"  . JOINT REPLACEMENT    . 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 Authorizing Provider  amLODipine-benazepril (LOTREL) 10-20 MG capsule Take 1 capsule by mouth daily.  12/27/17  Yes [provider]  aspirin 81 MG chewable tablet Chew 81 mg by mouth daily.   Yes [provider]  bimatoprost (LUMIGAN) 0.01 % SOLN Place 1 drop into both eyes at bedtime.  09/16/14  Yes [provider]  brimonidine-timolol (COMBIGAN) 0.2-0.5 % ophthalmic solution Place 1 drop into both eyes 2 (two) times daily.  04/12/15  Yes [provider]  docusate sodium (COLACE) 100 MG capsule Take 100 mg by mouth 2 (two) times daily.   Yes [provider]  gabapentin (NEURONTIN) 300 MG capsule Take 300 mg by mouth 2 (two) times daily.  10/13/17  Yes [provider]  HUMALOG KWIKPEN 100 UNIT/ML KiwkPen Inject 0-60 Units into the skin 3 (three) times daily. Per sliding scale three times daily with meals 03/13/18  Yes [provider]  JARDIANCE 25 MG TABS tablet Take 25 mg by mouth daily. 02/21/18  Yes [provider]  LANTUS SOLOSTAR 100 UNIT/ML Solostar Pen Inject 75 Units into the skin daily.  03/06/18  Yes [provider]  linaclotide (LINZESS) 290 MCG  CAPS capsule Take 290 mcg by mouth daily.  03/20/18  Yes [provider]  liraglutide (VICTOZA) 18 MG/3ML SOPN Inject 18 mg into the skin daily.  01/03/14  Yes [provider]  metoprolol succinate (TOPROL-XL) 100 MG 24 hr tablet Take 100 mg by mouth daily.  01/30/18  Yes [provider]  Multiple Vitamin (MULTIVITAMIN) tablet Take 1 tablet by mouth daily. Centrum silver   Yes [provider]  MYRBETRIQ 50 MG TB24 tablet Take 50 mg by mouth daily.  05/19/18  Yes [provider]  pantoprazole (PROTONIX) 40 MG tablet Take 40 mg by mouth daily.  04/30/18  Yes [provider]  Probiotic Product ([place]) CAPS Take 1 capsule by mouth daily.   Yes [provider]  RHOPRESSA 0.02 % SOLN Place 1 drop into both eyes every evening.  05/16/18  Yes [provider]  simvastatin (ZOCOR) 20 MG tablet Take 20 mg by mouth daily at 6 PM.  03/06/18  Yes [provider]  spironolactone (ALDACTONE) 25 MG tablet Take 25 mg by mouth daily.  01/30/18  Yes [provider]  torsemide (DEMADEX) 20 MG tablet Take 20 mg by mouth daily.  04/20/18  Yes [provider]  vardenafil (LEVITRA) 20 MG tablet Take 20 mg by mouth as needed for erectile dysfunction.  08/07/12  Yes [provider]  BD PEN NEEDLE NANO U/F 32G X 4 MM MISC USE AS DIRECTED 4 TIMES A DAY 03/30/18   [provider]  Lancets Glory Rosebush ULTRASOFT) lancets USE AS INSTRUCTED 06/28/13   [provider]  ONE TOUCH ULTRA TEST test strip  03/12/18   [provider]    Family History Family History  Problem Relation Age of Onset  . Stroke Mother   . Diabetes Mother   . Hypertension Mother   . Hypertension Brother   . Diabetes Brother   . Diabetes Sister     Social History Social History   Tobacco Use  . Smoking status: Former Smoker    Packs/day: 1.00    Years: 7.00    Pack years: 7.00    Types: Cigarettes    Last  attempt to quit: 08/01/1974    Years since quitting: 43.8  . Smokeless tobacco: Never Used  Substance Use Topics  . Alcohol use: No  . Drug use: No     Allergies   Nsaids and Aspirin   Review of Systems Review of Systems  HENT: Positive for dental problem.   Respiratory: Negative for shortness of breath.   Cardiovascular: Negative for chest pain.  Gastrointestinal: Positive for abdominal pain and constipation. Negative for vomiting.  Musculoskeletal: Positive for back pain.  Neurological: Positive for dizziness, syncope and light-headedness. Negative for headaches.  All other systems reviewed  and are negative.    Physical Exam Updated Vital Signs BP 138/80 (BP Location: Right Arm)   Pulse 89   Temp 98.2 F (36.8 C)   Resp 18   SpO2 96%   Physical Exam  Constitutional: He is oriented to person, place, and time. He appears well-developed and well-nourished.  HENT:  Head: Normocephalic.  Right Ear: External ear normal.  Left Ear: External ear normal.  Nose: Sinus tenderness (swelling) present.  There is some blood in the oropharynx but no obvious loose teeth or laceration  Eyes: Right eye exhibits no discharge. Left eye exhibits no discharge.  Neck: Neck supple. No spinous process tenderness and no muscular tenderness present.  Cardiovascular: Normal rate, regular rhythm and normal heart sounds.  Pulses:      Radial pulses are 2+ on the right side, and 2+ on the left side.       Dorsalis pedis pulses are 2+ on the right side, and 2+ on the left side.  Pulmonary/Chest: Effort normal and breath sounds normal.  Abdominal: Soft. There is tenderness (diffuse, worst in lower abdomen).  Musculoskeletal: He exhibits no edema.  Neurological: He is alert and oriented to person, place, and time.  Skin: Skin is warm and dry.  Psychiatric: His mood appears not anxious.  Nursing note and vitals reviewed.    ED Treatments / Results  Labs (all labs ordered are listed, but only  abnormal results are displayed) Labs Reviewed  BASIC METABOLIC PANEL - Abnormal; Notable for the following components:      Result Value   Glucose, Bld 144 (*)    BUN 30 (*)    Creatinine, Ser 2.10 (*)    GFR calc non Af Amer 30 (*)    GFR calc Af Amer 35 (*)    All other components within normal limits  CBC - Abnormal; Notable for the following components:   WBC 12.5 (*)    RBC 5.88 (*)    MCH 25.0 (*)    All other components within normal limits  URINALYSIS, ROUTINE W REFLEX MICROSCOPIC - Abnormal; Notable for the following components:   Color, Urine YELLOW (*)    APPearance CLEAR (*)    Glucose, UA >1000 (*)    All other components within normal limits  CBG MONITORING, ED - Abnormal; Notable for the following components:   Glucose-Capillary 138 (*)    All other components within normal limits  I-STAT CHEM 8, ED - Abnormal; Notable for the following components:   BUN 41 (*)    Creatinine, Ser 2.00 (*)    Glucose, Bld 139 (*)    Calcium, Ion 1.09 (*)    All other components within normal limits  HEPATIC FUNCTION PANEL  LIPASE, BLOOD  D-DIMER, QUANTITATIVE (NOT AT [facility])  TROPONIN I  TROPONIN I  I-STAT TROPONIN, ED    EKG EKG Interpretation  Date/Time:  Sunday June 03 2018 12:05:19 EST Ventricular Rate:  79 PR Interval:  194 QRS Duration: 89 QT Interval:  391 QTC Calculation: 449 R Axis:   40 Text Interpretation:  Sinus rhythm Low voltage, extremity and precordial leads Minimal ST elevation, lateral leads Baseline wander in lead(s) II III aVF no STEMI. rate is slower but otherwise similar to June 2017 Confirmed by Sherwood Gambler [phone]) on 06/03/2018 12:29:51 PM   Radiology Ct Abdomen Pelvis Wo Contrast  Result Date: 06/03/2018 CLINICAL DATA:  Abdominal pain, acute generalized pain EXAM: CT ABDOMEN AND PELVIS WITHOUT CONTRAST TECHNIQUE: Multidetector CT imaging of the  abdomen and pelvis was performed following the standard protocol without IV contrast.  COMPARISON:  CT 12/31/2015 FINDINGS: Lower chest: Lung bases are clear. Hepatobiliary: No focal hepatic lesion. No biliary duct dilatation. Gallbladder is normal. Common bile duct is normal. Pancreas: Pancreas is normal. No ductal dilatation. No pancreatic inflammation. Spleen: Normal spleen Adrenals/urinary tract: Adrenal glands normal. Kidneys, ureters bladder normal. Bladder is mildly distended Stomach/Bowel: Stomach, small bowel, appendix, and cecum are normal. The colon and rectosigmoid colon are normal. Vascular/Lymphatic: Abdominal aorta is normal caliber with atherosclerotic calcification. There is no retroperitoneal or periportal lymphadenopathy. No pelvic lymphadenopathy. Reproductive: Prostate normal Other: No free fluid. Musculoskeletal: No aggressive osseous lesion. IMPRESSION: No acute abdominopelvic findings. Mildly distended bladder. Electronically Signed   By: Suzy Bouchard M.D.   On: 06/03/2018 14:04   Ct Head Wo Contrast  Result Date: 06/03/2018 CLINICAL DATA:  Facial trauma EXAM: CT HEAD WITHOUT CONTRAST CT MAXILLOFACIAL WITHOUT CONTRAST TECHNIQUE: Multidetector CT imaging of the head and maxillofacial structures were performed using the standard protocol without intravenous contrast. Multiplanar CT image reconstructions of the maxillofacial structures were also generated. COMPARISON:  None. FINDINGS: CT HEAD FINDINGS Brain: Mild to moderate atrophy. Mild chronic microvascular ischemic change in the white matter. Negative for acute infarct, hemorrhage, mass.  No midline shift. Vascular: Vascular calcification.  Negative for hyperdense vessel Skull: Negative Other: None CT MAXILLOFACIAL FINDINGS Osseous: Negative for facial fracture Orbits: Normal orbital soft tissues.  No hematoma or mass. Sinuses: Mild mucosal edema paranasal sinuses.  No air-fluid level. Soft tissues: Mild supraorbital soft tissue swelling in the scalp right greater than left. IMPRESSION: 1. No acute intracranial  abnormality 2. Negative for facial fracture Electronically Signed   By: Franchot Gallo M.D.   On: 06/03/2018 14:05   Dg Chest Portable 1 View  Result Date: 06/03/2018 CLINICAL DATA:  Sudden onset of chest pain. EXAM: PORTABLE CHEST 1 VIEW COMPARISON:  09/22/2014 FINDINGS: Enlarged cardiac silhouette. Low lung volumes with bilateral lower lobe atelectasis. Osseous structures are without acute abnormality. Soft tissues are grossly normal. IMPRESSION: Enlarged cardiac silhouette. Low lung volumes with bilateral lower lobe atelectasis. Electronically Signed   By: Fidela Salisbury M.D.   On: 06/03/2018 13:17   Ct Maxillofacial Wo Contrast  Result Date: 06/03/2018 CLINICAL DATA:  Facial trauma EXAM: CT HEAD WITHOUT CONTRAST CT MAXILLOFACIAL WITHOUT CONTRAST TECHNIQUE: Multidetector CT imaging of the head and maxillofacial structures were performed using the standard protocol without intravenous contrast. Multiplanar CT image reconstructions of the maxillofacial structures were also generated. COMPARISON:  None. FINDINGS: CT HEAD FINDINGS Brain: Mild to moderate atrophy. Mild chronic microvascular ischemic change in the white matter. Negative for acute infarct, hemorrhage, mass.  No midline shift. Vascular: Vascular calcification.  Negative for hyperdense vessel Skull: Negative Other: None CT MAXILLOFACIAL FINDINGS Osseous: Negative for facial fracture Orbits: Normal orbital soft tissues.  No hematoma or mass. Sinuses: Mild mucosal edema paranasal sinuses.  No air-fluid level. Soft tissues: Mild supraorbital soft tissue swelling in the scalp right greater than left. IMPRESSION: 1. No acute intracranial abnormality 2. Negative for facial fracture Electronically Signed   By: Franchot Gallo M.D.   On: 06/03/2018 14:05    Procedures Procedures (including critical care time)   EMERGENCY DEPARTMENT [place] CARDIAC EXAM "Study: Limited Ultrasound of the Heart and Pericardium"  INDICATIONS:Abnormal vital  signs Multiple views of the heart and pericardium were obtained in real-time with a multi-frequency probe.  PERFORMED KG:MWNUUV IMAGES ARCHIVED?: Yes LIMITATIONS:  Body habitus and Emergent procedure VIEWS USED:  Parasternal long axis INTERPRETATION: Cardiac activity present, Pericardial effusion present, Amount of pericardial effusion small and Cardiac tamponade absent   EMERGENCY DEPARTMENT ULTRASOUND  Study: Limited Retroperitoneal Ultrasound of the Abdominal Aorta.  INDICATIONS:Abnormal vital signs, Abdominal pain, Back pain and Age>55 Multiple views of the abdominal aorta were obtained in real-time from the diaphragmatic hiatus to the aortic bifurcation in transverse planes with a multi-frequency probe.  PERFORMED BY: Myself IMAGES ARCHIVED?: No LIMITATIONS:  Body habitus, Bowel gas and Abdominal pain INTERPRETATION:  Very limited. no obvious AAA      Angiocath insertion Performed by: Ephraim Hamburger  Consent: Verbal consent obtained. Risks and benefits: risks, benefits and alternatives were discussed Time out: Immediately prior to procedure a "time out" was called to verify the correct patient, procedure, equipment, support staff and site/side marked as required.  Preparation: Patient was prepped and draped in the usual sterile fashion.  Vein Location: left basilic  Ultrasound Guided  Gauge: 18  Normal blood return and flush without difficulty Patient tolerance: Patient tolerated the procedure well with no immediate complications.    Medications Ordered in ED Medications  sodium chloride 0.9 % bolus 1,000 mL (0 mLs Intravenous Stopped 06/03/18 1425)  HYDROmorphone (DILAUDID) injection 1 mg (1 mg Intravenous Given 06/03/18 1403)     Initial Impression / Assessment and Plan / ED Course  I have reviewed the triage vital signs and the nursing notes.  Pertinent labs & imaging results that were available during my care of the patient were reviewed by me and  considered in my medical decision making (see chart for details).     Patient has not had any chest pain during this time.  Given his hypotension, syncope and abdominal pain, I performed a bedside ultrasound.  Windows are very difficult due to his obesity.  However there is a possible small pericardial effusion but no tamponade that would suggest the cause of his hypotension.  As far as his abdominal exam I did not see any obvious in the abdomen or obvious AAA.  CT obtained which is also unremarkable overall.  He does tell me he has had on and off abdominal pain for a while that is similar but not as severe as this.  No causes ever been found on multiple work-ups.  As far as the syncope, he does have a mild acute kidney injury on his chronic kidney baseline.  He will need IV fluids and further monitoring.  Dr. Loleta Books to admit.  Given no chest pain or shortness of breath and all of his abdominal and back symptoms are low, I have low suspicion for an acute dissection or PE.  Final Clinical Impressions(s) / ED Diagnoses   Final diagnoses:  Syncope and collapse  Abdominal pain, generalized  Acute kidney injury superimposed on chronic kidney disease [facility])    ED Discharge Orders    None       Sherwood Gambler, MD 06/03/18 1639

## 2018-06-03 NOTE — ED Triage Notes (Signed)
Pt reports he had sudden onset of left chest pain/soreness and then had a syncopal episode at home. Pt has blood to his nose and mouth from the fall, where he fell face forwards. Pt still has chest pain to left chest and abdomen.

## 2018-06-03 NOTE — ED Notes (Signed)
Pt in CT, family aware of need for urine sample upon return

## 2018-06-03 NOTE — ED Notes (Signed)
ED Provider at bedside. 

## 2018-06-03 NOTE — Progress Notes (Signed)
Attempted to call MD and give D Dimer result ; no answer and left voicemail

## 2018-06-03 NOTE — Progress Notes (Addendum)
[place] for Heparin Indication: pulmonary embolus  Allergies  Allergen Reactions  . Nsaids Other (See Comments)    GI BLEED  . Aspirin Other (See Comments)    STOMACH ULCER  STOMACH ULCER  STOMACH ULCER  STOMACH ULCER  STOMACH ULCER  STOMACH ULCER  STOMACH ULCER    Patient Measurements: Weight: 273 lb 2.4 oz (123.9 kg) Heparin Dosing Weight: 101 kg  Vital Signs: Temp: 98.2 F (36.8 C) (11/03 1634) Temp Source: Axillary (11/03 1348) BP: 138/80 (11/03 1634) Pulse Rate: 89 (11/03 1634)  Labs: Recent Labs    06/03/18 1152 06/03/18 1219  HGB 14.7 16.0  HCT 48.2 47.0  PLT 302  --   CREATININE 2.10* 2.00*   CrCl cannot be calculated (Unknown ideal weight.).  Assessment: 5 yoM presenting with sudden onset L chest and abdominal pain and a syncopal episode. Pharmacy consulted to dose IV heparin for r/o PE. No AC PTA Initial heparin level <0.1 units/ml.  No issues noted with infusion per RN Goal of Therapy:  Heparin level 0.3-0.7 units/ml Monitor platelets by anticoagulation protocol: Yes   Plan:  Heparin bolus 5000 units IV x1 Increase heparin gtt to 2000 units/hr Check heparin level in 6-8 hours Daily heparin level and CBC Monitor s/sx of bleeding  Thanks for allowing pharmacy to be a part of this patient's care.  Excell Seltzer, PharmD Clinical Pharmacist

## 2018-06-03 NOTE — ED Notes (Signed)
Patient transported to CT 

## 2018-06-03 NOTE — ED Notes (Signed)
Portable x-ray at the bedside.  

## 2018-06-04 ENCOUNTER — Observation Stay (HOSPITAL_BASED_OUTPATIENT_CLINIC_OR_DEPARTMENT_OTHER): Payer: Medicare Other

## 2018-06-04 ENCOUNTER — Observation Stay (HOSPITAL_COMMUNITY): Payer: Medicare Other

## 2018-06-04 DIAGNOSIS — R55 Syncope and collapse: Secondary | ICD-10-CM | POA: Diagnosis not present

## 2018-06-04 DIAGNOSIS — R609 Edema, unspecified: Secondary | ICD-10-CM

## 2018-06-04 DIAGNOSIS — I5030 Unspecified diastolic (congestive) heart failure: Secondary | ICD-10-CM | POA: Diagnosis not present

## 2018-06-04 DIAGNOSIS — K819 Cholecystitis, unspecified: Secondary | ICD-10-CM | POA: Diagnosis present

## 2018-06-04 LAB — TROPONIN I

## 2018-06-04 LAB — GLUCOSE, CAPILLARY
GLUCOSE-CAPILLARY: 212 mg/dL — AB (ref 70–99)
Glucose-Capillary: 175 mg/dL — ABNORMAL HIGH (ref 70–99)

## 2018-06-04 LAB — CBC
HEMATOCRIT: 42.1 % (ref 39.0–52.0)
HEMOGLOBIN: 13.2 g/dL (ref 13.0–17.0)
MCH: 25 pg — AB (ref 26.0–34.0)
MCHC: 31.4 g/dL (ref 30.0–36.0)
MCV: 79.7 fL — AB (ref 80.0–100.0)
PLATELETS: 277 10*3/uL (ref 150–400)
RBC: 5.28 MIL/uL (ref 4.22–5.81)
RDW: 15.2 % (ref 11.5–15.5)
WBC: 15.7 10*3/uL — ABNORMAL HIGH (ref 4.0–10.5)
nRBC: 0 % (ref 0.0–0.2)

## 2018-06-04 LAB — BASIC METABOLIC PANEL
Anion gap: 9 (ref 5–15)
BUN: 35 mg/dL — AB (ref 8–23)
CO2: 25 mmol/L (ref 22–32)
CREATININE: 1.92 mg/dL — AB (ref 0.61–1.24)
Calcium: 8.3 mg/dL — ABNORMAL LOW (ref 8.9–10.3)
Chloride: 102 mmol/L (ref 98–111)
GFR calc Af Amer: 39 mL/min — ABNORMAL LOW (ref >60)
GFR calc non Af Amer: 33 mL/min — ABNORMAL LOW (ref >60)
GLUCOSE: 174 mg/dL — AB (ref 70–99)
Potassium: 4 mmol/L (ref 3.5–5.1)
SODIUM: 136 mmol/L (ref 135–145)

## 2018-06-04 LAB — ECHOCARDIOGRAM COMPLETE: WEIGHTICAEL: 4370.4 [oz_av]

## 2018-06-04 LAB — HEPARIN LEVEL (UNFRACTIONATED): Heparin Unfractionated: <0.1 IU/mL — ABNORMAL LOW (ref 0.30–0.70)

## 2018-06-04 MED ORDER — MAGIC MOUTHWASH W/LIDOCAINE: 10.0000 mL | Freq: Four times a day (QID) | ORAL | 0 refills | Status: AC

## 2018-06-04 MED ORDER — TECHNETIUM TO 99M ALBUMIN AGGREGATED
4.2200 | Freq: Once | INTRAVENOUS | Status: AC | PRN
  Administered 2018-06-04: 4.22 via INTRAVENOUS

## 2018-06-04 MED ORDER — TECHNETIUM TC 99M DIETHYLENETRIAME-PENTAACETIC ACID
30.8000 | Freq: Once | INTRAVENOUS | Status: AC | PRN
  Administered 2018-06-04: 30.8 via RESPIRATORY_TRACT

## 2018-06-04 MED ORDER — LIRAGLUTIDE 18 MG/3ML ~~LOC~~ SOPN: 1.8000 mg | PEN_INJECTOR | Freq: Every day | SUBCUTANEOUS | Status: DC

## 2018-06-04 MED ORDER — MAGIC MOUTHWASH W/LIDOCAINE
10.0000 mL | Freq: Four times a day (QID) | ORAL | Status: DC
  Administered 2018-06-04: 10 mL via ORAL
  Filled 2018-06-04 (×3): qty 10

## 2018-06-04 MED ORDER — BACITRACIN ZINC 500 UNIT/GM EX OINT: TOPICAL_OINTMENT | Freq: Two times a day (BID) | CUTANEOUS | 0 refills | Status: DC

## 2018-06-04 MED ORDER — HEPARIN BOLUS VIA INFUSION
5000.0000 [IU] | Freq: Once | INTRAVENOUS | Status: AC
  Administered 2018-06-04: 5000 [IU] via INTRAVENOUS
  Filled 2018-06-04: qty 5000

## 2018-06-04 MED ORDER — BACITRACIN ZINC 500 UNIT/GM EX OINT
TOPICAL_OINTMENT | Freq: Two times a day (BID) | CUTANEOUS | Status: DC
  Administered 2018-06-04: 16:00:00 via TOPICAL
  Filled 2018-06-04: qty 28.4

## 2018-06-04 NOTE — Progress Notes (Signed)
LE venous duplex prelim: negative for DVT. Dezarai Prew Eunice, RDMS, RVT  

## 2018-06-04 NOTE — Discharge Summary (Signed)
Physician Discharge Summary  Micheal Morris QVZ:[id number] DOB: [birth year] DOA: 06/03/2018  PCP: Chesley Noon, MD  Admit date: 06/03/2018 Discharge date: 06/04/2018  Admitted From: Home Disposition: Home  Recommendations for Outpatient Follow-up:  1. Follow up with Melford Morris in 1 to 2 days to arrange for an outpatient gallbladder scan. 2. Please obtain BMP/CBC in one week   Home Health: No Equipment/Devices: None   Discharge Condition: Stable  CODE STATUS: Full code  Diet recommendation: Low-salt carb modified  Brief/Interim Summary: Micheal Morris is a 72 y.o. male with HTN, IDDM, CKD III baseline Cr 1.6, OSA on CPAP, IBS and urinary incontinence who presents with syncope. The patient was in his usual state of health until this morning, woke up with vague malaise.  Had had a few days of new leg cramps.  Felt "sick" so stayed home from church, then was sitting at the table after eating, when he had a sudden severe pain in his left lower chest or RUQ.  He stood up to "rub this cramp", took a few steps, then passed out.  Eating physician had very strong suspicion for pulmonary embolism or cardiac disorder and therefore work-up ensued.  A VQ scan was obtained which was negative.  Echocardiogram was unremarkable.  Lower extremity venous Dopplers were negative for acute deep venous thrombosis. I suspected gallbladder disease and ordered a right upper quadrant ultrasound after discussion with the patient revealed that he had been to admitted to a hospital in [facility] last December and was diagnosed with gallbladder disease.  Surgeon did not feel that surgery was indicated at that time but patient then reported that he had a gallbladder ejection fraction of 2%.  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  Micheal Morris for further evaluation in the next 1 to 2 days and consideration for nuclear gallbladder scan and possible referral outpatient to a gastroenterologist.  Patient has reached maximal benefit of hospitalization.  Discharge diagnosis, prognosis, plans, follow-up, medications and treatments discussed with the patient(or responsible party) and is in agreement with the plans as described.  Patient is stable for discharge.  Discharge Diagnoses:  Principal Problem:   Syncope Active Problems:   Acalculous cholecystitis   OSA (obstructive sleep apnea)   Morbid obesity (HCC)   Essential hypertension   Diabetic peripheral neuropathy (HCC)   Diabetes mellitus with hyperglycemia, with long-term current use of insulin (HCC)   CKD (chronic kidney disease) stage 3, GFR 30-59 ml/min (HCC)   AKI (acute kidney injury) [facility])    Discharge Instructions  Discharge Instructions    Diet - low sodium heart healthy   Complete by:  As directed    Discharge instructions   Complete by:  As directed    Follow-up with Dr. Melford Morris to arrange an outpatient gallbladder scan.   Increase activity slowly   Complete by:  As directed      Allergies as of 06/04/2018      Reactions   Nsaids Other (See Comments)   GI BLEED   Aspirin Other (See Comments)   STOMACH ULCER  STOMACH ULCER  STOMACH ULCER  STOMACH ULCER  STOMACH ULCER  STOMACH ULCER  STOMACH ULCER       Medication List    TAKE these medications  amLODipine-benazepril 10-20 MG capsule Commonly known as:  LOTREL Take 1 capsule by mouth daily.   aspirin 81 MG chewable tablet Chew 81 mg by mouth daily.   bacitracin ointment Apply topically 2 (two) times daily.   BD PEN NEEDLE NANO U/F 32G X 4 MM Misc Generic drug:  Insulin Pen Needle USE AS DIRECTED 4 TIMES A DAY   COLACE 100 MG capsule Generic drug:  docusate sodium Take 100 mg by mouth  2 (two) times daily.   COMBIGAN 0.2-0.5 % ophthalmic solution Generic drug:  brimonidine-timolol Place 1 drop into both eyes 2 (two) times daily.   gabapentin 300 MG capsule Commonly known as:  NEURONTIN Take 300 mg by mouth 2 (two) times daily.   HUMALOG KWIKPEN 100 UNIT/ML KiwkPen Generic drug:  insulin lispro Inject 0-60 Units into the skin 3 (three) times daily. Per sliding scale three times daily with meals   JARDIANCE 25 MG Tabs tablet Generic drug:  empagliflozin Take 25 mg by mouth daily.   LANTUS SOLOSTAR 100 UNIT/ML Solostar Pen Generic drug:  Insulin Glargine Inject 75 Units into the skin daily.   LEVITRA 20 MG tablet Generic drug:  vardenafil Take 20 mg by mouth as needed for erectile dysfunction.   LINZESS 290 MCG Caps capsule Generic drug:  linaclotide Take 290 mcg by mouth daily.   liraglutide 18 MG/3ML Sopn Commonly known as:  VICTOZA Inject 0.3 mLs (1.8 mg total) into the skin daily. What changed:  how much to take   LUMIGAN 0.01 % Soln Generic drug:  bimatoprost Place 1 drop into both eyes at bedtime.   magic mouthwash w/lidocaine Soln Take 10 mLs by mouth 4 (four) times daily for 5 days.   metoprolol succinate 100 MG 24 hr tablet Commonly known as:  TOPROL-XL Take 100 mg by mouth daily.   multivitamin tablet Take 1 tablet by mouth daily. Centrum silver   MYRBETRIQ 50 MG Tb24 tablet Generic drug:  mirabegron ER Take 50 mg by mouth daily.   ONE TOUCH ULTRA TEST test strip Generic drug:  glucose blood   onetouch ultrasoft lancets USE AS INSTRUCTED   pantoprazole 40 MG tablet Commonly known as:  PROTONIX Take 40 mg by mouth daily.   PHILLIPS COLON HEALTH Caps Take 1 capsule by mouth daily.   RHOPRESSA 0.02 % Soln Generic drug:  Netarsudil Dimesylate Place 1 drop into both eyes every evening.   simvastatin 20 MG tablet Commonly known as:  ZOCOR Take 20 mg by mouth daily at 6 PM.   spironolactone 25 MG tablet Commonly known as:   ALDACTONE Take 25 mg by mouth daily.   torsemide 20 MG tablet Commonly known as:  DEMADEX Take 20 mg by mouth daily.      Follow-up Information    Chesley Noon, MD. Schedule an appointment as soon as possible for a visit in 2 day(s).   Specialty:  Family Medicine Why:  to arrange Nuc Med Liver Spleen scan for GB EF (functional test of your Gallbladder) Contact information: [address] [place] [address] [phone]          Allergies  Allergen Reactions  . Nsaids Other (See Comments)    GI BLEED  . Aspirin Other (See Comments)    STOMACH ULCER  STOMACH ULCER  STOMACH ULCER  STOMACH ULCER  STOMACH ULCER  STOMACH ULCER  STOMACH ULCER      Procedures/Studies: Ct Abdomen Pelvis Wo Contrast  Result Date: 06/03/2018 CLINICAL DATA:  Abdominal pain,  acute generalized pain EXAM: CT ABDOMEN AND PELVIS WITHOUT CONTRAST TECHNIQUE: Multidetector CT imaging of the abdomen and pelvis was performed following the standard protocol without IV contrast. COMPARISON:  CT 12/31/2015 FINDINGS: Lower chest: Lung bases are clear. Hepatobiliary: No focal hepatic lesion. No biliary duct dilatation. Gallbladder is normal. Common bile duct is normal. Pancreas: Pancreas is normal. No ductal dilatation. No pancreatic inflammation. Spleen: Normal spleen Adrenals/urinary tract: Adrenal glands normal. Kidneys, ureters bladder normal. Bladder is mildly distended Stomach/Bowel: Stomach, small bowel, appendix, and cecum are normal. The colon and rectosigmoid colon are normal. Vascular/Lymphatic: Abdominal aorta is normal caliber with atherosclerotic calcification. There is no retroperitoneal or periportal lymphadenopathy. No pelvic lymphadenopathy. Reproductive: Prostate normal Other: No free fluid. Musculoskeletal: No aggressive osseous lesion. IMPRESSION: No acute abdominopelvic findings. Mildly distended bladder. Electronically Signed   By: Suzy Bouchard M.D.   On: 06/03/2018 14:04   Ct  Head Wo Contrast  Result Date: 06/03/2018 CLINICAL DATA:  Facial trauma EXAM: CT HEAD WITHOUT CONTRAST CT MAXILLOFACIAL WITHOUT CONTRAST TECHNIQUE: Multidetector CT imaging of the head and maxillofacial structures were performed using the standard protocol without intravenous contrast. Multiplanar CT image reconstructions of the maxillofacial structures were also generated. COMPARISON:  None. FINDINGS: CT HEAD FINDINGS Brain: Mild to moderate atrophy. Mild chronic microvascular ischemic change in the white matter. Negative for acute infarct, hemorrhage, mass.  No midline shift. Vascular: Vascular calcification.  Negative for hyperdense vessel Skull: Negative Other: None CT MAXILLOFACIAL FINDINGS Osseous: Negative for facial fracture Orbits: Normal orbital soft tissues.  No hematoma or mass. Sinuses: Mild mucosal edema paranasal sinuses.  No air-fluid level. Soft tissues: Mild supraorbital soft tissue swelling in the scalp right greater than left. IMPRESSION: 1. No acute intracranial abnormality 2. Negative for facial fracture Electronically Signed   By: Franchot Gallo M.D.   On: 06/03/2018 14:05   Nm Pulmonary Perf And Vent  Result Date: 06/04/2018 CLINICAL DATA:  Left chest pain and soreness with syncopal episode at home. Recent fall with facial injury. EXAM: NUCLEAR MEDICINE VENTILATION - PERFUSION LUNG SCAN TECHNIQUE: Ventilation images were obtained in multiple projections using inhaled aerosol Tc-58m DTPA. Perfusion images were obtained in multiple projections after intravenous injection of Tc-35m-MAA. RADIOPHARMACEUTICALS:  30.8 mCi of Tc-27m DTPA aerosol inhalation and 4.22 mCi Tc36m-MAA IV COMPARISON:  Chest x-ray 06/03/2018 FINDINGS: Ventilation: No focal ventilation defect. Perfusion: No wedge shaped peripheral perfusion defects to suggest acute pulmonary embolism. IMPRESSION: Normal ventilation perfusion lung scan without evidence of pulmonary embolism. Electronically Signed   By: Marin Olp M.D.    On: 06/04/2018 10:45   Dg Chest Portable 1 View  Result Date: 06/03/2018 CLINICAL DATA:  Sudden onset of chest pain. EXAM: PORTABLE CHEST 1 VIEW COMPARISON:  09/22/2014 FINDINGS: Enlarged cardiac silhouette. Low lung volumes with bilateral lower lobe atelectasis. Osseous structures are without acute abnormality. Soft tissues are grossly normal. IMPRESSION: Enlarged cardiac silhouette. Low lung volumes with bilateral lower lobe atelectasis. Electronically Signed   By: Fidela Salisbury M.D.   On: 06/03/2018 13:17   Ct Maxillofacial Wo Contrast  Result Date: 06/03/2018 CLINICAL DATA:  Facial trauma EXAM: CT HEAD WITHOUT CONTRAST CT MAXILLOFACIAL WITHOUT CONTRAST TECHNIQUE: Multidetector CT imaging of the head and maxillofacial structures were performed using the standard protocol without intravenous contrast. Multiplanar CT image reconstructions of the maxillofacial structures were also generated. COMPARISON:  None. FINDINGS: CT HEAD FINDINGS Brain: Mild to moderate atrophy. Mild chronic microvascular ischemic change in the white matter. Negative for acute infarct, hemorrhage, mass.  No  midline shift. Vascular: Vascular calcification.  Negative for hyperdense vessel Skull: Negative Other: None CT MAXILLOFACIAL FINDINGS Osseous: Negative for facial fracture Orbits: Normal orbital soft tissues.  No hematoma or mass. Sinuses: Mild mucosal edema paranasal sinuses.  No air-fluid level. Soft tissues: Mild supraorbital soft tissue swelling in the scalp right greater than left. IMPRESSION: 1. No acute intracranial abnormality 2. Negative for facial fracture Electronically Signed   By: Franchot Gallo M.D.   On: 06/03/2018 14:05   US Abdomen Limited Ruq  Result Date: 06/04/2018 CLINICAL DATA:  Right-sided chest pain for 1 year.  Worsening today. EXAM: ULTRASOUND ABDOMEN LIMITED RIGHT UPPER QUADRANT COMPARISON:  None. FINDINGS: Gallbladder: No gallstones or wall thickening visualized. No sonographic Murphy sign  noted by sonographer. Common bile duct: Diameter: 4 mm in caliber Liver: The liver is diffusely heterogeneous in echotexture but without focal mass. Portal vein is patent on color Doppler imaging with normal direction of blood flow towards the liver. IMPRESSION: The liver is heterogeneous without focal mass. This finding is nonspecific but can be seen with diffuse hepatic parenchymal disease. Correlation with liver function tests is recommended. Normal gallbladder and biliary tree. Electronically Signed   By: Marybelle Killings M.D.   On: 06/04/2018 09:45    Echocardiogram: LVEF 60-65%, moderate LVH, LV apical false tendon, grade 1 DD, indeterminate LV filling pressure, trivial MR, normal LA size, normal IVC.  Subjective: Patient feeling better.  No recurrent episodes of sudden severe pain in the left lower chest or right upper quadrant.  He does have some soreness  Discharge Exam: Vitals:   06/04/18 1100 06/04/18 1300  BP: (!) 150/70 126/69  Pulse: 96 98  Resp: 18 18  Temp: 98.5 F (36.9 C)   SpO2: 100% 96%   Vitals:   06/03/18 1838 06/04/18 0653 06/04/18 1100 06/04/18 1300  BP: 129/69 122/66 (!) 150/70 126/69  Pulse: 99 77 96 98  Resp: 18 18 18 18   Temp: 98.8 F (37.1 C) 98.5 F (36.9 C) 98.5 F (36.9 C)   TempSrc: Oral Oral Oral   SpO2: 100% 96% 100% 96%  Weight:        General: Micheal Morris, Micheal Morris, not in acute distress Cardiovascular: RRR, S1/S2 +, no rubs, no gallops Respiratory: CTA bilaterally, no wheezing, no rhonchi Abdominal: Soft, NT, ND, bowel sounds + Extremities: no edema, no cyanosis    The results of significant diagnostics from this hospitalization (including imaging, microbiology, ancillary and laboratory) are listed below for reference.     Microbiology: No results found for this or any previous visit (from the past 240 hour(s)).   Labs: BNP (last 3 results) No results for input(s): BNP in the last 8760 hours. Basic Metabolic Panel: Recent Labs  Lab  06/03/18 1152 06/03/18 1219 06/04/18 0236  NA 138 139 136  K 4.0 4.0 4.0  CL 102 99 102  CO2 25  --  25  GLUCOSE 144* 139* 174*  BUN 30* 41* 35*  CREATININE 2.10* 2.00* 1.92*  CALCIUM 9.2  --  8.3*   Liver Function Tests: Recent Labs  Lab 06/03/18 1215  AST 28  ALT 17  ALKPHOS 93  BILITOT 0.5  PROT 7.3  ALBUMIN 3.9   Recent Labs  Lab 06/03/18 1215  LIPASE 40   No results for input(s): AMMONIA in the last 168 hours. CBC: Recent Labs  Lab 06/03/18 1152 06/03/18 1219 06/04/18 0236  WBC 12.5*  --  15.7*  HGB 14.7 16.0 13.2  HCT 48.2 47.0  42.1  MCV 82.0  --  79.7*  PLT 302  --  277   Cardiac Enzymes: Recent Labs  Lab 06/03/18 1635 06/03/18 2316  TROPONINI <0.03 <0.03   BNP: Invalid input(s): POCBNP CBG: Recent Labs  Lab 06/03/18 1226 06/03/18 1745 06/03/18 2117 06/04/18 0730 06/04/18 1312  GLUCAP 138* 154* 191* 175* 212*   D-Dimer Recent Labs    06/03/18 1635  DDIMER 5.10*   Urinalysis    Component Value Date/Time   COLORURINE YELLOW (A) 06/03/2018 1443   APPEARANCEUR CLEAR (A) 06/03/2018 1443   LABSPEC 1.015 06/03/2018 1443   PHURINE 6.0 06/03/2018 1443   GLUCOSEU >1000 (A) 06/03/2018 1443   HGBUR NEGATIVE 06/03/2018 1443   BILIRUBINUR NEGATIVE 06/03/2018 1443   KETONESUR NEGATIVE 06/03/2018 1443   PROTEINUR NEGATIVE 06/03/2018 1443   NITRITE NEGATIVE 06/03/2018 1443   LEUKOCYTESUR NEGATIVE 06/03/2018 1443    Time coordinating discharge: 42 minutes  SIGNED:   Lady Deutscher, MD  FACP [facility] 06/04/2018, 2:58 PM Pager   If 7PM-7AM, please contact night-coverage [email or web address] Password [email or web address]

## 2018-06-04 NOTE — Progress Notes (Signed)
  Echocardiogram 2D Echocardiogram has been performed.  Jennette Dubin 06/04/2018, 12:12 PM

## 2018-06-04 NOTE — Care Management Obs Status (Signed)
[facility] NOTIFICATION   Patient Details  Name: Micheal Morris MRN: [id number] Date of Birth: 03/31/1946   Medicare Observation Status Notification Given:  Yes    Midge Minium RN, BSN, NCM-BC, ACM-RN [phone] 06/04/2018, 3:35 PM

## 2018-06-06 DIAGNOSIS — K5909 Other constipation: Secondary | ICD-10-CM | POA: Insufficient documentation

## 2018-06-09 ENCOUNTER — Encounter (HOSPITAL_BASED_OUTPATIENT_CLINIC_OR_DEPARTMENT_OTHER): Payer: Self-pay | Admitting: Emergency Medicine

## 2018-06-09 ENCOUNTER — Other Ambulatory Visit: Payer: Self-pay

## 2018-06-09 ENCOUNTER — Emergency Department (HOSPITAL_BASED_OUTPATIENT_CLINIC_OR_DEPARTMENT_OTHER)
Admission: EM | Admit: 2018-06-09 | Discharge: 2018-06-09 | Disposition: A | Discharge status: Home / Self Care | Payer: Medicare Other | Attending: Emergency Medicine | Admitting: Emergency Medicine

## 2018-06-09 ENCOUNTER — Emergency Department (HOSPITAL_BASED_OUTPATIENT_CLINIC_OR_DEPARTMENT_OTHER): Payer: Medicare Other

## 2018-06-09 DIAGNOSIS — R112 Nausea with vomiting, unspecified: Secondary | ICD-10-CM | POA: Insufficient documentation

## 2018-06-09 DIAGNOSIS — E1142 Type 2 diabetes mellitus with diabetic polyneuropathy: Secondary | ICD-10-CM | POA: Insufficient documentation

## 2018-06-09 DIAGNOSIS — Z794 Long term (current) use of insulin: Secondary | ICD-10-CM | POA: Diagnosis not present

## 2018-06-09 DIAGNOSIS — I129 Hypertensive chronic kidney disease with stage 1 through stage 4 chronic kidney disease, or unspecified chronic kidney disease: Secondary | ICD-10-CM | POA: Insufficient documentation

## 2018-06-09 DIAGNOSIS — R109 Unspecified abdominal pain: Secondary | ICD-10-CM | POA: Diagnosis present

## 2018-06-09 DIAGNOSIS — E1143 Type 2 diabetes mellitus with diabetic autonomic (poly)neuropathy: Secondary | ICD-10-CM | POA: Diagnosis not present

## 2018-06-09 DIAGNOSIS — Z87891 Personal history of nicotine dependence: Secondary | ICD-10-CM | POA: Diagnosis not present

## 2018-06-09 DIAGNOSIS — Z79899 Other long term (current) drug therapy: Secondary | ICD-10-CM | POA: Insufficient documentation

## 2018-06-09 DIAGNOSIS — E1122 Type 2 diabetes mellitus with diabetic chronic kidney disease: Secondary | ICD-10-CM | POA: Diagnosis not present

## 2018-06-09 DIAGNOSIS — Z7982 Long term (current) use of aspirin: Secondary | ICD-10-CM | POA: Diagnosis not present

## 2018-06-09 DIAGNOSIS — N183 Chronic kidney disease, stage 3 (moderate): Secondary | ICD-10-CM | POA: Insufficient documentation

## 2018-06-09 LAB — URINALYSIS, ROUTINE W REFLEX MICROSCOPIC
BILIRUBIN URINE: NEGATIVE
Glucose, UA: >=500 mg/dL — AB
KETONES UR: 40 mg/dL — AB
Leukocytes, UA: NEGATIVE
NITRITE: NEGATIVE
PH: 5.5 (ref 5.0–8.0)
PROTEIN: 100 mg/dL — AB
Specific Gravity, Urine: 1.025 (ref 1.005–1.030)

## 2018-06-09 LAB — COMPREHENSIVE METABOLIC PANEL
ALK PHOS: 98 U/L (ref 38–126)
ALT: 20 U/L (ref 0–44)
AST: 22 U/L (ref 15–41)
Albumin: 4 g/dL (ref 3.5–5.0)
Anion gap: 16 — ABNORMAL HIGH (ref 5–15)
BILIRUBIN TOTAL: 0.7 mg/dL (ref 0.3–1.2)
BUN: 21 mg/dL (ref 8–23)
CALCIUM: 9.4 mg/dL (ref 8.9–10.3)
CO2: 24 mmol/L (ref 22–32)
CREATININE: 1.38 mg/dL — AB (ref 0.61–1.24)
Chloride: 102 mmol/L (ref 98–111)
GFR, EST AFRICAN AMERICAN: 58 mL/min — AB (ref >60)
GFR, EST NON AFRICAN AMERICAN: 50 mL/min — AB (ref >60)
Glucose, Bld: 212 mg/dL — ABNORMAL HIGH (ref 70–99)
Potassium: 4.7 mmol/L (ref 3.5–5.1)
SODIUM: 142 mmol/L (ref 135–145)
TOTAL PROTEIN: 7.9 g/dL (ref 6.5–8.1)

## 2018-06-09 LAB — URINALYSIS, MICROSCOPIC (REFLEX)

## 2018-06-09 LAB — CBC
HEMATOCRIT: 46.9 % (ref 39.0–52.0)
Hemoglobin: 14.5 g/dL (ref 13.0–17.0)
MCH: 25 pg — ABNORMAL LOW (ref 26.0–34.0)
MCHC: 30.9 g/dL (ref 30.0–36.0)
MCV: 80.9 fL (ref 80.0–100.0)
NRBC: 0 % (ref 0.0–0.2)
PLATELETS: 260 10*3/uL (ref 150–400)
RBC: 5.8 MIL/uL (ref 4.22–5.81)
RDW: 15.8 % — AB (ref 11.5–15.5)
WBC: 16 10*3/uL — AB (ref 4.0–10.5)

## 2018-06-09 LAB — LIPASE, BLOOD: Lipase: 23 U/L (ref 11–51)

## 2018-06-09 MED ORDER — METOCLOPRAMIDE HCL 10 MG PO TABS: 10.0000 mg | ORAL_TABLET | Freq: Four times a day (QID) | ORAL | 0 refills | Status: DC | PRN

## 2018-06-09 MED ORDER — METOCLOPRAMIDE HCL 5 MG/ML IJ SOLN
10.0000 mg | Freq: Once | INTRAMUSCULAR | Status: AC
  Administered 2018-06-09: 10 mg via INTRAVENOUS
  Filled 2018-06-09: qty 2

## 2018-06-09 MED ORDER — LACTATED RINGERS IV SOLN
INTRAVENOUS | Status: DC
  Administered 2018-06-09: 19:00:00 via INTRAVENOUS

## 2018-06-09 MED ORDER — LACTATED RINGERS IV BOLUS
1000.0000 mL | Freq: Once | INTRAVENOUS | Status: AC
  Administered 2018-06-09: 1000 mL via INTRAVENOUS

## 2018-06-09 NOTE — ED Triage Notes (Signed)
abd pain with vomiting since yesterday.

## 2018-06-09 NOTE — ED Notes (Signed)
Attempted to bring to x-ray; provider attempting IV placement.

## 2018-06-09 NOTE — ED Notes (Signed)
ED Provider at bedside. 

## 2018-06-09 NOTE — ED Notes (Signed)
IV attempt x2.  Blood obtained for labs, RN aware, charge RN looking for IV site.  Patient tolerated attempts well.

## 2018-06-09 NOTE — ED Provider Notes (Signed)
[place] EMERGENCY DEPARTMENT Provider Note   CSN: [id number] Arrival date & time: 06/09/18  1523     History   Chief Complaint Chief Complaint  Patient presents with  . Abdominal Pain  . Emesis    HPI Micheal Morris is a 72 y.o. male.  Patient is a 72 year old male with a history of a calculus cholecystitis, syncope, chronic kidney disease, diabetes who is presenting today with nausea vomiting and intermittent abdominal pain since 9 PM last night.  Patient states the nausea started around 9 PM and he started vomiting at 11 PM.  It continued throughout the night and morning.  Patient states he does not have severe abdominal pain but he is sore underneath his ribs in the upper abdomen.  He passed some gas last night but has not had a bowel movement or pass gas today.  He denies any diarrhea.  He has been hot and cold but did not take his temperature.  He did have some Phenergan suppositories at home which his wife stated they used and it slowed the vomiting down but has not stopped it.  He denies any shortness of breath, cough.  Patient was recently hospitalized for the last week but it was after a syncopal event that they determined was most likely vasovagal.  Wife states they evaluated his heart and ensure he did not have a AAA.  Everything looked fine.  Patient has been taking Flexeril and Tylenol for his back which he injured after his syncopal event but has not been on any narcotic pain medication.  The history is provided by the patient and the spouse.  Abdominal Pain   Associated symptoms include vomiting.  Emesis   Associated symptoms include abdominal pain.    Past Medical History:  Diagnosis Date  . Arthritis    "knees" (01/01/2016)  . Chronic kidney disease   . Erectile dysfunction   . HTN (hypertension)   . Hyperlipidemia   . Neuromuscular disorder (HCC)    neuropathy  . Obesity   . OSA on CPAP   . Pain of left upper arm   . Restrictive lung disease    . Sensory hearing loss, bilateral   . SOBOE (shortness of breath on exertion) 11/03/15   currently goes to gym  . Type II diabetes mellitus [facility])     Patient Active Problem List   Diagnosis Date Noted  . 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 Syncope 06/03/2018  . AKI (acute kidney injury) ([place]) 06/03/2018  . Adrenal adenoma 01/02/2016  . Diabetes mellitus with hyperglycemia, with long-term current use of insulin ([place]) 01/02/2016  . CKD (chronic kidney disease) stage 3, GFR 30-59 ml/min (HCC) 01/02/2016  . SIRS (systemic inflammatory response syndrome) ([place]) 01/02/2016  . Urinary tract infectious disease   . Intractable nausea and vomiting 12/31/2015  . Essential hypertension 12/31/2015  . Glaucoma 12/31/2015  . Diabetic peripheral neuropathy ([place]) 12/31/2015  . Hyperlipidemia 12/31/2015  . Diabetic gastroparesis ([place]) 12/31/2015  . OA (osteoarthritis) of knee 11/09/2015  . Morbid obesity ([place]) 04/27/2015  . OSA (obstructive sleep apnea) 09/05/2011  . Diabetes mellitus ([place]) 02/21/2011    Past Surgical History:  Procedure Laterality Date  . COLONOSCOPY    . GLAUCOMA SURGERY Bilateral    "laser"  . JOINT REPLACEMENT    . TOTAL KNEE ARTHROPLASTY Left 11/09/2015   Procedure: LEFT TOTAL KNEE ARTHROPLASTY;  Surgeon: Gaynelle Arabian, MD;  Location: [facility] ORS;  Service: Orthopedics;  Laterality: Left;  Home Medications    Prior to Admission medications   Medication Sig Start Date End Date Taking? Authorizing Provider  amLODipine-benazepril (LOTREL) 10-20 MG capsule Take 1 capsule by mouth daily.  12/27/17   [provider]  aspirin 81 MG chewable tablet Chew 81 mg by mouth daily.    [provider]  bacitracin ointment Apply topically 2 (two) times daily. 06/04/18   Lady Deutscher, MD  BD PEN NEEDLE NANO U/F 32G X 4 MM MISC USE AS DIRECTED 4 TIMES A DAY 03/30/18   [provider]  bimatoprost (LUMIGAN) 0.01 % SOLN Place 1 drop into both eyes at  bedtime.  09/16/14   [provider]  brimonidine-timolol (COMBIGAN) 0.2-0.5 % ophthalmic solution Place 1 drop into both eyes 2 (two) times daily.  04/12/15   [provider]  docusate sodium (COLACE) 100 MG capsule Take 100 mg by mouth 2 (two) times daily.    [provider]  gabapentin (NEURONTIN) 300 MG capsule Take 300 mg by mouth 2 (two) times daily.  10/13/17   [provider]  HUMALOG KWIKPEN 100 UNIT/ML KiwkPen Inject 0-60 Units into the skin 3 (three) times daily. Per sliding scale three times daily with meals 03/13/18   [provider]  JARDIANCE 25 MG TABS tablet Take 25 mg by mouth daily. 02/21/18   [provider]  Lancets Glory Rosebush ULTRASOFT) lancets USE AS INSTRUCTED 06/28/13   [provider]  LANTUS SOLOSTAR 100 UNIT/ML Solostar Pen Inject 75 Units into the skin daily.  03/06/18   [provider]  linaclotide (LINZESS) 290 MCG CAPS capsule Take 290 mcg by mouth daily.  03/20/18   [provider]  liraglutide (VICTOZA) 18 MG/3ML SOPN Inject 0.3 mLs (1.8 mg total) into the skin daily. 06/04/18   Lady Deutscher, MD  magic mouthwash w/lidocaine SOLN Take 10 mLs by mouth 4 (four) times daily for 5 days. 06/04/18 06/09/18  Lady Deutscher, MD  metoprolol succinate (TOPROL-XL) 100 MG 24 hr tablet Take 100 mg by mouth daily.  01/30/18   [provider]  Multiple Vitamin (MULTIVITAMIN) tablet Take 1 tablet by mouth daily. Centrum silver    [provider]  MYRBETRIQ 50 MG TB24 tablet Take 50 mg by mouth daily.  05/19/18   [provider]  ONE TOUCH ULTRA TEST test strip  03/12/18   [provider]  pantoprazole (PROTONIX) 40 MG tablet Take 40 mg by mouth daily.  04/30/18   [provider]  Probiotic Product ([place]) CAPS Take 1 capsule by mouth daily.    [provider]  RHOPRESSA 0.02 % SOLN Place 1 drop into both eyes every evening.  05/16/18    [provider]  simvastatin (ZOCOR) 20 MG tablet Take 20 mg by mouth daily at 6 PM.  03/06/18   [provider]  spironolactone (ALDACTONE) 25 MG tablet Take 25 mg by mouth daily.  01/30/18   [provider]  torsemide (DEMADEX) 20 MG tablet Take 20 mg by mouth daily.  04/20/18   [provider]  vardenafil (LEVITRA) 20 MG tablet Take 20 mg by mouth as needed for erectile dysfunction.  08/07/12   [provider]    Family History Family History  Problem Relation Age of Onset  . Stroke Mother   . Diabetes Mother   . Hypertension Mother   . Hypertension Brother   . Diabetes Brother   . Diabetes Sister     Social  History Social History   Tobacco Use  . Smoking status: Former Smoker    Packs/day: 1.00    Years: 7.00    Pack years: 7.00    Types: Cigarettes    Last attempt to quit: 08/01/1974    Years since quitting: 43.8  . Smokeless tobacco: Never Used  Substance Use Topics  . Alcohol use: No  . Drug use: No     Allergies   Nsaids and Aspirin   Review of Systems Review of Systems  Gastrointestinal: Positive for abdominal pain and vomiting.  All other systems reviewed and are negative.    Physical Exam Updated Vital Signs BP (!) 162/82 (BP Location: Right Arm)   Pulse (!) 103   Temp 97.7 F (36.5 C) (Oral)   Resp 18   Ht 5\' 10"  (1.778 m)   Wt 124.7 kg   SpO2 100%   BMI 39.46 kg/m   Physical Exam  Constitutional: He is oriented to person, place, and time. He appears well-developed and well-nourished. No distress.  HENT:  Head: Normocephalic and atraumatic.  Mouth/Throat: Oropharynx is clear and moist.  Dry mucous membranes  Eyes: Pupils are equal, round, and reactive to light. Conjunctivae and EOM are normal.  Neck: Normal range of motion. Neck supple.  Cardiovascular: Regular rhythm and intact distal pulses. Tachycardia present.  No murmur heard. Pulmonary/Chest: Effort normal and breath sounds normal. No  respiratory distress. He has no wheezes. He has no rales.  Abdominal: Soft. He exhibits no distension. Bowel sounds are decreased. There is tenderness in the right upper quadrant, epigastric area and left upper quadrant. There is no rebound, no guarding, no CVA tenderness and negative Murphy's sign. No hernia.  Musculoskeletal: Normal range of motion. He exhibits no edema or tenderness.  Neurological: He is alert and oriented to person, place, and time.  Skin: Skin is warm and dry. No rash noted. No erythema.  Psychiatric: He has a normal mood and affect. His behavior is normal.  Nursing note and vitals reviewed.    ED Treatments / Results  Labs (all labs ordered are listed, but only abnormal results are displayed) Labs Reviewed  COMPREHENSIVE METABOLIC PANEL - Abnormal; Notable for the following components:      Result Value   Glucose, Bld 212 (*)    Creatinine, Ser 1.38 (*)    GFR calc non Af Amer 50 (*)    GFR calc Af Amer 58 (*)    Anion gap 16 (*)    All other components within normal limits  CBC - Abnormal; Notable for the following components:   WBC 16.0 (*)    MCH 25.0 (*)    RDW 15.8 (*)    All other components within normal limits  URINALYSIS, ROUTINE W REFLEX MICROSCOPIC - Abnormal; Notable for the following components:   Glucose, UA >=500 (*)    Hgb urine dipstick SMALL (*)    Ketones, ur 40 (*)    Protein, ur 100 (*)    All other components within normal limits  URINALYSIS, MICROSCOPIC (REFLEX) - Abnormal; Notable for the following components:   Bacteria, UA MANY (*)    All other components within normal limits  LIPASE, BLOOD    EKG None  Radiology Dg Abdomen 1 View  Result Date: 06/09/2018 CLINICAL DATA:  Patient with nausea and vomiting. EXAM: ABDOMEN - 1 VIEW COMPARISON:  CT abdomen pelvis 06/03/2018 FINDINGS: Large amount of stool within the cecum, ascending and transverse colon. Nonobstructed bowel gas pattern. Bibasilar heterogeneous  opacities favored  represent atelectasis. Osseous structures unremarkable. IMPRESSION: Stool throughout the colon as can be seen with constipation. Nonobstructed bowel gas pattern. Electronically Signed   By: Lovey Newcomer M.D.   On: 06/09/2018 18:11    Procedures Procedures (including critical care time)  Medications Ordered in ED Medications  lactated ringers bolus 1,000 mL (has no administration in time range)  lactated ringers infusion (has no administration in time range)  metoCLOPramide (REGLAN) injection 10 mg (has no administration in time range)     Initial Impression / Assessment and Plan / ED Course  I have reviewed the triage vital signs and the nursing notes.  Pertinent labs & imaging results that were available during my care of the patient were reviewed by me and considered in my medical decision making (see chart for details).     Elderly male with multiple medical problems presenting today with nausea and vomiting unable to hold anything down since approximately 9:00 last night.  Wife states this is happened once before and it was from a nonfunctioning gallbladder.  He has never had a cholecystectomy.  Patient has upper abdominal pain on exam but no rebound or guarding.  Concern for possible SBO, recurrent acalculous cholecystitis, viral etiology, peptic ulcer disease.  Patient has not had any hematemesis or melena.  He has not had any diarrhea.  He is afebrile here but tachycardic and does appear dehydrated.  CBC, CMP, lipase, UA, KUB pending.  Patient given IV fluids and antiemetic.  5:59 PM Pt's labs with normal lipase and LFT's.  Mild dehydration with anion gap of 16.  Leukocytosis is 16,000 however unclear of significance as pt seems to have leukocytosis chronically.  9:04 PM Patient's UA with evidence of ketones but no acute infection.  After fluids patient states he is feeling better.  He is having some trouble getting comfortable in the bed because he states he has had back pain since  his syncopal event.  He has had no further vomiting and is tolerating p.o.'s.  Findings discussed with the patient and his family.  KUB without evidence of obstruction but generalized stool throughout.  Patient would like to go home.  Patient given a prescription for Reglan.  Cautioned any worsening symptoms to return immediately.  Is able to ambulate here states he felt generally weak but otherwise was feeling better.  Final Clinical Impressions(s) / ED Diagnoses   Final diagnoses:  Intractable vomiting with nausea, unspecified vomiting type    ED Discharge Orders         Ordered    metoCLOPramide (REGLAN) 10 MG tablet  Every 6 hours PRN     06/09/18 2043           Blanchie Dessert, MD 06/09/18 2105

## 2018-06-09 NOTE — Discharge Instructions (Signed)
If symptoms worsen or you develop fever, severe abdominal pain, chest pain or shortness of breath please return

## 2018-06-09 NOTE — ED Notes (Addendum)
Attempted IV x 2 , tol well, unsuccessful , ED MD informed and will attempt with U/S

## 2018-07-31 ENCOUNTER — Encounter: Payer: Self-pay | Admitting: Podiatry

## 2018-07-31 ENCOUNTER — Ambulatory Visit: Payer: Medicare Other | Admitting: Podiatry

## 2018-07-31 DIAGNOSIS — B351 Tinea unguium: Secondary | ICD-10-CM

## 2018-07-31 DIAGNOSIS — M79676 Pain in unspecified toe(s): Secondary | ICD-10-CM | POA: Diagnosis not present

## 2018-07-31 DIAGNOSIS — L84 Corns and callosities: Secondary | ICD-10-CM | POA: Diagnosis not present

## 2018-07-31 DIAGNOSIS — E1142 Type 2 diabetes mellitus with diabetic polyneuropathy: Secondary | ICD-10-CM | POA: Diagnosis not present

## 2018-07-31 NOTE — Progress Notes (Signed)
Patient ID: Micheal Morris, male   DOB: 1946/02/19, 72 y.o.   MRN: [id number] Complaint:  Visit Type: Patient returns to my office for continued preventative foot care services. Complaint: Patient states" my nails have grown long and thick and become painful to walk and wear shoes" Patient has been diagnosed with DM with  neuropathy.. The patient presents for preventative foot care services. No changes to ROS.  Patient has healing blood blister fifth toenail left foot.  Podiatric Exam: Vascular: dorsalis pedis and posterior tibial pulses are palpable bilateral. Capillary return is immediate. Temperature gradient is WNL. Skin turgor WNL  Sensorium: Normal Semmes Weinstein monofilament test. Normal tactile sensation bilaterally. Nail Exam: Pt has thick disfigured discolored nails with subungual debris noted bilateral entire nail hallux through fifth toenails Ulcer Exam: There is no evidence of ulcer or pre-ulcerative changes or infection. Orthopedic Exam: Muscle tone and strength are WNL. No limitations in general ROM. No crepitus or effusions noted. Foot type and digits show no abnormalities. Bony prominences are unremarkable. Skin: No Porokeratosis. No infection or ulcers.  . Hemmorrhagic callus right hallux.  Diagnosis:  Onychomycosis, , Pain in right toe, pain in left toes Callus right hallux  Treatment & Plan Procedures and Treatment: Consent by patient was obtained for treatment procedures. The patient understood the discussion of treatment and procedures well. All questions were answered thoroughly reviewed. Debridement of mycotic and hypertrophic toenails, 1 through 5 bilateral and clearing of subungual debris. No ulceration, no infection noted. Debride callus right hallux.   DSD applied to healing blood blister fifth toe subungually. Return Visit-Office Procedure: Patient instructed to return to the office for a follow up visit 10 weeks  for continued evaluation and treatment.  Gardiner Barefoot DPM

## 2018-09-17 DIAGNOSIS — K828 Other specified diseases of gallbladder: Secondary | ICD-10-CM | POA: Insufficient documentation

## 2018-09-17 HISTORY — PX: LAPAROSCOPIC CHOLECYSTECTOMY: SUR755

## 2018-10-03 ENCOUNTER — Encounter: Payer: Self-pay | Admitting: Podiatry

## 2018-10-03 ENCOUNTER — Ambulatory Visit: Payer: Medicare Other | Admitting: Podiatry

## 2018-10-03 DIAGNOSIS — B351 Tinea unguium: Secondary | ICD-10-CM | POA: Diagnosis not present

## 2018-10-03 DIAGNOSIS — E1142 Type 2 diabetes mellitus with diabetic polyneuropathy: Secondary | ICD-10-CM | POA: Diagnosis not present

## 2018-10-03 DIAGNOSIS — M79676 Pain in unspecified toe(s): Secondary | ICD-10-CM | POA: Diagnosis not present

## 2018-10-03 NOTE — Progress Notes (Signed)
Patient ID: Micheal Morris, male   DOB: [birth year], 73 y.o.   MRN: [id number] Complaint:  Visit Type: Patient returns to my office for continued preventative foot care services. Complaint: Patient states" my nails have grown long and thick and become painful to walk and wear shoes" Patient has been diagnosed with DM with  neuropathy.. The patient presents for preventative foot care services. No changes to ROS.  Patient has healing blood blister fifth toenail left foot.  Podiatric Exam: Vascular: dorsalis pedis and posterior tibial pulses are palpable bilateral. Capillary return is immediate. Temperature gradient is WNL. Skin turgor WNL  Sensorium: Normal Semmes Weinstein monofilament test. Normal tactile sensation bilaterally. Nail Exam: Pt has thick disfigured discolored nails with subungual debris noted bilateral entire nail hallux through fifth toenails Ulcer Exam: There is no evidence of ulcer or pre-ulcerative changes or infection. Orthopedic Exam: Muscle tone and strength are WNL. No limitations in general ROM. No crepitus or effusions noted. Foot type and digits show no abnormalities. Bony prominences are unremarkable. Skin: No Porokeratosis. No infection or ulcers.  . Asymptomatic callus right hallux  Distally.  Diagnosis:  Onychomycosis, , Pain in right toe, pain in left toes  Treatment & Plan Procedures and Treatment: Consent by patient was obtained for treatment procedures. The patient understood the discussion of treatment and procedures well. All questions were answered thoroughly reviewed. Debridement of mycotic and hypertrophic toenails, 1 through 5 bilateral and clearing of subungual debris. No ulceration, no infection noted.  Return Visit-Office Procedure: Patient instructed to return to the office for a follow up visit 9   weeks  for continued evaluation and treatment.  Gardiner Barefoot DPM

## 2018-12-05 ENCOUNTER — Other Ambulatory Visit: Payer: Self-pay

## 2018-12-05 ENCOUNTER — Ambulatory Visit: Payer: Medicare Other | Admitting: Podiatry

## 2018-12-05 ENCOUNTER — Encounter: Payer: Self-pay | Admitting: Podiatry

## 2018-12-05 VITALS — Temp 97.9°F

## 2018-12-05 DIAGNOSIS — E1142 Type 2 diabetes mellitus with diabetic polyneuropathy: Secondary | ICD-10-CM | POA: Diagnosis not present

## 2018-12-05 DIAGNOSIS — M79676 Pain in unspecified toe(s): Secondary | ICD-10-CM

## 2018-12-05 DIAGNOSIS — B351 Tinea unguium: Secondary | ICD-10-CM

## 2018-12-05 NOTE — Progress Notes (Signed)
Patient ID: ZI [place], male   DOB: 1946/01/31, 73 y.o.   MRN: [id number] Complaint:  Visit Type: Patient returns to my office for continued preventative foot care services. Complaint: Patient states" my nails have grown long and thick and become painful to walk and wear shoes" Patient has been diagnosed with DM with  neuropathy.. The patient presents for preventative foot care services. No changes to ROS.    Podiatric Exam: Vascular: dorsalis pedis and posterior tibial pulses are palpable bilateral. Capillary return is immediate. Temperature gradient is WNL. Skin turgor WNL  Sensorium: Normal Semmes Weinstein monofilament test. Normal tactile sensation bilaterally. Nail Exam: Pt has thick disfigured discolored nails with subungual debris noted bilateral entire nail hallux through fifth toenails Ulcer Exam: There is no evidence of ulcer or pre-ulcerative changes or infection. Orthopedic Exam: Muscle tone and strength are WNL. No limitations in general ROM. No crepitus or effusions noted. Foot type and digits show no abnormalities. Bony prominences are unremarkable. Skin: No Porokeratosis. No infection or ulcers.  . Asymptomatic callus right hallux  Distally.  Diagnosis:  Onychomycosis, , Pain in right toe, pain in left toes  Treatment & Plan Procedures and Treatment: Consent by patient was obtained for treatment procedures. The patient understood the discussion of treatment and procedures well. All questions were answered thoroughly reviewed. Debridement of mycotic and hypertrophic toenails, 1 through 5 bilateral and clearing of subungual debris. No ulceration, no infection noted.  Return Visit-Office Procedure: Patient instructed to return to the office for a follow up visit 9   weeks  for continued evaluation and treatment.  Gardiner Barefoot DPM

## 2019-02-06 ENCOUNTER — Other Ambulatory Visit: Payer: Self-pay

## 2019-02-06 ENCOUNTER — Ambulatory Visit (INDEPENDENT_AMBULATORY_CARE_PROVIDER_SITE_OTHER): Payer: Medicare Other | Admitting: Podiatry

## 2019-02-06 ENCOUNTER — Encounter: Payer: Self-pay | Admitting: Podiatry

## 2019-02-06 DIAGNOSIS — M79674 Pain in right toe(s): Secondary | ICD-10-CM

## 2019-02-06 DIAGNOSIS — M79675 Pain in left toe(s): Secondary | ICD-10-CM

## 2019-02-06 DIAGNOSIS — B351 Tinea unguium: Secondary | ICD-10-CM | POA: Diagnosis not present

## 2019-02-06 DIAGNOSIS — E119 Type 2 diabetes mellitus without complications: Secondary | ICD-10-CM | POA: Diagnosis not present

## 2019-02-06 NOTE — Progress Notes (Signed)
Patient ID: Micheal Morris, male   DOB: [birth year], 73 y.o.   MRN: [id number] Complaint:  Visit Type: Patient returns to my office for continued preventative foot care services. Complaint: Patient states" my nails have grown long and thick and become painful to walk and wear shoes" Patient has been diagnosed with DM with  neuropathy.. The patient presents for preventative foot care services. No changes to ROS.    Podiatric Exam: Vascular: dorsalis pedis and posterior tibial pulses are palpable bilateral. Capillary return is immediate. Temperature gradient is WNL. Skin turgor WNL  Sensorium: Normal Semmes Weinstein monofilament test. Normal tactile sensation bilaterally. Nail Exam: Pt has thick disfigured discolored nails with subungual debris noted bilateral entire nail hallux through fifth toenails Ulcer Exam: There is no evidence of ulcer or pre-ulcerative changes or infection. Orthopedic Exam: Muscle tone and strength are WNL. No limitations in general ROM. No crepitus or effusions noted. Foot type and digits show no abnormalities. Bony prominences are unremarkable. Skin: No Porokeratosis. No infection or ulcers.  . Asymptomatic callus right hallux  Distally.  Diagnosis:  Onychomycosis, , Pain in right toe, pain in left toes  Treatment & Plan Procedures and Treatment: Consent by patient was obtained for treatment procedures. The patient understood the discussion of treatment and procedures well. All questions were answered thoroughly reviewed. Debridement of mycotic and hypertrophic toenails, 1 through 5 bilateral and clearing of subungual debris. No ulceration, no infection noted.  Return Visit-Office Procedure: Patient instructed to return to the office for a follow up visit 9   weeks  for continued evaluation and treatment.  Gardiner Barefoot DPM

## 2019-04-10 ENCOUNTER — Encounter: Payer: Self-pay | Admitting: Podiatry

## 2019-04-10 ENCOUNTER — Other Ambulatory Visit: Payer: Self-pay

## 2019-04-10 ENCOUNTER — Ambulatory Visit (INDEPENDENT_AMBULATORY_CARE_PROVIDER_SITE_OTHER): Payer: Medicare Other | Admitting: Podiatry

## 2019-04-10 DIAGNOSIS — B351 Tinea unguium: Secondary | ICD-10-CM | POA: Diagnosis not present

## 2019-04-10 DIAGNOSIS — M79675 Pain in left toe(s): Secondary | ICD-10-CM | POA: Diagnosis not present

## 2019-04-10 DIAGNOSIS — M79674 Pain in right toe(s): Secondary | ICD-10-CM | POA: Diagnosis not present

## 2019-04-10 DIAGNOSIS — E119 Type 2 diabetes mellitus without complications: Secondary | ICD-10-CM

## 2019-04-10 NOTE — Progress Notes (Signed)
Patient ID: ISSA SUNIGA, male   DOB: 11/16/1945, 73 y.o.   MRN: [id number] Complaint:  Visit Type: Patient returns to my office for continued preventative foot care services. Complaint: Patient states" my nails have grown long and thick and become painful to walk and wear shoes" Patient has been diagnosed with DM with  neuropathy.. The patient presents for preventative foot care services. No changes to ROS.    Podiatric Exam: Vascular: dorsalis pedis and posterior tibial pulses are palpable bilateral. Capillary return is immediate. Temperature gradient is WNL. Skin turgor WNL  Sensorium: Normal Semmes Weinstein monofilament test. Normal tactile sensation bilaterally. Nail Exam: Pt has thick disfigured discolored nails with subungual debris noted bilateral entire nail hallux through fifth toenails Ulcer Exam: There is no evidence of ulcer or pre-ulcerative changes or infection. Orthopedic Exam: Muscle tone and strength are WNL. No limitations in general ROM. No crepitus or effusions noted. Foot type and digits show no abnormalities. Bony prominences are unremarkable. Skin: No Porokeratosis. No infection or ulcers.  . Asymptomatic callus right hallux  Distally.  Diagnosis:  Onychomycosis, , Pain in right toe, pain in left toes  Treatment & Plan Procedures and Treatment: Consent by patient was obtained for treatment procedures. The patient understood the discussion of treatment and procedures well. All questions were answered thoroughly reviewed. Debridement of mycotic and hypertrophic toenails, 1 through 5 bilateral and clearing of subungual debris. No ulceration, no infection noted.  Return Visit-Office Procedure: Patient instructed to return to the office for a follow up visit 9   weeks  for continued evaluation and treatment.  Gardiner Barefoot DPM

## 2019-06-12 ENCOUNTER — Ambulatory Visit: Payer: Medicare Other | Admitting: Podiatry

## 2019-06-12 ENCOUNTER — Other Ambulatory Visit: Payer: Self-pay

## 2019-06-12 ENCOUNTER — Encounter: Payer: Self-pay | Admitting: Podiatry

## 2019-06-12 DIAGNOSIS — M79674 Pain in right toe(s): Secondary | ICD-10-CM | POA: Diagnosis not present

## 2019-06-12 DIAGNOSIS — E119 Type 2 diabetes mellitus without complications: Secondary | ICD-10-CM | POA: Diagnosis not present

## 2019-06-12 DIAGNOSIS — M79675 Pain in left toe(s): Secondary | ICD-10-CM

## 2019-06-12 DIAGNOSIS — B351 Tinea unguium: Secondary | ICD-10-CM | POA: Diagnosis not present

## 2019-06-12 NOTE — Progress Notes (Signed)
Patient ID: Micheal Morris, male   DOB: 1946-02-04, 73 y.o.   MRN: [id number] Complaint:  Visit Type: Patient returns to my office for continued preventative foot care services. Complaint: Patient states" my nails have grown long and thick and become painful to walk and wear shoes" Patient has been diagnosed with DM with  neuropathy.. The patient presents for preventative foot care services. No changes to ROS.    Podiatric Exam: Vascular: dorsalis pedis and posterior tibial pulses are palpable bilateral. Capillary return is immediate. Temperature gradient is WNL. Skin turgor WNL  Sensorium: Normal Semmes Weinstein monofilament test. Normal tactile sensation bilaterally. Nail Exam: Pt has thick disfigured discolored nails with subungual debris noted bilateral entire nail hallux through fifth toenails Ulcer Exam: There is no evidence of ulcer or pre-ulcerative changes or infection. Orthopedic Exam: Muscle tone and strength are WNL. No limitations in general ROM. No crepitus or effusions noted. Foot type and digits show no abnormalities. Bony prominences are unremarkable. Skin: No Porokeratosis. No infection or ulcers.  .  Diagnosis:  Onychomycosis, , Pain in right toe, pain in left toes  Treatment & Plan Procedures and Treatment: Consent by patient was obtained for treatment procedures. The patient understood the discussion of treatment and procedures well. All questions were answered thoroughly reviewed. Debridement of mycotic and hypertrophic toenails, 1 through 5 bilateral and clearing of subungual debris. No ulceration, no infection noted.  Return Visit-Office Procedure: Patient instructed to return to the office for a follow up visit 9   weeks  for continued evaluation and treatment.  Gardiner Barefoot DPM

## 2019-08-14 ENCOUNTER — Other Ambulatory Visit: Payer: Self-pay

## 2019-08-14 ENCOUNTER — Ambulatory Visit: Payer: Medicare Other | Admitting: Podiatry

## 2019-08-14 ENCOUNTER — Encounter: Payer: Self-pay | Admitting: Podiatry

## 2019-08-14 DIAGNOSIS — M79675 Pain in left toe(s): Secondary | ICD-10-CM | POA: Diagnosis not present

## 2019-08-14 DIAGNOSIS — M79674 Pain in right toe(s): Secondary | ICD-10-CM

## 2019-08-14 DIAGNOSIS — E119 Type 2 diabetes mellitus without complications: Secondary | ICD-10-CM

## 2019-08-14 DIAGNOSIS — B351 Tinea unguium: Secondary | ICD-10-CM

## 2019-08-14 NOTE — Progress Notes (Signed)
Patient ID: Micheal Morris, male   DOB: 1946-02-04, 74 y.o.   MRN: [id number] Complaint:  Visit Type: Patient returns to my office for continued preventative foot care services. Complaint: Patient states" my nails have grown long and thick and become painful to walk and wear shoes" Patient has been diagnosed with DM with  neuropathy.. The patient presents for preventative foot care services. No changes to ROS.    Podiatric Exam: Vascular: dorsalis pedis and posterior tibial pulses are palpable bilateral. Capillary return is immediate. Temperature gradient is WNL. Skin turgor WNL  Sensorium: Normal Semmes Weinstein monofilament test. Normal tactile sensation bilaterally. Nail Exam: Pt has thick disfigured discolored nails with subungual debris noted bilateral entire nail hallux through fifth toenails Ulcer Exam: There is no evidence of ulcer or pre-ulcerative changes or infection. Orthopedic Exam: Muscle tone and strength are WNL. No limitations in general ROM. No crepitus or effusions noted. Foot type and digits show no abnormalities. Bony prominences are unremarkable. Skin: No Porokeratosis. No infection or ulcers.  .  Diagnosis:  Onychomycosis, , Pain in right toe, pain in left toes  Treatment & Plan Procedures and Treatment: Consent by patient was obtained for treatment procedures. The patient understood the discussion of treatment and procedures well. All questions were answered thoroughly reviewed. Debridement of mycotic and hypertrophic toenails, 1 through 5 bilateral and clearing of subungual debris. No ulceration, no infection noted.  Return Visit-Office Procedure: Patient instructed to return to the office for a follow up visit 9   weeks  for continued evaluation and treatment.  Gardiner Barefoot DPM

## 2019-08-25 ENCOUNTER — Emergency Department (HOSPITAL_COMMUNITY)
Admission: EM | Admit: 2019-08-25 | Discharge: 2019-08-25 | Disposition: A | Discharge status: Home / Self Care | Payer: Medicare PPO | Attending: Emergency Medicine | Admitting: Emergency Medicine

## 2019-08-25 ENCOUNTER — Emergency Department (HOSPITAL_COMMUNITY): Payer: Medicare PPO

## 2019-08-25 ENCOUNTER — Other Ambulatory Visit: Payer: Self-pay

## 2019-08-25 ENCOUNTER — Encounter (HOSPITAL_COMMUNITY): Payer: Self-pay | Admitting: Emergency Medicine

## 2019-08-25 DIAGNOSIS — E1122 Type 2 diabetes mellitus with diabetic chronic kidney disease: Secondary | ICD-10-CM | POA: Insufficient documentation

## 2019-08-25 DIAGNOSIS — Z794 Long term (current) use of insulin: Secondary | ICD-10-CM | POA: Insufficient documentation

## 2019-08-25 DIAGNOSIS — Z87891 Personal history of nicotine dependence: Secondary | ICD-10-CM | POA: Diagnosis not present

## 2019-08-25 DIAGNOSIS — Z7982 Long term (current) use of aspirin: Secondary | ICD-10-CM | POA: Diagnosis not present

## 2019-08-25 DIAGNOSIS — N183 Chronic kidney disease, stage 3 unspecified: Secondary | ICD-10-CM | POA: Insufficient documentation

## 2019-08-25 DIAGNOSIS — Z96652 Presence of left artificial knee joint: Secondary | ICD-10-CM | POA: Diagnosis not present

## 2019-08-25 DIAGNOSIS — R55 Syncope and collapse: Secondary | ICD-10-CM | POA: Diagnosis present

## 2019-08-25 DIAGNOSIS — Z79899 Other long term (current) drug therapy: Secondary | ICD-10-CM | POA: Insufficient documentation

## 2019-08-25 DIAGNOSIS — I509 Heart failure, unspecified: Secondary | ICD-10-CM | POA: Diagnosis not present

## 2019-08-25 DIAGNOSIS — E860 Dehydration: Secondary | ICD-10-CM | POA: Diagnosis not present

## 2019-08-25 DIAGNOSIS — I130 Hypertensive heart and chronic kidney disease with heart failure and stage 1 through stage 4 chronic kidney disease, or unspecified chronic kidney disease: Secondary | ICD-10-CM | POA: Diagnosis not present

## 2019-08-25 DIAGNOSIS — E11649 Type 2 diabetes mellitus with hypoglycemia without coma: Secondary | ICD-10-CM | POA: Diagnosis not present

## 2019-08-25 DIAGNOSIS — E162 Hypoglycemia, unspecified: Secondary | ICD-10-CM

## 2019-08-25 HISTORY — DX: Heart failure, unspecified: I50.9

## 2019-08-25 LAB — CBC WITH DIFFERENTIAL/PLATELET
Abs Immature Granulocytes: 0.07 10*3/uL (ref 0.00–0.07)
Basophils Absolute: 0.1 10*3/uL (ref 0.0–0.1)
Basophils Relative: 1 %
Eosinophils Absolute: 0.2 10*3/uL (ref 0.0–0.5)
Eosinophils Relative: 2 %
HCT: 47 % (ref 39.0–52.0)
Hemoglobin: 14.5 g/dL (ref 13.0–17.0)
Immature Granulocytes: 1 %
Lymphocytes Relative: 18 %
Lymphs Abs: 1.8 10*3/uL (ref 0.7–4.0)
MCH: 25.6 pg — ABNORMAL LOW (ref 26.0–34.0)
MCHC: 30.9 g/dL (ref 30.0–36.0)
MCV: 82.9 fL (ref 80.0–100.0)
Monocytes Absolute: 1.5 10*3/uL — ABNORMAL HIGH (ref 0.1–1.0)
Monocytes Relative: 15 %
Neutro Abs: 6.6 10*3/uL (ref 1.7–7.7)
Neutrophils Relative %: 63 %
Platelets: 213 10*3/uL (ref 150–400)
RBC: 5.67 MIL/uL (ref 4.22–5.81)
RDW: 15.9 % — ABNORMAL HIGH (ref 11.5–15.5)
WBC: 10.3 10*3/uL (ref 4.0–10.5)
nRBC: 0 % (ref 0.0–0.2)

## 2019-08-25 LAB — COMPREHENSIVE METABOLIC PANEL
ALT: 22 U/L (ref 0–44)
AST: 66 U/L — ABNORMAL HIGH (ref 15–41)
Albumin: 3.4 g/dL — ABNORMAL LOW (ref 3.5–5.0)
Alkaline Phosphatase: 84 U/L (ref 38–126)
Anion gap: 8 (ref 5–15)
BUN: 34 mg/dL — ABNORMAL HIGH (ref 8–23)
CO2: 21 mmol/L — ABNORMAL LOW (ref 22–32)
Calcium: 8.5 mg/dL — ABNORMAL LOW (ref 8.9–10.3)
Chloride: 108 mmol/L (ref 98–111)
Creatinine, Ser: 2.16 mg/dL — ABNORMAL HIGH (ref 0.61–1.24)
GFR calc Af Amer: 34 mL/min — ABNORMAL LOW (ref >60)
GFR calc non Af Amer: 30 mL/min — ABNORMAL LOW (ref >60)
Glucose, Bld: 67 mg/dL — ABNORMAL LOW (ref 70–99)
Potassium: 5.9 mmol/L — ABNORMAL HIGH (ref 3.5–5.1)
Sodium: 137 mmol/L (ref 135–145)
Total Bilirubin: 0.8 mg/dL (ref 0.3–1.2)
Total Protein: 6.6 g/dL (ref 6.5–8.1)

## 2019-08-25 LAB — URINALYSIS, ROUTINE W REFLEX MICROSCOPIC
Bacteria, UA: NONE SEEN
Bilirubin Urine: NEGATIVE
Glucose, UA: >=500 mg/dL — AB
Hgb urine dipstick: NEGATIVE
Ketones, ur: NEGATIVE mg/dL
Nitrite: NEGATIVE
Protein, ur: NEGATIVE mg/dL
Specific Gravity, Urine: 1.008 (ref 1.005–1.030)
pH: 5 (ref 5.0–8.0)

## 2019-08-25 LAB — POTASSIUM: Potassium: 3.4 mmol/L — ABNORMAL LOW (ref 3.5–5.1)

## 2019-08-25 LAB — CBG MONITORING, ED
Glucose-Capillary: 58 mg/dL — ABNORMAL LOW (ref 70–99)
Glucose-Capillary: 63 mg/dL — ABNORMAL LOW (ref 70–99)
Glucose-Capillary: 125 mg/dL — ABNORMAL HIGH (ref 70–99)
Glucose-Capillary: 114 mg/dL — ABNORMAL HIGH (ref 70–99)
Glucose-Capillary: 106 mg/dL — ABNORMAL HIGH (ref 70–99)

## 2019-08-25 LAB — TROPONIN I (HIGH SENSITIVITY)
Troponin I (High Sensitivity): 4 ng/L (ref <18)
Troponin I (High Sensitivity): 3 ng/L (ref <18)

## 2019-08-25 MED ORDER — SODIUM CHLORIDE 0.9 % IV BOLUS
500.0000 mL | Freq: Once | INTRAVENOUS | Status: AC
  Administered 2019-08-25: 500 mL via INTRAVENOUS

## 2019-08-25 MED ORDER — DEXTROSE 50 % IV SOLN
1.0000 | Freq: Once | INTRAVENOUS | Status: AC
  Administered 2019-08-25: 14:00:00 50 mL via INTRAVENOUS
  Filled 2019-08-25: qty 50

## 2019-08-25 NOTE — ED Notes (Signed)
CBG 61mg /dL, 4 oz OJ provided to pt. A&Ox4 during this event.

## 2019-08-25 NOTE — ED Provider Notes (Signed)
[facility] Provider Note   CSN: [id number] Arrival date & time: 08/25/19  1341     History Chief Complaint  Patient presents with  . Loss of Consciousness    Micheal Morris is a 74 y.o. male presenting for evaluation of syncope.  Patient states just prior to arrival he got up from his chair and started to walk to the bathroom when he had a syncopal event. Next thing he knows his family tried to wake him up.  Per EMS note, immediately after hearing the fall, family found patient semiresponsive for about 30 seconds before becoming fully alert.  Patient states since the episode, he has been feeling at his baseline.  He denies headache, vision changes, dizziness, lightheadedness, neck pain, back pain, chest pain, shortness of breath, nausea, vomiting, abd pain, change in urination, or abnormal bowel movements.  Patient states he had a similar event in 2019, was admitted at that time without significant findings.  Patient states his blood sugar this morning was slightly lower than normal.  After breakfast, blood sugar was around 120, normally is 200+ after breakfast.  He did take his insulin after breakfast, but at a lower dose due to the lower BGL.  He has not had anything to eat since breakfast.  HPI     Past Medical History:  Diagnosis Date  . Arthritis    "knees" (01/01/2016)  . CHF (congestive heart failure) ([place])   . Chronic kidney disease   . Erectile dysfunction   . HTN (hypertension)   . Hyperlipidemia   . Neuromuscular disorder (HCC)    neuropathy  . Obesity   . OSA on CPAP   . Pain of left upper arm   . Restrictive lung disease   . Sensory hearing loss, bilateral   . SOBOE (shortness of breath on exertion) 11/03/15   currently goes to gym  . Type II diabetes mellitus [facility])     Patient Active Problem List   Diagnosis Date Noted  . Pain due to onychomycosis of toenails of both feet 02/06/2019  . Diabetes mellitus without  complication ([place]) [id number]  . Biliary dyskinesia 09/17/2018  . Constipation, chronic 06/06/2018  . 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 Syncope 06/03/2018  . AKI (acute kidney injury) ([place]) 06/03/2018  . Adrenal adenoma 01/02/2016  . Diabetes mellitus with hyperglycemia, with long-term current use of insulin ([place]) 01/02/2016  . CKD (chronic kidney disease) stage 3, GFR 30-59 ml/min (HCC) 01/02/2016  . SIRS (systemic inflammatory response syndrome) ([place]) 01/02/2016  . Urinary tract infectious disease   . Intractable nausea and vomiting 12/31/2015  . Essential hypertension 12/31/2015  . Glaucoma 12/31/2015  . Diabetic peripheral neuropathy ([place]) 12/31/2015  . Hyperlipidemia 12/31/2015  . Diabetic gastroparesis ([place]) 12/31/2015  . OA (osteoarthritis) of knee 11/09/2015  . Morbid obesity ([place]) 04/27/2015  . Elevated creatine kinase 09/18/2012  . GERD (gastroesophageal reflux disease) 10/10/2011  . OSA (obstructive sleep apnea) 09/05/2011  . Diabetes mellitus ([place]) 02/21/2011    Past Surgical History:  Procedure Laterality Date  . COLONOSCOPY    . GLAUCOMA SURGERY Bilateral    "laser"  . JOINT REPLACEMENT    . TOTAL KNEE ARTHROPLASTY Left 11/09/2015   Procedure: LEFT TOTAL KNEE ARTHROPLASTY;  Surgeon: Gaynelle Arabian, MD;  Location: [facility];  Service: Orthopedics;  Laterality: Left;       Family History  Problem Relation Age of Onset  . Stroke Mother   . Diabetes Mother   . Hypertension Mother   .  Hypertension Brother   . Diabetes Brother   . Diabetes Sister     Social History   Tobacco Use  . Smoking status: Former Smoker    Packs/day: 1.00    Years: 7.00    Pack years: 7.00    Types: Cigarettes    Quit date: 08/01/1974    Years since quitting: 45.0  . Smokeless tobacco: Never Used  Substance Use Topics  . Alcohol use: No  . Drug use: No    Home Medications Prior to Admission medications   Medication Sig Start Date End Date Taking? Authorizing Provider    acetaZOLAMIDE (DIAMOX) 500 MG capsule Take by mouth. 05/25/19   [provider]  amLODipine-benazepril (LOTREL) 10-20 MG capsule Take 1 capsule by mouth daily.  12/27/17   [provider]  amoxicillin (AMOXIL) 500 MG capsule TAKE 4 CAPSULE 1HR PRIOR TO DENTAL APPT 09/06/18   [provider]  amoxicillin-clavulanate (AUGMENTIN) 875-125 MG tablet TAKE ONE TABLET BY MOUTH 2 (TWO) TIMES DAILY FOR 7 DAYS. 07/04/18   [provider]  aspirin 81 MG chewable tablet Chew 81 mg by mouth daily.    [provider]  bacitracin ointment Apply topically 2 (two) times daily. 06/04/18   Lady Deutscher, MD  BD PEN NEEDLE NANO U/F 32G X 4 MM MISC USE AS DIRECTED 4 TIMES A DAY 03/30/18   [provider]  bimatoprost (LUMIGAN) 0.01 % SOLN Place 1 drop into both eyes at bedtime.  09/16/14   [provider]  brimonidine-timolol (COMBIGAN) 0.2-0.5 % ophthalmic solution Place 1 drop into both eyes 2 (two) times daily.  04/12/15   [provider]  docusate sodium (COLACE) 100 MG capsule Take by mouth.    [provider]  gabapentin (NEURONTIN) 300 MG capsule Take 300 mg by mouth 2 (two) times daily.  10/13/17   [provider]  HUMALOG KWIKPEN 100 UNIT/ML KiwkPen Inject 0-60 Units into the skin 3 (three) times daily. Per sliding scale three times daily with meals 03/13/18   [provider]  JARDIANCE 25 MG TABS tablet Take 25 mg by mouth daily. 02/21/18   [provider]  Lancets Glory Rosebush ULTRASOFT) lancets USE AS INSTRUCTED 06/28/13   [provider]  LANTUS SOLOSTAR 100 UNIT/ML Solostar Pen Inject 75 Units into the skin daily.  03/06/18   [provider]  linaclotide (LINZESS) 290 MCG CAPS capsule Take 290 mcg by mouth daily.  03/20/18   [provider]  liraglutide (VICTOZA) 18 MG/3ML SOPN Inject 0.3 mLs (1.8 mg total) into the skin daily. 06/04/18   Lady Deutscher, MD  magnesium citrate SOLN  Take 171mL once. Wait several hours and repeat if needed. 06/06/18   [provider]  metoCLOPramide (REGLAN) 10 MG tablet Take 1 tablet (10 mg total) by mouth every 6 (six) hours as needed for nausea. 06/09/18   Blanchie Dessert, MD  metoprolol succinate (TOPROL-XL) 100 MG 24 hr tablet Take 100 mg by mouth daily.  01/30/18   [provider]  Multiple Vitamin (MULTIVITAMIN) tablet Take 1 tablet by mouth daily. Centrum silver    [provider]  MYRBETRIQ 50 MG TB24 tablet Take 50 mg by mouth daily.  05/19/18   [provider]  ONE TOUCH ULTRA TEST test strip  03/12/18   [provider]  pantoprazole (PROTONIX) 40 MG tablet Take 40 mg by mouth daily.  04/30/18   [provider]  Probiotic Product ([place]) CAPS Take 1  capsule by mouth daily.    [provider]  RHOPRESSA 0.02 % SOLN Place 1 drop into both eyes every evening.  05/16/18   [provider]  simvastatin (ZOCOR) 20 MG tablet Take 20 mg by mouth daily at 6 PM.  03/06/18   [provider]  spironolactone (ALDACTONE) 25 MG tablet Take 25 mg by mouth daily.  01/30/18   [provider]  torsemide (DEMADEX) 20 MG tablet Take 20 mg by mouth daily.  04/20/18   [provider]  UNABLE TO FIND TAKE 2 TEASPOONS(10MLS) BY MOUTH 4 TIMES A DAY FOR 5 DAYS 06/04/18   [provider]  vardenafil (LEVITRA) 20 MG tablet Take 20 mg by mouth as needed for erectile dysfunction.  08/07/12   [provider]    Allergies    Nsaids, Aspirin, and Other  Review of Systems   Review of Systems  Neurological: Positive for syncope.  All other systems reviewed and are negative.   Physical Exam Updated Vital Signs BP 120/63 (BP Location: Right Arm)   Pulse 77   Temp 97.7 F (36.5 C) (Oral)   Resp (!) 23   Ht 5\' 10"  (1.778 m)   Wt 124.7 kg   SpO2 99%   BMI 39.46 kg/m   Physical Exam Vitals and nursing note reviewed.  Constitutional:        General: He is not in acute distress.    Appearance: He is well-developed.     Comments: Resting comfortably in the bed in no acute distress  HENT:     Head: Normocephalic.      Comments: No obvious swelling or deformity.  Mild tenderness palpation of the right forehead.  No hemotympanum or nasal septal hematoma.  No sign of mouth or tongue trauma Eyes:     Conjunctiva/sclera: Conjunctivae normal.     Pupils: Pupils are equal, round, and reactive to light.  Neck:     Comments: Moving head in all directions without pain.  No tenderness palpation of midline C-spine.  No step-offs or deformities. Cardiovascular:     Rate and Rhythm: Normal rate and regular rhythm.     Pulses: Normal pulses.  Pulmonary:     Effort: Pulmonary effort is normal. No respiratory distress.     Breath sounds: Normal breath sounds. No wheezing.     Comments: Clear lung sounds in all fields Abdominal:     General: There is no distension.     Palpations: Abdomen is soft. There is no mass.     Tenderness: There is no abdominal tenderness. There is no guarding or rebound.  Musculoskeletal:        General: Normal range of motion.     Cervical back: Normal range of motion and neck supple.     Right lower leg: No edema.     Left lower leg: No edema.     Comments: Strength and sensation intact x4.  Moving all extremities.  Skin:    General: Skin is warm and dry.     Capillary Refill: Capillary refill takes less than 2 seconds.  Neurological:     Mental Status: He is alert and oriented to person, place, and time.     ED Results / Procedures / Treatments   Labs (all labs ordered are listed, but only abnormal results are displayed) Labs Reviewed  CBC WITH DIFFERENTIAL/PLATELET - Abnormal; Notable for the following components:      Result Value   MCH 25.6 (*)  RDW 15.9 (*)    Monocytes Absolute 1.5 (*)    All other components within normal limits  COMPREHENSIVE METABOLIC PANEL - Abnormal; Notable for  the following components:   Potassium 5.9 (*)    CO2 21 (*)    Glucose, Bld 67 (*)    BUN 34 (*)    Creatinine, Ser 2.16 (*)    Calcium 8.5 (*)    Albumin 3.4 (*)    AST 66 (*)    GFR calc non Af Amer 30 (*)    GFR calc Af Amer 34 (*)    All other components within normal limits  CBG MONITORING, ED - Abnormal; Notable for the following components:   Glucose-Capillary 63 (*)    All other components within normal limits  CBG MONITORING, ED - Abnormal; Notable for the following components:   Glucose-Capillary 58 (*)    All other components within normal limits  CBG MONITORING, ED - Abnormal; Notable for the following components:   Glucose-Capillary 125 (*)    All other components within normal limits  URINALYSIS, ROUTINE W REFLEX MICROSCOPIC  POTASSIUM  CBG MONITORING, ED  TROPONIN I (HIGH SENSITIVITY)  TROPONIN I (HIGH SENSITIVITY)    EKG None  Radiology No results found.  Procedures Procedures (including critical care time)  Medications Ordered in ED Medications  dextrose 50 % solution 50 mL (50 mLs Intravenous Given 08/25/19 1426)    ED Course  I have reviewed the triage vital signs and the nursing notes.  Pertinent labs & imaging results that were available during my care of the patient were reviewed by me and considered in my medical decision making (see chart for details).    MDM Rules/Calculators/A&P                      Patient presenting for evaluation after syncopal event.  Physical examination, appears nontoxic.  Patient did have low blood pressure with EMS, likely dehydration causing syncope/orthostatic hypotension.  However considering his medical history, will obtain labs, CMP, chest x-ray, EKG.  Additionally, patient was found to be slightly hypoglycemic in the ED in the 60s.  Will give orange juice.  Repeat CBG 58.  Will give D50 and check sugars hourly.   Labs overall reassuring.  No leukocytosis.  Creatinine mildly elevated at baseline at 2.1, but  not quite elevated.  Likely secondary to mild dehydration. Will give 500 cc, being careful 2/2 CHF.  Potassium is elevated, but also hemolyzed.  As such, will redraw.  Initial troponin normal.  UA without infection.  Chest x-ray viewed interpreted by me, shows stable cardiomegaly, but no pneumonia pneumothorax and effusion.  CT head negative for acute findings.  Repeat troponin normal again.  Nonhemolyzed potassium was 3.4.  CBG has been staying stable in the low 100s.  Orthostatics show mild elevation in heart rate, but no change in blood pressure.  Patient asymptomatic with orthostatics.  Discussed with patient that due to his dehydration and syncopal event, we could observe him overnight and continue to monitor his blood sugar and vitals.  Patient states he would rather go home.  I encourage close monitoring of his symptoms and close follow-up with his primary care doctor.  Discussed importance of eating regular meals and taking his medicine as prescribed.  Encouraged hydration. Case dsicussed with attending, Dr. Alvino Chapel agrees to plan. At this time, patient appears safe for discharge.  Return precautions given.  Patient states he understands and agrees to plan.  Final  Clinical Impression(s) / ED Diagnoses Final diagnoses:  None    Rx / DC Orders ED Discharge Orders    None       Franchot Heidelberg, PA-C 08/25/19 1946    Davonna Belling, MD 08/25/19 1952

## 2019-08-25 NOTE — ED Notes (Signed)
Lab called. Green top hemolyzed, redrawn for potassium to be run.

## 2019-08-25 NOTE — Discharge Instructions (Addendum)
Continue taking home medications as prescribed. If you blood sugar is below 150 after eating dinner, do no take fast-acting insulin, only the long-acting.  Resume normal blood sugar mediations tomorrow.  Follow up with your primary care doctor for further evaluation and management. Return to the ER if you develop dizziness, weakness, light-headedness, chest pain, difficulty breathing, or any new, worsening, or concerning symptoms.

## 2019-08-25 NOTE — ED Notes (Signed)
Patient transported to X-ray 

## 2019-08-25 NOTE — ED Notes (Signed)
Patient verbalizes understanding of discharge instructions. Opportunity for questioning and answers were provided. Armband removed by staff, pt discharged from ED to home via  [place] with wife.

## 2019-08-25 NOTE — ED Triage Notes (Addendum)
Pt presents from home after a syncopal episode this AM, felt fine pprior to event, family heard thud and found pt semi-responsive for approx 30 sec prior to becoming fully alert. H/o similar event in 2019, seen overnight for obs, no cause found.  EMS exam - 88/40 standing, 134/70 supine after 500cc bolus, R facial pain, no SOB/CP/N/V, no edema, no rales  H/o CHF No blood thinners per pt

## 2019-10-25 ENCOUNTER — Ambulatory Visit: Payer: Medicare PPO | Admitting: Podiatry

## 2019-10-25 ENCOUNTER — Encounter: Payer: Self-pay | Admitting: Podiatry

## 2019-10-25 ENCOUNTER — Other Ambulatory Visit: Payer: Self-pay

## 2019-10-25 VITALS — Temp 97.0°F

## 2019-10-25 DIAGNOSIS — B351 Tinea unguium: Secondary | ICD-10-CM

## 2019-10-25 DIAGNOSIS — E119 Type 2 diabetes mellitus without complications: Secondary | ICD-10-CM

## 2019-10-25 DIAGNOSIS — M79675 Pain in left toe(s): Secondary | ICD-10-CM | POA: Diagnosis not present

## 2019-10-25 DIAGNOSIS — I96 Gangrene, not elsewhere classified: Secondary | ICD-10-CM | POA: Diagnosis not present

## 2019-10-25 DIAGNOSIS — M79674 Pain in right toe(s): Secondary | ICD-10-CM

## 2019-10-26 NOTE — Progress Notes (Signed)
This patient returns to my office for at risk foot care.  This patient requires this care by a professional since this patient will be at risk due to having diabetes  and CKD.  This patient is unable to cut nails himself since the patient cannot reach his nails.These nails are painful walking and wearing shoes.  This patient presents for at risk foot care today. Patient has blackened skin lesion on the outside of right heel.  General Appearance  Alert, conversant and in no acute stress.  Vascular  Dorsalis pedis and posterior tibial  pulses are palpable  bilaterally.  Capillary return is within normal limits  bilaterally. Temperature is within normal limits  bilaterally.  Neurologic  Senn-Weinstein monofilament wire test within normal limits  bilaterally. Muscle power within normal limits bilaterally.  Nails Thick disfigured discolored nails with subungual debris  from hallux to fifth toes bilaterally. No evidence of bacterial infection or drainage bilaterally.  Orthopedic  No limitations of motion  feet .  No crepitus or effusions noted.  No bony pathology or digital deformities noted.  Skin  normotropic skin with no porokeratosis noted bilaterally.  No signs of infections or ulcers noted.   Skin necrotss lateral aspect right heel.  Onychomycosis  Pain in right toes  Pain in left toes  Consent was obtained for treatment procedures.   Mechanical debridement of nails 1-5  bilaterally performed with a nail nipper.  Filed with dremel without incident.    Return office visit    9 weeks                 Told patient to return for periodic foot care and evaluation due to potential at risk complications. Heel cushion dispensed.   Gardiner Barefoot DPM

## 2019-12-27 ENCOUNTER — Telehealth: Payer: Self-pay | Admitting: *Deleted

## 2019-12-27 ENCOUNTER — Other Ambulatory Visit: Payer: Self-pay

## 2019-12-27 ENCOUNTER — Encounter: Payer: Self-pay | Admitting: Podiatry

## 2019-12-27 ENCOUNTER — Ambulatory Visit: Payer: Medicare PPO | Admitting: Podiatry

## 2019-12-27 VITALS — Temp 96.8°F

## 2019-12-27 DIAGNOSIS — E119 Type 2 diabetes mellitus without complications: Secondary | ICD-10-CM | POA: Diagnosis not present

## 2019-12-27 DIAGNOSIS — B351 Tinea unguium: Secondary | ICD-10-CM | POA: Diagnosis not present

## 2019-12-27 DIAGNOSIS — N183 Chronic kidney disease, stage 3 unspecified: Secondary | ICD-10-CM

## 2019-12-27 DIAGNOSIS — R609 Edema, unspecified: Secondary | ICD-10-CM

## 2019-12-27 DIAGNOSIS — M79674 Pain in right toe(s): Secondary | ICD-10-CM

## 2019-12-27 DIAGNOSIS — M79675 Pain in left toe(s): Secondary | ICD-10-CM

## 2019-12-27 DIAGNOSIS — R0989 Other specified symptoms and signs involving the circulatory and respiratory systems: Secondary | ICD-10-CM

## 2019-12-27 NOTE — Progress Notes (Signed)
This patient returns to my office for at risk foot care.  This patient requires this care by a professional since this patient will be at risk due to having diabetes  and CKD.  This patient is unable to cut nails himself since the patient cannot reach his nails.These nails are painful walking and wearing shoes.  This patient presents for at risk foot care today.   General Appearance  Alert, conversant and in no acute stress.  Vascular  Dorsalis pedis and posterior tibial  pulses are not  palpable  Bilaterally due to swelling..  Capillary return is within normal limits  bilaterally. Temperature is within normal limits  bilaterally.  Neurologic  Senn-Weinstein monofilament wire test within normal limits  bilaterally. Muscle power within normal limits bilaterally.  Nails Thick disfigured discolored nails with subungual debris  from hallux to fifth toes bilaterally. No evidence of bacterial infection or drainage bilaterally.  Orthopedic  No limitations of motion  feet .  No crepitus or effusions noted.  No bony pathology or digital deformities noted.  Skin  normotropic skin with no porokeratosis noted bilaterally.  No signs of infections or ulcers noted.   Skin necrotss lateral aspect right heel.  Onychomycosis  Pain in right toes  Pain in left toes  Consent was obtained for treatment procedures.   Mechanical debridement of nails 1-5  bilaterally performed with a nail nipper.  Filed with dremel without incident.    Return office visit    9 weeks                 Told patient to return for periodic foot care and evaluation due to potential at risk complications.  Performed a diabetic foot exam today and found absent pulses as well as foot swelling.  No leg claudication.  Recommended vascular study be performed.   Gardiner Barefoot DPM

## 2019-12-27 NOTE — Telephone Encounter (Signed)
Dr. Prudence Davidson requested vascular studies for absent pulses and swelling. Faxed orders to [facility].

## 2020-01-01 ENCOUNTER — Other Ambulatory Visit: Payer: Self-pay | Admitting: Podiatry

## 2020-01-01 DIAGNOSIS — R609 Edema, unspecified: Secondary | ICD-10-CM

## 2020-01-02 ENCOUNTER — Other Ambulatory Visit: Payer: Self-pay

## 2020-01-02 ENCOUNTER — Ambulatory Visit (HOSPITAL_COMMUNITY)
Admission: RE | Admit: 2020-01-02 | Discharge: 2020-01-02 | Disposition: A | Discharge status: Home / Self Care | Payer: Medicare PPO | Source: Ambulatory Visit | Attending: Cardiology | Admitting: Cardiology

## 2020-01-02 DIAGNOSIS — R609 Edema, unspecified: Secondary | ICD-10-CM | POA: Diagnosis not present

## 2020-01-20 ENCOUNTER — Other Ambulatory Visit: Payer: Self-pay

## 2020-01-20 ENCOUNTER — Ambulatory Visit (HOSPITAL_COMMUNITY)
Admission: RE | Admit: 2020-01-20 | Discharge: 2020-01-20 | Disposition: A | Discharge status: Home / Self Care | Payer: Medicare PPO | Source: Ambulatory Visit | Attending: Cardiovascular Disease | Admitting: Cardiovascular Disease

## 2020-01-20 DIAGNOSIS — N183 Chronic kidney disease, stage 3 unspecified: Secondary | ICD-10-CM | POA: Diagnosis present

## 2020-01-20 DIAGNOSIS — R609 Edema, unspecified: Secondary | ICD-10-CM | POA: Insufficient documentation

## 2020-01-20 DIAGNOSIS — R0989 Other specified symptoms and signs involving the circulatory and respiratory systems: Secondary | ICD-10-CM | POA: Diagnosis present

## 2020-02-01 IMAGING — CT CT HEAD W/O CM
3 series · 16 of 47 positions shown, 19 images · non-contrast
Comparison: None.

CLINICAL DATA: Facial trauma

EXAM:
CT HEAD WITHOUT CONTRAST
CT MAXILLOFACIAL WITHOUT CONTRAST
TECHNIQUE: Multidetector CT imaging of the head and maxillofacial structures
were performed using the standard protocol without intravenous
contrast. Multiplanar CT image reconstructions of the maxillofacial
structures were also generated.

[Series 7: facialbone 2.0 cor st · coronal · 0.41mm/px · 3 of 105 slices shown]
[im 35/105  brain]
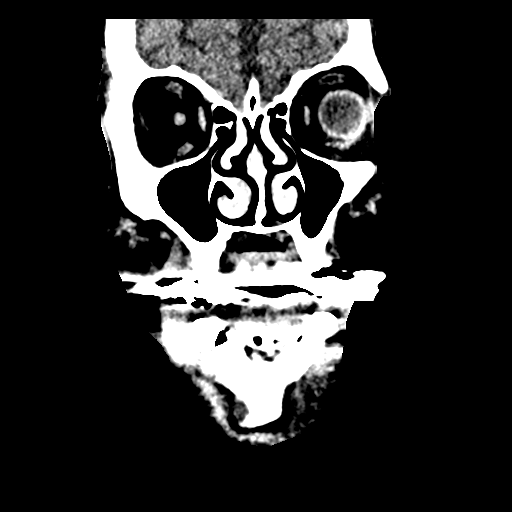
[im 47/105  brain]
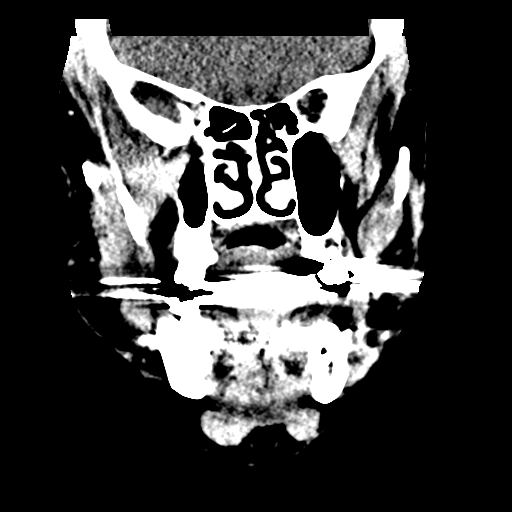
[im 58/105  brain]
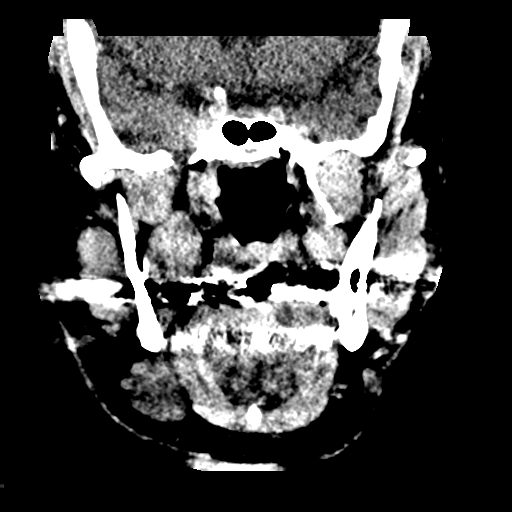

[Series 8: facialbone 2.0 sag st · sagittal · 0.37mm/px · 3 of 125 slices shown]
[im 42/125  brain]
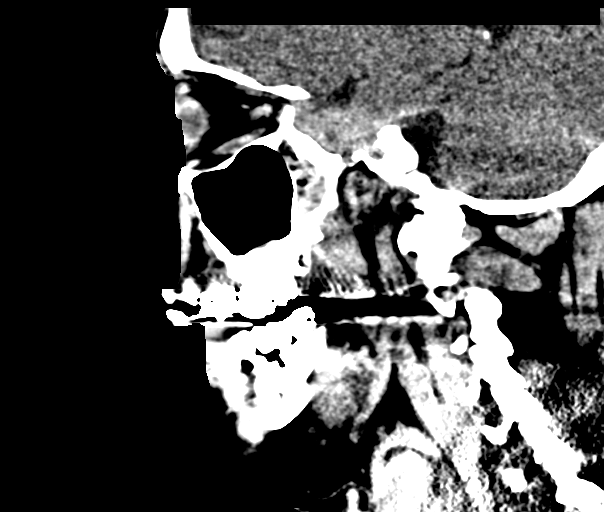
[im 63/125  brain]
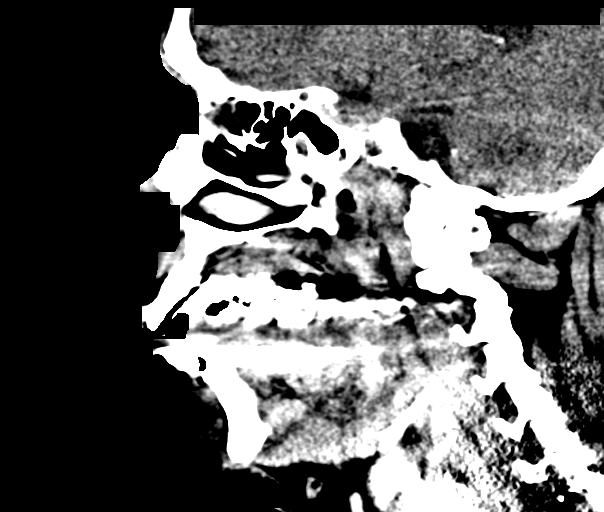
[im 83/125  brain]
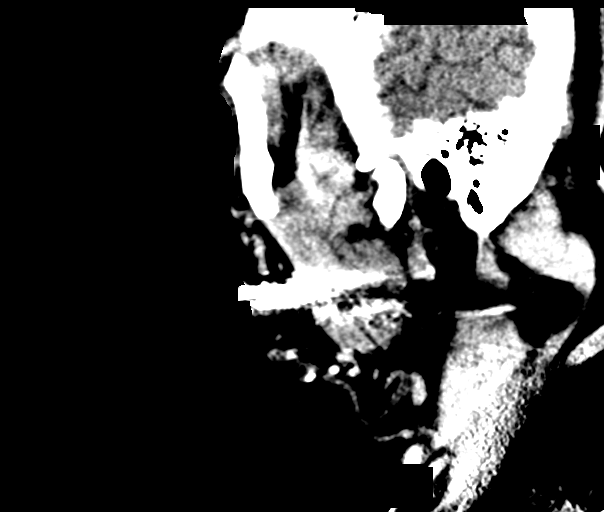

[Series 11: facialbone 2.0 (person_name) (person_name) · axial · 0.38mm/px · z∈[-266,-102]mm · 10 of 96 slices shown, 13 images]
[im 7/96  brain]
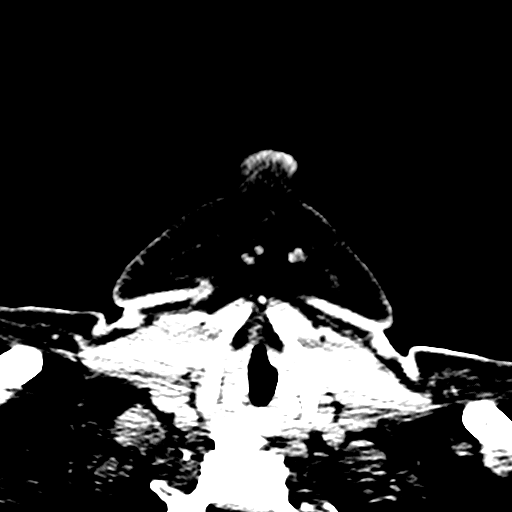
[im 7/96  bone]
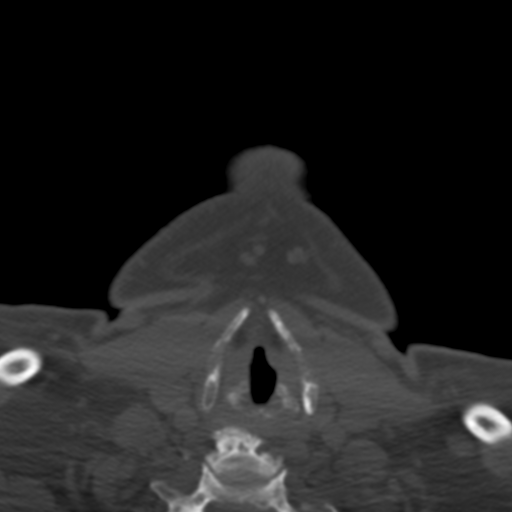
[im 17/96  brain]
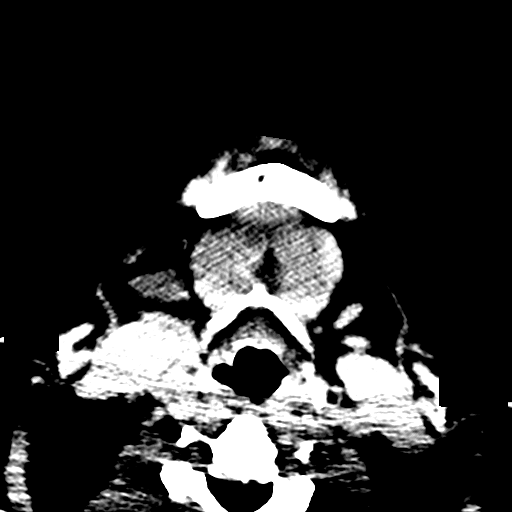
[im 27/96  brain]
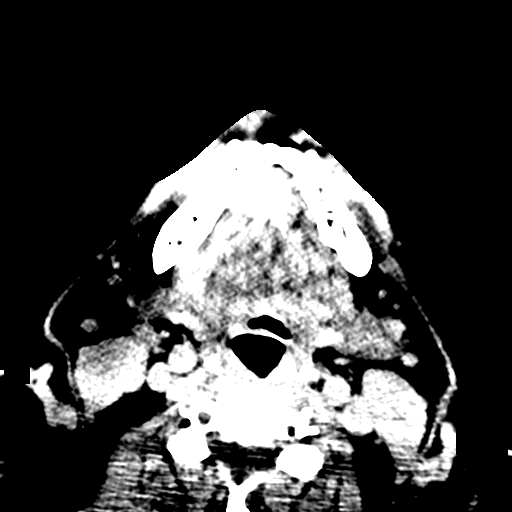
[im 33/96  brain]
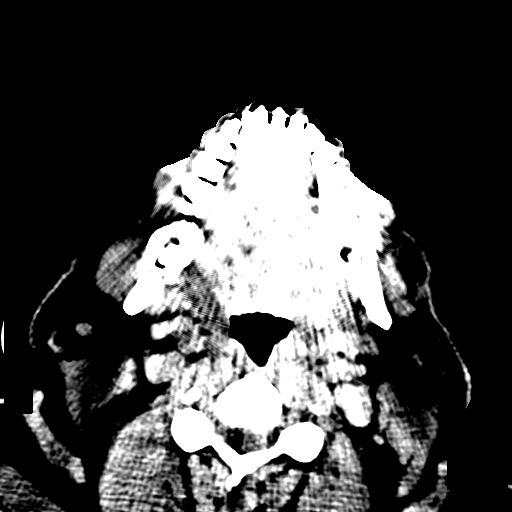
[im 43/96  brain]
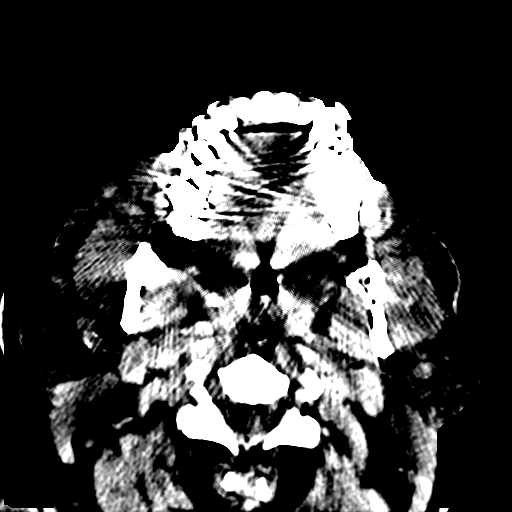
[im 43/96  bone]
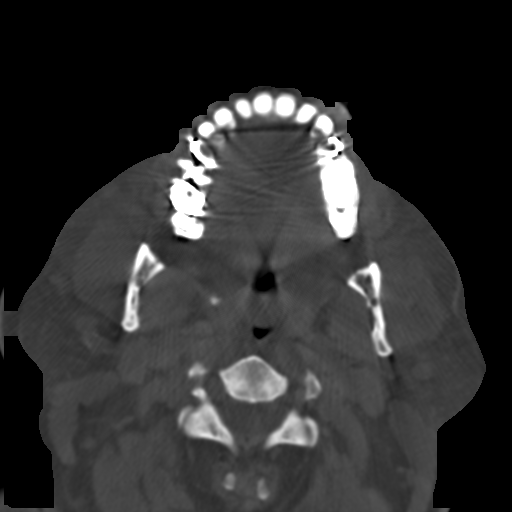
[im 53/96  brain]
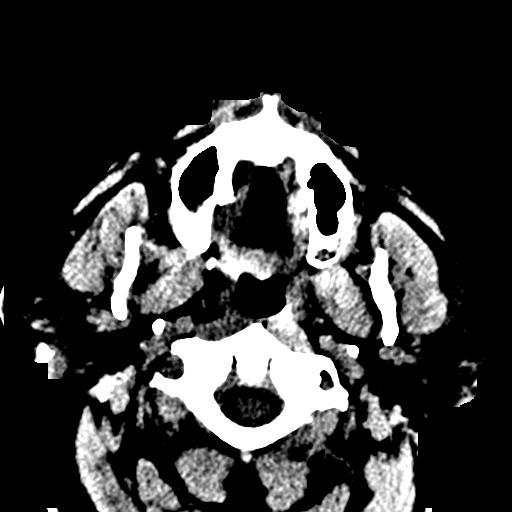
[im 63/96  brain]
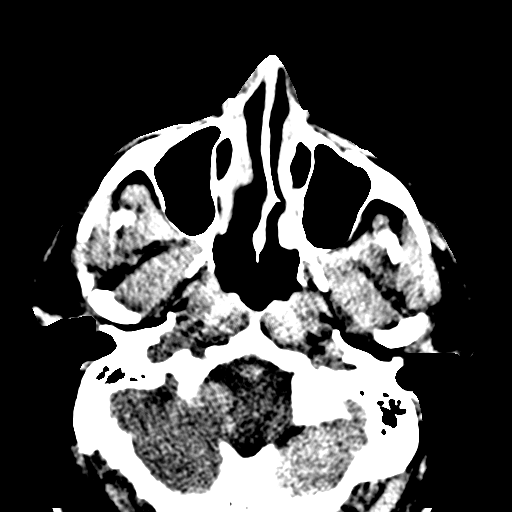
[im 73/96  brain]
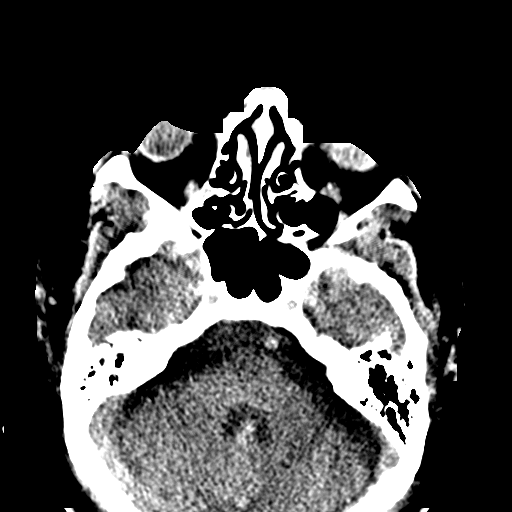
[im 79/96  brain]
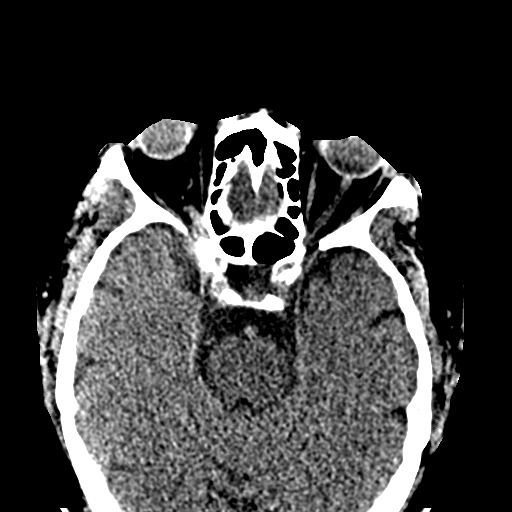
[im 79/96  bone]
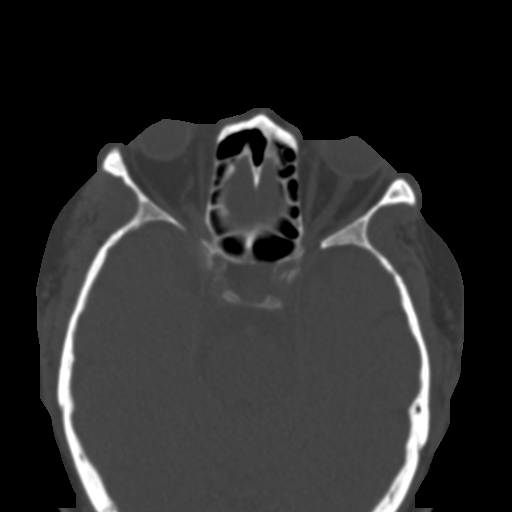
[im 89/96  brain]
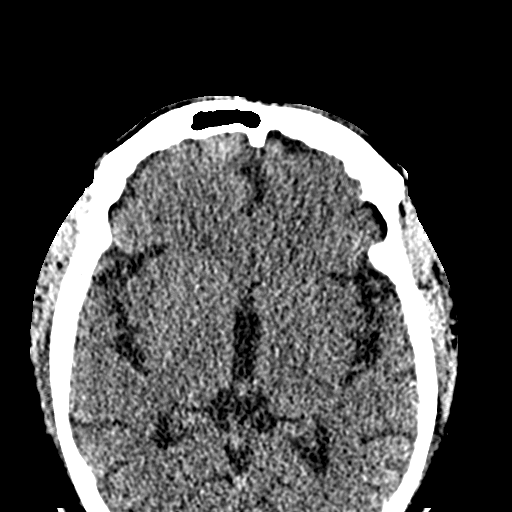

[16 of 47 positions shown; findings below may reference images not displayed]

FINDINGS: CT HEAD FINDINGS

Brain: Mild to moderate atrophy. Mild chronic microvascular ischemic
change in the white matter.

Negative for acute infarct, hemorrhage, mass.  No midline shift.

Vascular: Vascular calcification.  Negative for hyperdense vessel

Skull: Negative

Other: None

CT MAXILLOFACIAL FINDINGS

Osseous: Negative for facial fracture

Orbits: Normal orbital soft tissues.  No hematoma or mass.

Sinuses: Mild mucosal edema paranasal sinuses.  No air-fluid level.

Soft tissues: Mild supraorbital soft tissue swelling in the scalp
right greater than left.
IMPRESSION: 1. No acute intracranial abnormality
2. Negative for facial fracture

## 2020-02-01 IMAGING — CT CT ABD-PELV W/O CM
3 of 5 series · 17 of 46 positions shown, 19 images · non-contrast
Comparison: CT 12/31/2015

CLINICAL DATA: Abdominal pain, acute generalized pain

EXAM:
CT ABDOMEN AND PELVIS WITHOUT CONTRAST
TECHNIQUE: Multidetector CT imaging of the abdomen and pelvis was performed
following the standard protocol without IV contrast.

[Series 4: a/p w/o 5mm · axial · non-contrast · 0.78mm/px · z∈[-877,-427]mm · 12 of 108 slices shown, 14 images]
[im 9/108  soft-tissue]
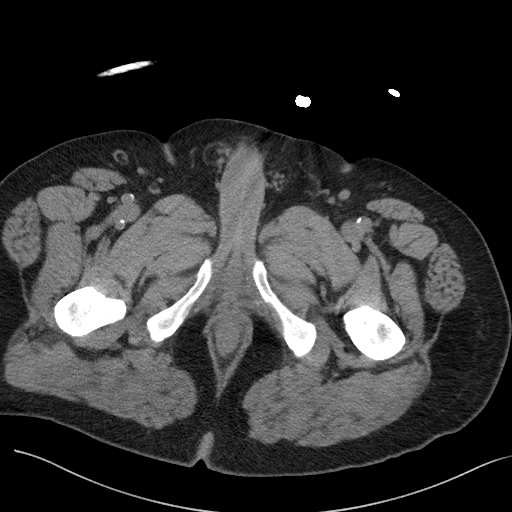
[im 9/108  bone]
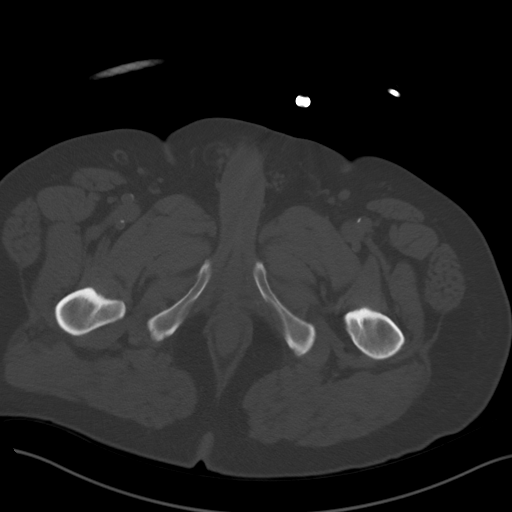
[im 17/108  soft-tissue]
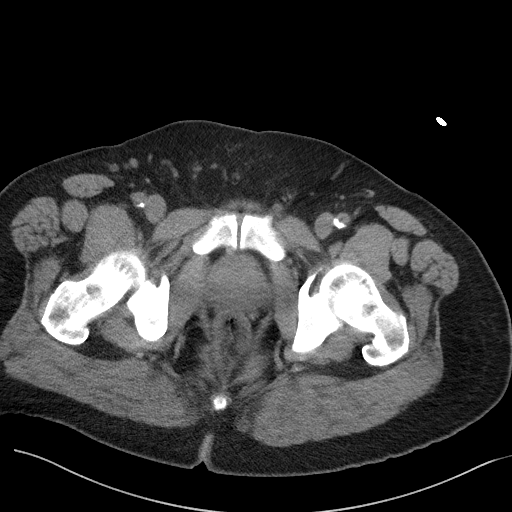
[im 25/108  soft-tissue]
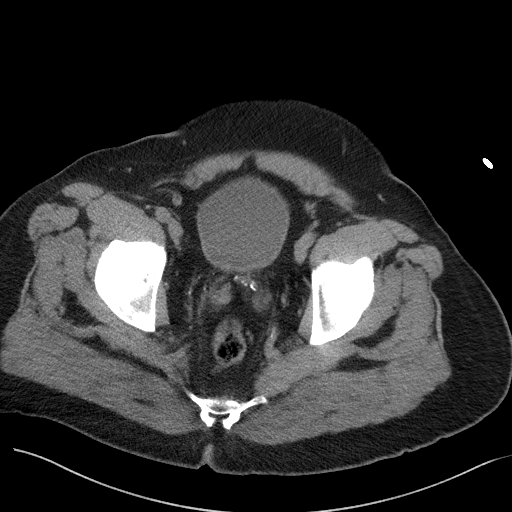
[im 33/108  soft-tissue]
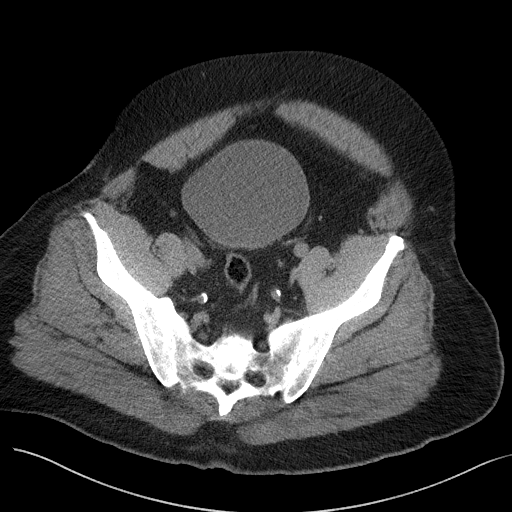
[im 42/108  soft-tissue]
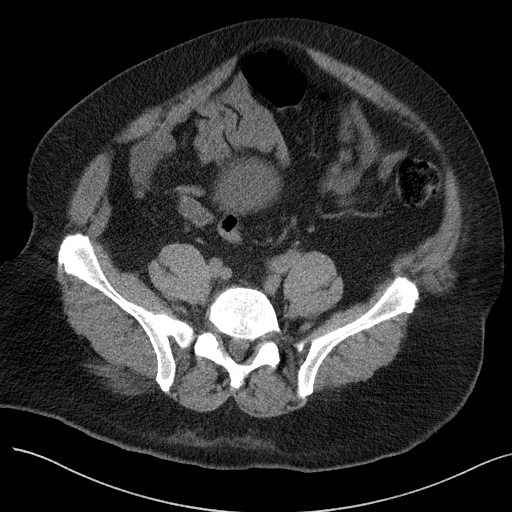
[im 50/108  soft-tissue]
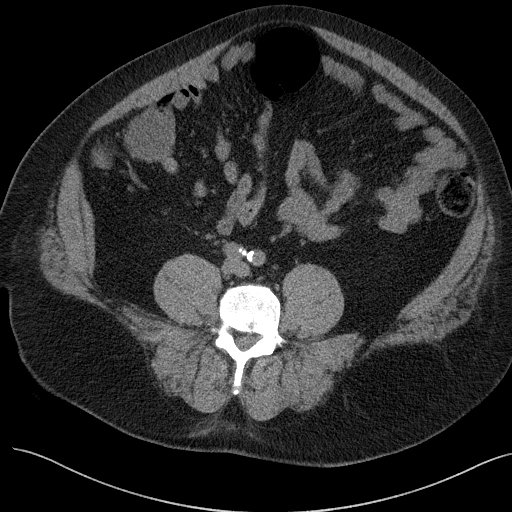
[im 58/108  soft-tissue]
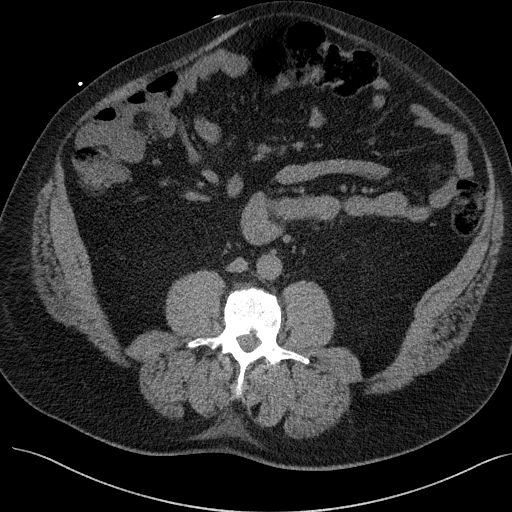
[im 66/108  soft-tissue]
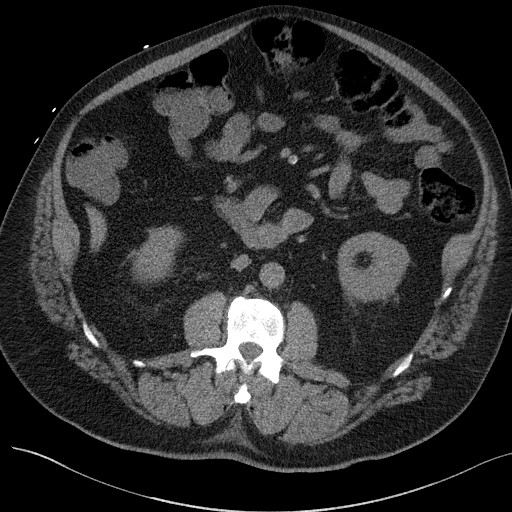
[im 75/108  soft-tissue]
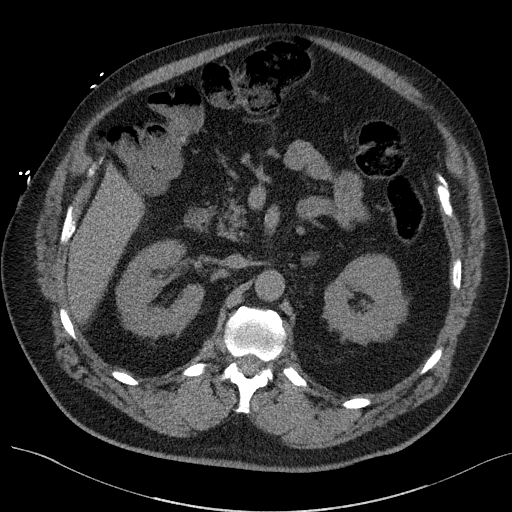
[im 75/108  bone]
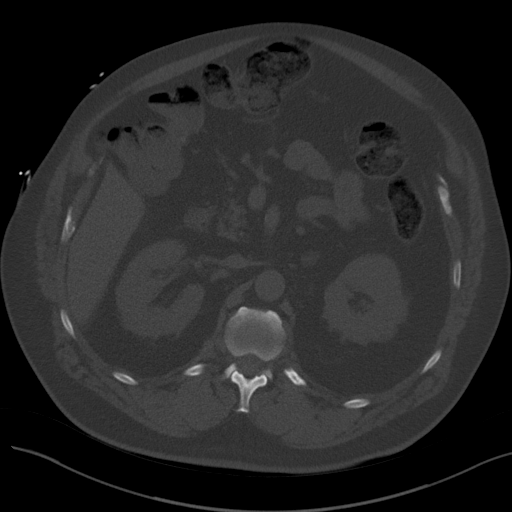
[im 83/108  soft-tissue]
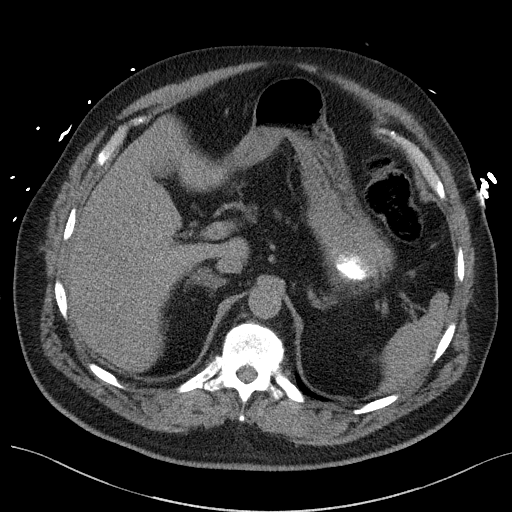
[im 91/108  soft-tissue]
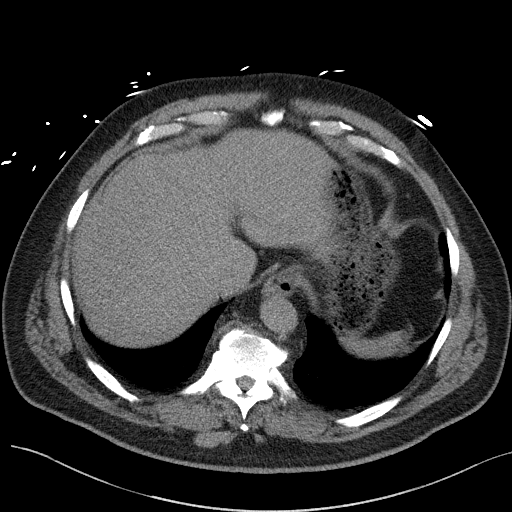
[im 99/108  soft-tissue]
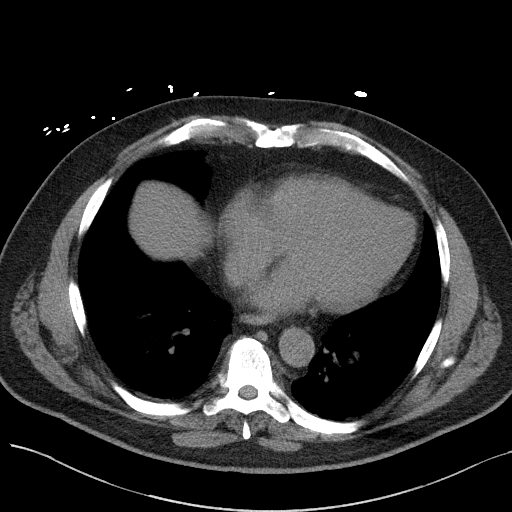

[Series 5: lung · axial · 0.78mm/px · z∈[-472,-427]mm · 2 of 28 slices shown]
[im 10/28  bone]
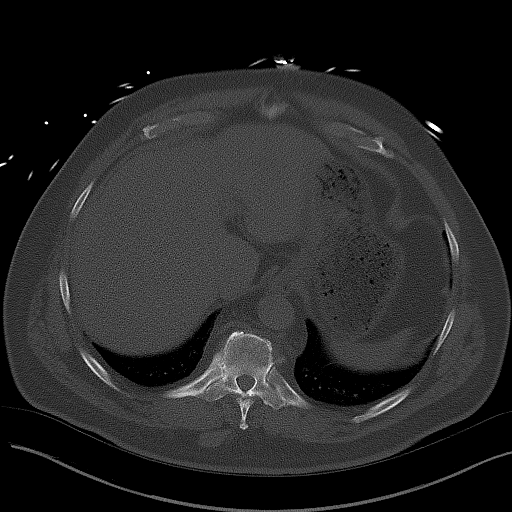
[im 19/28  bone]
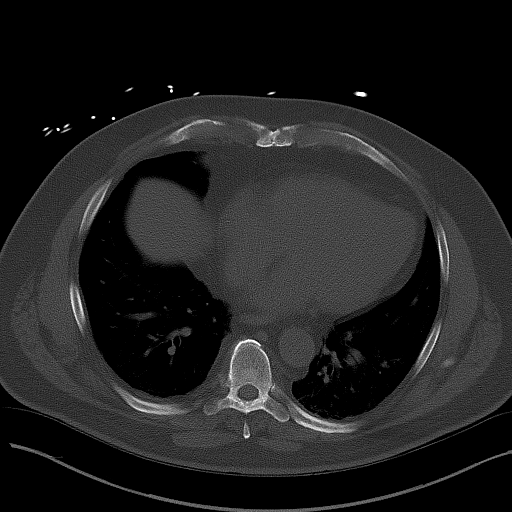

[Series 7: a/p w/o cor · coronal · non-contrast · 0.86mm/px · 3 of 198 slices shown]
[im 66/198  soft-tissue]
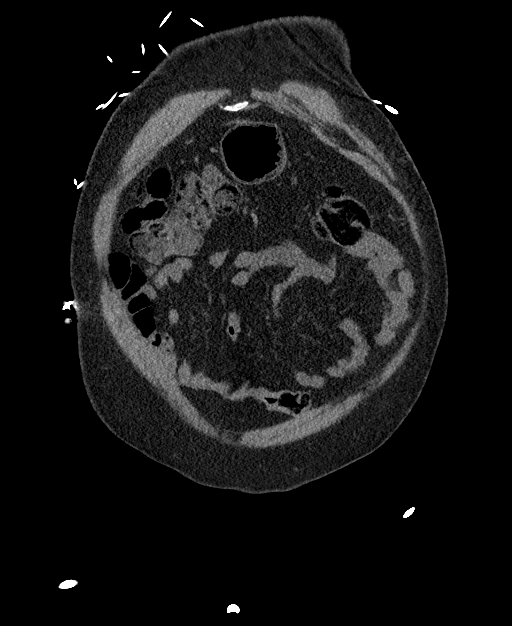
[im 88/198  soft-tissue]
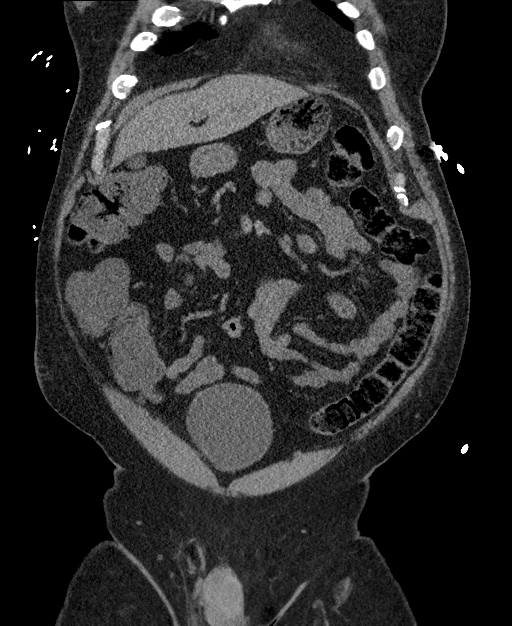
[im 110/198  soft-tissue]
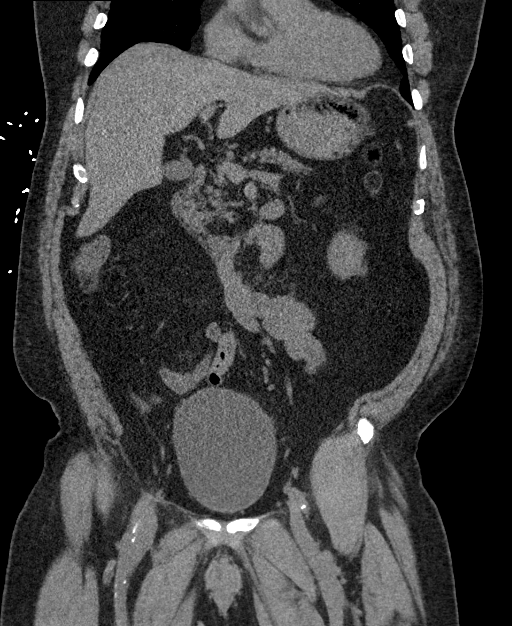

[17 of 46 positions shown; findings below may reference images not displayed]

FINDINGS: Lower chest: Lung bases are clear.

Hepatobiliary: No focal hepatic lesion. No biliary duct dilatation.
Gallbladder is normal. Common bile duct is normal.

Pancreas: Pancreas is normal. No ductal dilatation. No pancreatic
inflammation.

Spleen: Normal spleen

Adrenals/urinary tract: Adrenal glands normal. Kidneys, ureters
bladder normal. Bladder is mildly distended

Stomach/Bowel: Stomach, small bowel, appendix, and cecum are normal.
The colon and rectosigmoid colon are normal.

Vascular/Lymphatic: Abdominal aorta is normal caliber with
atherosclerotic calcification. There is no retroperitoneal or
periportal lymphadenopathy. No pelvic lymphadenopathy.

Reproductive: Prostate normal

Other: No free fluid.

Musculoskeletal: No aggressive osseous lesion.
IMPRESSION: No acute abdominopelvic findings.

Mildly distended bladder.

## 2020-02-28 ENCOUNTER — Encounter: Payer: Self-pay | Admitting: Podiatry

## 2020-02-28 ENCOUNTER — Other Ambulatory Visit: Payer: Self-pay

## 2020-02-28 ENCOUNTER — Ambulatory Visit: Payer: Medicare PPO | Admitting: Podiatry

## 2020-02-28 DIAGNOSIS — B351 Tinea unguium: Secondary | ICD-10-CM

## 2020-02-28 DIAGNOSIS — E119 Type 2 diabetes mellitus without complications: Secondary | ICD-10-CM | POA: Diagnosis not present

## 2020-02-28 DIAGNOSIS — M79675 Pain in left toe(s): Secondary | ICD-10-CM

## 2020-02-28 DIAGNOSIS — R0989 Other specified symptoms and signs involving the circulatory and respiratory systems: Secondary | ICD-10-CM | POA: Diagnosis not present

## 2020-02-28 DIAGNOSIS — M79674 Pain in right toe(s): Secondary | ICD-10-CM

## 2020-02-28 DIAGNOSIS — N183 Chronic kidney disease, stage 3 unspecified: Secondary | ICD-10-CM | POA: Diagnosis not present

## 2020-02-28 NOTE — Progress Notes (Signed)
Patient ID: Micheal Morris, male   DOB: October 06, 1945, 74 y.o.   MRN: [id number] Complaint:  Visit Type: Patient returns to my office for continued preventative foot care services. Complaint: Patient states" my nails have grown long and thick and become painful to walk and wear shoes" Patient has been diagnosed with DM with  neuropathy.. The patient presents for preventative foot care services. No changes to ROS.  No pain from lesion right heel.  Podiatric Exam: Vascular: dorsalis pedis and posterior tibial pulses are palpable bilateral. Capillary return is immediate. Temperature gradient is WNL. Skin turgor WNL  Sensorium: Normal Semmes Weinstein monofilament test. Normal tactile sensation bilaterally. Nail Exam: Pt has thick disfigured discolored nails with subungual debris noted bilateral entire nail hallux through fifth toenails Ulcer Exam: There is no evidence of ulcer or pre-ulcerative changes or infection. Orthopedic Exam: Muscle tone and strength are WNL. No limitations in general ROM. No crepitus or effusions noted. Foot type and digits show no abnormalities. Bony prominences are unremarkable. Skin: No Porokeratosis. No infection or ulcers.  .Healed skin necrosis right heel.  Diagnosis:  Onychomycosis, , Pain in right toe, pain in left toes  Treatment & Plan Procedures and Treatment: Consent by patient was obtained for treatment procedures. The patient understood the discussion of treatment and procedures well. All questions were answered thoroughly reviewed. Debridement of mycotic and hypertrophic toenails, 1 through 5 bilateral and clearing of subungual debris. No ulceration, no infection noted.  Return Visit-Office Procedure: Patient instructed to return to the office for a follow up visit 9   weeks  for continued evaluation and treatment.  Gardiner Barefoot DPM

## 2020-03-23 HISTORY — PX: EYE SURGERY: SHX253

## 2020-03-23 HISTORY — PX: CATARACT EXTRACTION: SUR2

## 2020-04-02 ENCOUNTER — Ambulatory Visit (INDEPENDENT_AMBULATORY_CARE_PROVIDER_SITE_OTHER): Payer: Medicare PPO | Admitting: Ophthalmology

## 2020-04-02 ENCOUNTER — Encounter (INDEPENDENT_AMBULATORY_CARE_PROVIDER_SITE_OTHER): Payer: Self-pay | Admitting: Ophthalmology

## 2020-04-02 ENCOUNTER — Other Ambulatory Visit: Payer: Self-pay

## 2020-04-02 DIAGNOSIS — Z961 Presence of intraocular lens: Secondary | ICD-10-CM

## 2020-04-02 DIAGNOSIS — H35033 Hypertensive retinopathy, bilateral: Secondary | ICD-10-CM

## 2020-04-02 DIAGNOSIS — E113393 Type 2 diabetes mellitus with moderate nonproliferative diabetic retinopathy without macular edema, bilateral: Secondary | ICD-10-CM

## 2020-04-02 DIAGNOSIS — I10 Essential (primary) hypertension: Secondary | ICD-10-CM

## 2020-04-02 DIAGNOSIS — H4041X0 Glaucoma secondary to eye inflammation, right eye, stage unspecified: Secondary | ICD-10-CM

## 2020-04-02 DIAGNOSIS — H209 Unspecified iridocyclitis: Secondary | ICD-10-CM

## 2020-04-02 DIAGNOSIS — H25812 Combined forms of age-related cataract, left eye: Secondary | ICD-10-CM

## 2020-04-02 DIAGNOSIS — H3581 Retinal edema: Secondary | ICD-10-CM | POA: Diagnosis not present

## 2020-04-02 NOTE — Progress Notes (Signed)
[facility] Note  04/02/2020     CHIEF COMPLAINT Patient presents for Retina Evaluation   HISTORY OF PRESENT ILLNESS: Micheal Morris is a 74 y.o. male who presents to the clinic today for:   HPI    Retina Evaluation    In right eye.  This started 2 days ago.  Duration of 2 days.  Associated Symptoms Pain, Photophobia, Redness and Glare.  Context:  distance vision, mid-range vision and near vision.  Treatments tried include eye drops and injection.  Response to treatment was no improvement.  I, the attending physician,  performed the HPI with the patient and updated documentation appropriately.          Comments    74 y/o male pt referred by Dr. 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  Pt had cat sx along w/trabeculectomy OD on 03/23/20, and feels his vision OD never recovered afterwards.  VA OS blurred as well due to cataract.  Pt saw Dr.Whitaker yesterday and received an injection OD to "help the scar tissue."  Pt went back to Dr. [name] earlier this afternoon for f/u and was immediately referred here.  Pt's OD has been red and very painful for 2 days.  Also c/o intense photophobia OD.  Denies FOL, floaters.  BS 177 this a.m.  A1C unknown.  Pred Q2hrs OD Atropine TID OD Prolensa QD OD Will be starting Besivance Combigan BID OS Lumigan QHS OS       Last edited by Bernarda Caffey, MD on 04/02/2020  4:54 PM. (History)    pt is here on the referral of Dr. 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 Ricki Miller who referred him to Dr. Zadie Rhine, pt states Dr. Zadie Rhine has done laser procedures in both eyes for diabetes, pt states a few years back he was on a regular regimen of NSAIDS for arthritis and general body aches, pt states his diabetes his controlled  Referring physician: Marylynn Pearson, MD [address] [place],  [place] [id number]  HISTORICAL INFORMATION:   Selected notes from the MEDICAL RECORD NUMBER Referred by Dr. 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 atropine 1 % ophthalmic solution   . bimatoprost (LUMIGAN) 0.01 % SOLN Place 1 drop into both eyes at bedtime.   . brimonidine-timolol (COMBIGAN) 0.2-0.5 % ophthalmic solution Place 1 drop into both eyes 2 (two) times daily.   . prednisoLONE acetate (PRED FORTE) 1 % ophthalmic suspension   . RHOPRESSA 0.02 % SOLN Place 1 drop into both eyes every evening.  (Patient not taking: Reported on 04/02/2020)   No current facility-administered medications for this visit. (Ophthalmic Drugs)   Current Outpatient Medications (Other)  Medication Sig  . acetaZOLAMIDE (DIAMOX) 500 MG capsule Take by mouth.  Marland Kitchen  amLODipine-benazepril (LOTREL) 10-20 MG capsule One capsule daily  . amoxicillin (AMOXIL) 500 MG capsule TAKE 4 CAPSULE 1HR PRIOR TO DENTAL APPT  . amoxicillin-clavulanate (AUGMENTIN) 875-125 MG tablet TAKE ONE TABLET BY MOUTH 2 (TWO) TIMES DAILY FOR 7 DAYS.  Marland Kitchen aspirin 81 MG chewable tablet Chew 81 mg by mouth daily.  . bacitracin ointment Apply topically 2 (two) times daily.  . BD PEN NEEDLE NANO U/F 32G X 4 MM MISC USE AS DIRECTED 4 TIMES A DAY  . Blood Glucose Monitoring Suppl (ACCU-CHEK GUIDE) w/Device KIT   . docusate sodium (COLACE) 100 MG capsule Take by mouth.  . gabapentin (NEURONTIN) 300 MG capsule Take 2 capsules tid  . HUMALOG KWIKPEN 100  UNIT/ML KiwkPen Inject 0-60 Units into the skin 3 (three) times daily. Per sliding scale three times daily with meals  . insulin glargine (LANTUS) 100 UNIT/ML injection Inject into the skin.  Marland Kitchen JARDIANCE 25 MG TABS tablet Take 25 mg by mouth daily.  . Lancets (ONETOUCH ULTRASOFT) lancets USE AS INSTRUCTED  . linaclotide (LINZESS) 290 MCG CAPS capsule TAKE 1 CAPSULE BY MOUTH EVERY DAY  . liraglutide (VICTOZA) 18 MG/3ML SOPN Inject 0.3 mLs (1.8 mg total) into the skin daily.  . magnesium citrate SOLN Take 146mL once. Wait several hours and repeat if needed.  . metoCLOPramide (REGLAN) 10 MG tablet Take 1 tablet (10 mg total) by mouth every 6 (six) hours as needed for nausea.  . metoprolol succinate (TOPROL-XL) 100 MG 24 hr tablet TAKE 1 TABLET BY MOUTH EVERY DAY  . mirabegron ER (MYRBETRIQ) 50 MG TB24 tablet TAKE 1 TABLET BY MOUTH EVERY DAY  . Multiple Vitamin (MULTIVITAMIN) tablet Take 1 tablet by mouth daily. Centrum silver  . NOVOLOG FLEXPEN 100 UNIT/ML FlexPen   . ONE TOUCH ULTRA TEST test strip   . pantoprazole (PROTONIX) 40 MG tablet TAKE 1 TABLET BY MOUTH EVERY DAY  . Probiotic Product ([place]) CAPS Take 1 capsule by mouth daily.  . simvastatin (ZOCOR) 20 MG tablet TAKE 1 TABLET BY MOUTH EVERYDAY AT BEDTIME  . spironolactone (ALDACTONE) 25 MG tablet Take 25 mg by mouth daily.   Marland Kitchen torsemide (DEMADEX) 20 MG tablet TAKE 1 TABLET BY MOUTH EVERY DAY  . UNABLE TO FIND TAKE 2 TEASPOONS(10MLS) BY MOUTH 4 TIMES A DAY FOR 5 DAYS  . vardenafil (LEVITRA) 20 MG tablet Take 20 mg by mouth as needed for erectile dysfunction.    No current facility-administered medications for this visit. (Other)      REVIEW OF SYSTEMS: ROS    Positive for: Gastrointestinal, Skin, Genitourinary, Musculoskeletal, Endocrine, Eyes, Respiratory   Negative for: Constitutional, Neurological, HENT, Cardiovascular, Psychiatric, Allergic/Imm, Heme/Lymph   Last edited by Matthew Folks, COA on 04/02/2020  3:25  PM. (History)       ALLERGIES Allergies  Allergen Reactions  . Nsaids Other (See Comments)    GI BLEED  . Aspirin Other (See Comments)    STOMACH ULCER  STOMACH ULCER  STOMACH ULCER  STOMACH ULCER  STOMACH ULCER  STOMACH ULCER  STOMACH ULCER   . Other Other (See Comments)    blisters    PAST MEDICAL HISTORY Past Medical History:  Diagnosis Date  . Arthritis    "knees" (01/01/2016)  . Cataract    OS  . CHF (congestive heart failure) ([facility])   . Chronic kidney disease   . Diabetic retinopathy ([facility])    NPDR OU  . Erectile dysfunction   . Glaucoma    OS  .  HTN (hypertension)   . Hyperlipidemia   . Neuromuscular disorder (HCC)    neuropathy  . Obesity   . OSA on CPAP   . Pain of left upper arm   . Restrictive lung disease   . Sensory hearing loss, bilateral   . SOBOE (shortness of breath on exertion) 11/03/15   currently goes to gym  . Type II diabetes mellitus ([facility])    Past Surgical History:  Procedure Laterality Date  . CATARACT EXTRACTION Right 03/23/2020   Dr. Marylynn Pearson  . COLONOSCOPY    . EYE SURGERY Right 03/23/2020   Cat Sx - Dr. Marylynn Pearson  . GLAUCOMA SURGERY Bilateral    "laser"  . JOINT REPLACEMENT    . TOTAL KNEE ARTHROPLASTY Left 11/09/2015   Procedure: LEFT TOTAL KNEE ARTHROPLASTY;  Surgeon: Gaynelle Arabian, MD;  Location: [facility];  Service: Orthopedics;  Laterality: Left;    FAMILY HISTORY Family History  Problem Relation Age of Onset  . Stroke Mother   . Diabetes Mother   . Hypertension Mother   . Hypertension Brother   . Diabetes Brother   . Diabetes Sister     SOCIAL HISTORY Social History   Tobacco Use  . Smoking status: Former Smoker    Packs/day: 1.00    Years: 7.00    Pack years: 7.00    Types: Cigarettes    Quit date: 08/01/1974    Years since quitting: 45.7  . Smokeless tobacco: Never Used  Substance Use Topics  . Alcohol use: No  . Drug use: No         OPHTHALMIC EXAM:  Base Eye Exam    Visual Acuity (Snellen  - Linear)      Right Left   Dist cc HM 20/40 -2   Dist ph cc NI NI   Correction: Glasses       Tonometry (Tonopen, 3:33 PM)      Right Left   Pressure 17 14       Pupils      Dark Light Shape React APD   Right 2 2 Round Minimal None   Left 2 2 Round Minimal None       Visual Fields (Counting fingers)      Left Right    Full    Restrictions  Total superior temporal, inferior temporal, superior nasal, inferior nasal deficiencies       Extraocular Movement      Right Left    Full, Ortho Full, Ortho       Neuro/Psych    Oriented x3: Yes   Mood/Affect: Normal       Dilation    Both eyes: 1.0% Mydriacyl, 2.5% Phenylephrine @ 3:33 PM        Slit Lamp and Fundus Exam    Slit Lamp Exam      Right Left   Lids/Lashes Dermatochalasis - upper lid, Meibomian gland dysfunction Dermatochalasis - upper lid, Ptosis, mild Meibomian gland dysfunction   Conjunctiva/Sclera 3-4+ Injection, sutures intact, SN bleb Mild Melanosis   Cornea Nylon suture at 0900, 2+ Punctate epithelial erosions, diffuse corneal haze and microcystic edema, 3+ Descemet's folds temporally Mild arcus, trace Punctate epithelial erosions   Anterior Chamber Deep, 3-4+ Cell, fibrin strand and reaction superiorly and over pupil, mild hyphema inferiorly -- not fully layered; NO HYPOPYON Deep and quiet   Iris Surgical PI at 0130, laser PI at 1030, moderate dilation Round and moderately dilated   Lens Posterior chamber intraocular lens with pigment deposition  3+ Nuclear sclerosis, 2-3+ Cortical cataract   Vitreous syneresis; hazy view syneresis       Fundus Exam      Right Left   Disc hazy view, mild Pallor, perfused, Sharp rim Pink and Sharp, +cupping   C/D Ratio 0.6 0.75   Macula hazy view, grossly attached Flat, Good foveal reflex, mild  RPE mottling, Focal laser scars temporal macula   Vessels Vascular attenuation Vascular attenuation, mild tortuousity   Periphery hazy view, grossly attached, red reflex Attached            Refraction    Wearing Rx      Sphere Cylinder Axis Add   Right +3.25 +1.50 078 +2.50   Left +4.75 +1.75 148 +2.50   Type: PAL       Manifest Refraction      Sphere Cylinder Axis Dist VA   Right +3.25 +1.50 078 20/HM   Left +4.50 +1.50 150 20/40          IMAGING AND PROCEDURES  Imaging and Procedures for 04/02/2020  OCT, Retina - OU - Both Eyes       Right Eye Quality was poor. Progression has no prior data. Findings include (Only single line scanned obtained, atrophic retina, attached).   Left Eye Quality was good. Central Foveal Thickness: 259. Progression has no prior data. Findings include vitreomacular adhesion , normal foveal contour, no SRF, outer retinal atrophy, no IRF (NFP, no IRF/SRF; ORA temporal macula).   Notes *Images captured and stored on drive  Diagnosis / Impression:  OD: Only single line scanned obtained--atrophic retina, attached OS: NFP, no IRF/SRF; ORA temporal macula  Clinical management:  See below  Abbreviations: NFP - Normal foveal profile. CME - cystoid macular edema. PED - pigment epithelial detachment. IRF - intraretinal fluid. SRF - subretinal fluid. EZ - ellipsoid zone. ERM - epiretinal membrane. ORA - outer retinal atrophy. ORT - outer retinal tubulation. SRHM - subretinal hyper-reflective material. 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 Uveitic glaucoma of right eye, unspecified glaucoma stage  H40.41X0    H20.9   3. Moderate nonproliferative diabetic retinopathy of both eyes without macular edema associated with type 2 diabetes mellitus (HCC)  V47.4512   4. Retinal edema  H35.81 OCT, Retina - OU - Both Eyes  5. Essential hypertension  I10   6. Hypertensive retinopathy of both eyes  H35.033   7. Combined forms of age-related cataract of left eye  H25.812   8. Pseudophakia  Z96.1     1,2. 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 Harlon Flor  - f/u tomorrow for recheck  3,4. 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 Luciana Axe in the past - The incidence, risk factors for progression, natural history  and treatment options for diabetic retinopathy were discussed with patient.   - The need for close monitoring of blood glucose, blood pressure, and serum lipids, avoiding cigarette or any type of tobacco, and the need for long term follow up was also discussed with patient. - exam  - OCT without diabetic macular edema, OU - f/u in 2-3 mos -- DFE/OCT  5,6. Hypertensive retinopathy OU - discussed importance of tight BP control - monitor  7. Mixed Cataract OS - The symptoms of cataract, surgical options, and treatments and risks were discussed with patient. - discussed diagnosis and progression - under the expert care of Dr. [name]  8. Pseudophakia OD  - s/p CE/IOL + trab OD (Dr. [name], 08.23.21)  - IOL in good position  - s/p 5-FU 9.1.21  - inflammation as above   Ophthalmic Meds Ordered this visit:  No orders of the defined types were placed in this encounter.      Return in about 1 day (around 04/03/2020) for f/u anterior uveitis OD, DFE, OCT.  There are no Patient Instructions on file for this visit.   Explained the diagnoses, plan, and follow up with the patient and they expressed understanding.  Patient expressed understanding of the importance of proper follow up care.  This document  serves as a record of services personally performed by Gardiner Sleeper, MD, PhD. It was created on their behalf by Estill Bakes, COT an ophthalmic technician. The creation of this record is the provider's dictation and/or activities during the visit.    Electronically signed by: Estill Bakes, COT 9.2.21 @ 5:09 PM   This document serves as a record of services personally performed by Gardiner Sleeper, MD, PhD. It was created on their behalf by San Jetty. Owens Shark, OA an ophthalmic technician. The creation of this record is the provider's dictation and/or activities during the visit.    Electronically signed by: San Jetty. Owens Shark, [place] 09.02.2021 5:09 PM   Gardiner Sleeper, M.D., Ph.D. Diseases & Surgery of the Retina and Vitreous [facility]  I have reviewed the above documentation for accuracy and completeness, and I agree with the above. Gardiner Sleeper, M.D., Ph.D. 04/02/20 5:09 PM   Abbreviations: M myopia (nearsighted); A astigmatism; H hyperopia (farsighted); P presbyopia; Mrx spectacle prescription;  CTL contact lenses; OD right eye; OS left eye; OU both eyes  XT exotropia; ET esotropia; PEK punctate epithelial keratitis; PEE punctate epithelial erosions; DES dry eye syndrome; MGD meibomian gland dysfunction; ATs artificial tears; PFAT's preservative free artificial tears; [place]rio nuclear sclerotic cataract; PSC posterior subcapsular cataract; ERM epi-retinal membrane; PVD posterior vitreous detachment; RD retinal detachment; DM diabetes mellitus; DR diabetic retinopathy; NPDR non-proliferative diabetic retinopathy; PDR proliferative diabetic retinopathy; CSME clinically significant macular edema; DME diabetic macular edema; dbh dot blot hemorrhages; CWS cotton wool spot; POAG primary open angle glaucoma; C/D cup-to-disc ratio; HVF humphrey visual field; GVF goldmann visual field; OCT optical coherence tomography; IOP intraocular pressure; BRVO Branch retinal vein occlusion; CRVO  central retinal vein occlusion; CRAO central retinal artery occlusion; BRAO branch retinal artery occlusion; RT retinal tear; SB scleral buckle; PPV pars plana vitrectomy; VH Vitreous hemorrhage; PRP panretinal laser photocoagulation; IVK intravitreal kenalog; VMT vitreomacular traction; MH Macular hole;  NVD neovascularization of the disc; NVE neovascularization elsewhere; AREDS age related eye disease study; ARMD age related macular degeneration; POAG primary open angle glaucoma; EBMD epithelial/anterior basement membrane dystrophy; ACIOL anterior chamber intraocular lens; IOL intraocular lens; PCIOL posterior chamber intraocular lens;  Phaco/IOL phacoemulsification with intraocular lens placement; [place] photorefractive keratectomy; LASIK laser assisted in situ keratomileusis; HTN hypertension; DM diabetes mellitus; COPD chronic obstructive pulmonary disease

## 2020-04-03 ENCOUNTER — Other Ambulatory Visit (INDEPENDENT_AMBULATORY_CARE_PROVIDER_SITE_OTHER): Payer: Self-pay | Admitting: Ophthalmology

## 2020-04-03 ENCOUNTER — Ambulatory Visit (INDEPENDENT_AMBULATORY_CARE_PROVIDER_SITE_OTHER): Payer: Medicare PPO | Admitting: Ophthalmology

## 2020-04-03 ENCOUNTER — Encounter (INDEPENDENT_AMBULATORY_CARE_PROVIDER_SITE_OTHER): Payer: Self-pay | Admitting: Ophthalmology

## 2020-04-03 DIAGNOSIS — I10 Essential (primary) hypertension: Secondary | ICD-10-CM

## 2020-04-03 DIAGNOSIS — H209 Unspecified iridocyclitis: Secondary | ICD-10-CM | POA: Diagnosis not present

## 2020-04-03 DIAGNOSIS — H3581 Retinal edema: Secondary | ICD-10-CM | POA: Diagnosis not present

## 2020-04-03 DIAGNOSIS — Z961 Presence of intraocular lens: Secondary | ICD-10-CM

## 2020-04-03 DIAGNOSIS — H4041X0 Glaucoma secondary to eye inflammation, right eye, stage unspecified: Secondary | ICD-10-CM

## 2020-04-03 DIAGNOSIS — E113393 Type 2 diabetes mellitus with moderate nonproliferative diabetic retinopathy without macular edema, bilateral: Secondary | ICD-10-CM | POA: Diagnosis not present

## 2020-04-03 DIAGNOSIS — H35033 Hypertensive retinopathy, bilateral: Secondary | ICD-10-CM

## 2020-04-03 DIAGNOSIS — H25812 Combined forms of age-related cataract, left eye: Secondary | ICD-10-CM

## 2020-04-03 MED ORDER — PROLENSA 0.07 % OP SOLN: 1.0000 [drp] | Freq: Four times a day (QID) | OPHTHALMIC | 3 refills | Status: DC

## 2020-04-03 MED ORDER — PREDNISONE 10 MG PO TABS: 40.0000 mg | ORAL_TABLET | Freq: Every day | ORAL | 0 refills | Status: DC

## 2020-04-03 NOTE — Progress Notes (Signed)
[facility] Note  04/03/2020     CHIEF COMPLAINT Patient presents for Retina Follow Up   HISTORY OF PRESENT ILLNESS: Micheal Morris is a 74 y.o. male who presents to the clinic today for:   HPI    Retina Follow Up    Patient presents with  Other (Anterior uveitis).  Severity is moderate.  Duration of 1 day.  Since onset it is gradually improving.  I, the attending physician,  performed the HPI with the patient and updated documentation appropriately.          Comments    Patient states OD feels better today. Still some throbbing today, light sensitivity improved. Using gtts as instructed.       Last edited by Bernarda Caffey, MD on 04/03/2020  4:54 PM. (History)    pt states his eye is physically feeling better, but he still cannot see  Referring physician: Marylynn Pearson, MD [address] [place],  [place] [address]  HISTORICAL INFORMATION:   Selected notes from the MEDICAL RECORD NUMBER Referred by Dr. 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 atropine 1 % ophthalmic solution Place 1 drop into the right eye 3 (three) times daily.   . bimatoprost (LUMIGAN) 0.01 % SOLN Place 1 drop into the left eye at bedtime.   . brimonidine-timolol (COMBIGAN) 0.2-0.5 % ophthalmic solution Place 1 drop into the left eye 2 (two) times daily.   . prednisoLONE acetate (PRED FORTE) 1 % ophthalmic suspension Place 1 drop into the right eye every hour while awake.   . Bromfenac Sodium (PROLENSA) 0.07 % SOLN Place 1 drop into the right eye 4 (four) times daily.  . RHOPRESSA 0.02 % SOLN Place 1 drop into both eyes every evening.  (Patient not taking: Reported on 04/02/2020)   No current facility-administered medications for this visit. (Ophthalmic Drugs)   Current Outpatient Medications (Other)  Medication Sig  . acetaZOLAMIDE (DIAMOX) 500 MG capsule  Take by mouth.  Marland Kitchen amLODipine-benazepril (LOTREL) 10-20 MG capsule One capsule daily  . amoxicillin (AMOXIL) 500 MG capsule TAKE 4 CAPSULE 1HR PRIOR TO DENTAL APPT  . amoxicillin-clavulanate (AUGMENTIN) 875-125 MG tablet TAKE ONE TABLET BY MOUTH 2 (TWO) TIMES DAILY FOR 7 DAYS.  Marland Kitchen aspirin 81 MG chewable tablet Chew 81 mg by mouth daily.  . BD PEN NEEDLE NANO U/F 32G X 4 MM MISC USE AS DIRECTED 4 TIMES A DAY  . Blood Glucose Monitoring Suppl (ACCU-CHEK GUIDE) w/Device KIT   . docusate sodium (COLACE) 100 MG capsule Take by mouth.  . gabapentin (NEURONTIN) 300 MG capsule Take 2 capsules tid  . HUMALOG KWIKPEN 100 UNIT/ML KiwkPen Inject 0-60 Units into the skin 3 (three) times daily. Per sliding scale three times daily with meals  . insulin glargine (LANTUS) 100 UNIT/ML injection Inject into the skin.  Marland Kitchen JARDIANCE 25 MG TABS tablet Take 25 mg by mouth daily.  . Lancets (ONETOUCH ULTRASOFT) lancets USE AS INSTRUCTED  . linaclotide (LINZESS) 290 MCG CAPS capsule TAKE 1 CAPSULE BY MOUTH EVERY DAY  . liraglutide (VICTOZA) 18 MG/3ML SOPN Inject 0.3 mLs (1.8 mg total) into the skin daily.  . magnesium citrate SOLN Take 162m once. Wait several hours and repeat if needed.  . metoCLOPramide (REGLAN) 10 MG tablet Take 1 tablet (10 mg total) by mouth every 6 (six) hours as needed for nausea.  .Marland Kitchen  metoprolol succinate (TOPROL-XL) 100 MG 24 hr tablet TAKE 1 TABLET BY MOUTH EVERY DAY  . mirabegron ER (MYRBETRIQ) 50 MG TB24 tablet TAKE 1 TABLET BY MOUTH EVERY DAY  . Multiple Vitamin (MULTIVITAMIN) tablet Take 1 tablet by mouth daily. Centrum silver  . NOVOLOG FLEXPEN 100 UNIT/ML FlexPen   . ONE TOUCH ULTRA TEST test strip   . pantoprazole (PROTONIX) 40 MG tablet TAKE 1 TABLET BY MOUTH EVERY DAY  . Probiotic Product ([place]ws) CAPS Take 1 capsule by mouth daily.  . simvastatin (ZOCOR) 20 MG tablet TAKE 1 TABLET BY MOUTH EVERYDAY AT BEDTIME  . spironolactone (ALDACTONE) 25 MG tablet Take 25 mg by  mouth daily.   Marland Kitchen torsemide (DEMADEX) 20 MG tablet TAKE 1 TABLET BY MOUTH EVERY DAY  . UNABLE TO FIND TAKE 2 TEASPOONS(10MLS) BY MOUTH 4 TIMES A DAY FOR 5 DAYS  . vardenafil (LEVITRA) 20 MG tablet Take 20 mg by mouth as needed for erectile dysfunction.   . bacitracin ointment Apply topically 2 (two) times daily. (Patient not taking: Reported on 04/03/2020)  . predniSONE (DELTASONE) 10 MG tablet Take 4 tablets (40 mg total) by mouth daily.   No current facility-administered medications for this visit. (Other)      REVIEW OF SYSTEMS: ROS    Positive for: Gastrointestinal, Skin, Genitourinary, Musculoskeletal, Endocrine, Eyes, Respiratory   Negative for: Constitutional, Neurological, HENT, Cardiovascular, Psychiatric, Allergic/Imm, Heme/Lymph   Last edited by Roselee Nova D, COT on 04/03/2020  1:42 PM. (History)       ALLERGIES Allergies  Allergen Reactions  . Nsaids Other (See Comments)    GI BLEED  . Aspirin Other (See Comments)    STOMACH ULCER  STOMACH ULCER  STOMACH ULCER  STOMACH ULCER  STOMACH ULCER  STOMACH ULCER  STOMACH ULCER   . Other Other (See Comments)    blisters    PAST MEDICAL HISTORY Past Medical History:  Diagnosis Date  . Arthritis    "knees" (01/01/2016)  . Cataract    OS  . CHF (congestive heart failure) ([place])   . Chronic kidney disease   . Diabetic retinopathy ([place])    NPDR OU  . Erectile dysfunction   . Glaucoma    OS  . HTN (hypertension)   . Hyperlipidemia   . Neuromuscular disorder ([facility])    neuropathy  . Obesity   . OSA on CPAP   . Pain of left upper arm   . Restrictive lung disease   . Sensory hearing loss, bilateral   . SOBOE (shortness of breath on exertion) 11/03/15   currently goes to gym  . Type II diabetes mellitus ([place])    Past Surgical History:  Procedure Laterality Date  . CATARACT EXTRACTION Right 03/23/2020   Dr. Marylynn Pearson  . COLONOSCOPY    . EYE SURGERY Right 03/23/2020   Cat Sx - Dr. Marylynn Pearson  . GLAUCOMA  SURGERY Bilateral    "laser"  . JOINT REPLACEMENT    . TOTAL KNEE ARTHROPLASTY Left 11/09/2015   Procedure: LEFT TOTAL KNEE ARTHROPLASTY;  Surgeon: Gaynelle Arabian, MD;  Location: [facility];  Service: Orthopedics;  Laterality: Left;    FAMILY HISTORY Family History  Problem Relation Age of Onset  . Stroke Mother   . Diabetes Mother   . Hypertension Mother   . Hypertension Brother   . Diabetes Brother   . Diabetes Sister     SOCIAL HISTORY Social History   Tobacco Use  . Smoking status: Former Smoker  Packs/day: 1.00    Years: 7.00    Pack years: 7.00    Types: Cigarettes    Quit date: 08/01/1974    Years since quitting: 45.7  . Smokeless tobacco: Never Used  Substance Use Topics  . Alcohol use: No  . Drug use: No         OPHTHALMIC EXAM:  Base Eye Exam    Visual Acuity (Snellen - Linear)      Right Left   [place] HM    Dist cc  20/50   Dist ph cc  20/40   Correction: Glasses       Tonometry (Tonopen, 1:54 PM)      Right Left   Pressure 14 15       Pupils      Dark Light Shape React APD   Right 4 4 Round Minimal None   Left 3 2 Round Slow None  Hazy view OD       Visual Fields (Counting fingers)      Left Right    Full    Restrictions  Total superior temporal, inferior temporal, superior nasal, inferior nasal deficiencies       Extraocular Movement      Right Left    Full, Ortho Full, Ortho       Neuro/Psych    Oriented x3: Yes   Mood/Affect: Normal       Dilation    Both eyes: 1.0% Mydriacyl, 2.5% Phenylephrine @ 1:54 PM        Slit Lamp and Fundus Exam    Slit Lamp Exam      Right Left   Lids/Lashes Dermatochalasis - upper lid, Meibomian gland dysfunction Dermatochalasis - upper lid, Ptosis, mild Meibomian gland dysfunction   Conjunctiva/Sclera 3-4+ Injection, sutures intact, SN bleb Mild Melanosis   Cornea 3-4+ Microcystic edema with bullae, arcus, nylon suture at 0900, Descemet's folds temporally Mild arcus, trace Punctate  epithelial erosions   Anterior Chamber Deep, 4+ cell/flare; fibrin reaction maturing over pupil, mild hyphema inferiorly -- not fully layered; NO HYPOPYON Deep and quiet   [place] PI at 0130, laser PI at 1030, poorly dilated, hazy view Round and moderately dilated   Lens Posterior chamber intraocular lens with pigment deposition 3+ Nuclear sclerosis, 2-3+ Cortical cataract   Vitreous no view syneresis       Fundus Exam      Right Left   Disc No view Pink and Sharp, +cupping   C/D Ratio 0.6 0.75   Macula no view Flat, Good foveal reflex, mild  RPE mottling, Focal laser scars temporal macula   Vessels no view Vascular attenuation, mild tortuousity   Periphery No view Attached           Refraction    Wearing Rx      Sphere Cylinder Axis Add   Right +3.25 +1.50 078 +2.50   Left +4.75 +1.75 148 +2.50   Type: PAL          IMAGING AND PROCEDURES  Imaging and Procedures for 04/03/2020  B-Scan Ultrasound - OD - Right Eye       Findings included posterior vitreous detachment.   Notes **Images stored on drive**  Impression: OD: tr vitreous opacities consistent with PVD; no obvious RT/RD or mass or endophthalmitis        OCT, Retina - OU - Both Eyes       Right Eye Quality was poor. Progression has no prior data. Findings include (No image obtained).  Left Eye Quality was good. Central Foveal Thickness: 269. Progression has been stable. Findings include vitreomacular adhesion , normal foveal contour, no SRF, outer retinal atrophy, no IRF (NFP, no IRF/SRF; ORA temporal macula).   Notes *Images captured and stored on drive  Diagnosis / Impression:  OD: no image obtained OS: NFP, no IRF/SRF; ORA temporal macula  Clinical management:  See below  Abbreviations: NFP - Normal foveal profile. CME - cystoid macular edema. PED - pigment epithelial detachment. IRF - intraretinal fluid. SRF - subretinal fluid. EZ - ellipsoid zone. ERM - epiretinal membrane. ORA - outer  retinal atrophy. ORT - outer retinal tubulation. SRHM - subretinal hyper-reflective material. IRHM - intraretinal hyper-reflective material                 ASSESSMENT/PLAN:    ICD-10-CM   1. Anterior uveitis  H20.9 B-Scan Ultrasound - OD - Right Eye  2. Uveitic glaucoma of right eye, unspecified glaucoma stage  H40.41X0 B-Scan Ultrasound - OD - Right Eye   H20.9   3. Moderate nonproliferative diabetic retinopathy of both eyes without macular edema associated with type 2 diabetes mellitus ([place])  G25.4270   4. Retinal edema  H35.81 OCT, Retina - OU - Both Eyes  5. Essential hypertension  I10   6. Hypertensive retinopathy of both eyes  H35.033   7. Combined forms of age-related cataract of left eye  H25.812   8. Pseudophakia  Z96.1     1,2. 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 WVenetia Maxon - f/u Tuesday, September 7, sooner prn  3,4. 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 RZadie Rhinein the past - The incidence, risk factors for progression, natural history and treatment options for diabetic retinopathy were discussed with patient.   - The need for close monitoring of blood glucose, blood pressure, and serum lipids, avoiding cigarette or any type of tobacco, and the need for long term follow up was also discussed with patient. - exam  - OCT  without diabetic macular edema, OU - f/u in 2-3 mos -- DFE/OCT  5,6. Hypertensive retinopathy OU - discussed importance of tight BP control - monitor 7. Mixed Cataract OS - The symptoms of cataract, surgical options, and treatments and risks were discussed with patient. - discussed diagnosis and progression - under the expert care of Dr. WVenetia Maxon 8. Pseudophakia OD  - s/p CE/IOL + trab OD (Dr. WVenetia Maxon 08.23.21)  - IOL in good position  - s/p 5-FU 9.1.21  - inflammation as above   Ophthalmic Meds Ordered this visit:  Meds ordered this encounter  Medications  . Bromfenac Sodium (PROLENSA) 0.07 % SOLN    Sig: Place 1 drop into the right eye 4 (four) times daily.    Dispense:  3 mL  Refill:  3  . predniSONE (DELTASONE) 10 MG tablet    Sig: Take 4 tablets (40 mg total) by mouth daily.    Dispense:  120 tablet    Refill:  0       Return in about 4 days (around 04/07/2020) for f/u anterior uveitis OD, DFE, OCT.  There are no Patient Instructions on file for this visit.   Explained the diagnoses, plan, and follow up with the patient and they expressed understanding.  Patient expressed understanding of the importance of proper follow up care.  This document serves as a record of services personally performed by Gardiner Sleeper, MD, PhD. It was created on their behalf by San Jetty. Owens Shark, OA an ophthalmic technician. The creation of this record is the provider's dictation and/or activities during the visit.    Electronically signed by: San Jetty. Owens Shark, [place] 09.03.2021 5:00 PM   Gardiner Sleeper, M.D., Ph.D. Diseases & Surgery of the Retina and Vitreous [facility]  I have reviewed the above documentation for accuracy and completeness, and I agree with the above. Gardiner Sleeper, M.D., Ph.D. 04/03/20 5:00 PM   Abbreviations: M myopia (nearsighted); A astigmatism; H hyperopia (farsighted); P presbyopia; Mrx spectacle prescription;  CTL contact lenses; OD  right eye; OS left eye; OU both eyes  XT exotropia; ET esotropia; PEK punctate epithelial keratitis; PEE punctate epithelial erosions; DES dry eye syndrome; MGD meibomian gland dysfunction; ATs artificial tears; PFAT's preservative free artificial tears; [place] nuclear sclerotic cataract; PSC posterior subcapsular cataract; ERM epi-retinal membrane; PVD posterior vitreous detachment; RD retinal detachment; DM diabetes mellitus; DR diabetic retinopathy; NPDR non-proliferative diabetic retinopathy; PDR proliferative diabetic retinopathy; CSME clinically significant macular edema; DME diabetic macular edema; dbh dot blot hemorrhages; CWS cotton wool spot; POAG primary open angle glaucoma; C/D cup-to-disc ratio; HVF humphrey visual field; GVF goldmann visual field; OCT optical coherence tomography; IOP intraocular pressure; BRVO Branch retinal vein occlusion; CRVO central retinal vein occlusion; CRAO central retinal artery occlusion; BRAO branch retinal artery occlusion; RT retinal tear; SB scleral buckle; PPV pars plana vitrectomy; VH Vitreous hemorrhage; PRP panretinal laser photocoagulation; IVK intravitreal kenalog; VMT vitreomacular traction; MH Macular hole;  NVD neovascularization of the disc; NVE neovascularization elsewhere; AREDS age related eye disease study; ARMD age related macular degeneration; POAG primary open angle glaucoma; EBMD epithelial/anterior basement membrane dystrophy; ACIOL anterior chamber intraocular lens; IOL intraocular lens; PCIOL posterior chamber intraocular lens; Phaco/IOL phacoemulsification with intraocular lens placement; [place] photorefractive keratectomy; LASIK laser assisted in situ keratomileusis; HTN hypertension; DM diabetes mellitus; COPD chronic obstructive pulmonary disease

## 2020-04-07 ENCOUNTER — Other Ambulatory Visit: Payer: Self-pay

## 2020-04-07 ENCOUNTER — Encounter (INDEPENDENT_AMBULATORY_CARE_PROVIDER_SITE_OTHER): Payer: Self-pay | Admitting: Ophthalmology

## 2020-04-07 ENCOUNTER — Ambulatory Visit (INDEPENDENT_AMBULATORY_CARE_PROVIDER_SITE_OTHER): Payer: Medicare PPO | Admitting: Ophthalmology

## 2020-04-07 DIAGNOSIS — H3581 Retinal edema: Secondary | ICD-10-CM

## 2020-04-07 DIAGNOSIS — E113393 Type 2 diabetes mellitus with moderate nonproliferative diabetic retinopathy without macular edema, bilateral: Secondary | ICD-10-CM | POA: Diagnosis not present

## 2020-04-07 DIAGNOSIS — Z961 Presence of intraocular lens: Secondary | ICD-10-CM

## 2020-04-07 DIAGNOSIS — H209 Unspecified iridocyclitis: Secondary | ICD-10-CM

## 2020-04-07 DIAGNOSIS — H4041X0 Glaucoma secondary to eye inflammation, right eye, stage unspecified: Secondary | ICD-10-CM

## 2020-04-07 DIAGNOSIS — I10 Essential (primary) hypertension: Secondary | ICD-10-CM

## 2020-04-07 DIAGNOSIS — H25812 Combined forms of age-related cataract, left eye: Secondary | ICD-10-CM

## 2020-04-07 DIAGNOSIS — H35033 Hypertensive retinopathy, bilateral: Secondary | ICD-10-CM

## 2020-04-07 MED ORDER — DUREZOL 0.05 % OP EMUL: 1.0000 [drp] | OPHTHALMIC | 1 refills | Status: DC

## 2020-04-07 NOTE — Progress Notes (Deleted)
[facility] Note  04/07/2020     CHIEF COMPLAINT Patient presents for No chief complaint on file.   HISTORY OF PRESENT ILLNESS: Micheal Morris is a 74 y.o. male who presents to the clinic today for:     Referring physician: Chesley Noon, MD [place],  [place] [id number]  HISTORICAL INFORMATION:   Selected notes from the MEDICAL RECORD NUMBER Referred by Dr. 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 atropine 1 % ophthalmic solution Place 1 drop into the right eye 3 (three) times daily.   . bimatoprost (LUMIGAN) 0.01 % SOLN Place 1 drop into the left eye at bedtime.   . brimonidine-timolol (COMBIGAN) 0.2-0.5 % ophthalmic solution Place 1 drop into the left eye 2 (two) times daily.   . Bromfenac Sodium (PROLENSA) 0.07 % SOLN Place 1 drop into the right eye 4 (four) times daily.  . prednisoLONE acetate (PRED FORTE) 1 % ophthalmic suspension Place 1 drop into the right eye every hour while awake.   Marland Kitchen RHOPRESSA 0.02 % SOLN Place 1 drop into both eyes every evening.  (Patient not taking: Reported on 04/02/2020)   No current facility-administered medications for this visit. (Ophthalmic Drugs)   Current Outpatient Medications (Other)  Medication Sig  . acetaZOLAMIDE (DIAMOX) 500 MG capsule Take by mouth.  Marland Kitchen amLODipine-benazepril (LOTREL) 10-20 MG capsule One capsule daily  . amoxicillin (AMOXIL) 500 MG capsule TAKE 4 CAPSULE 1HR PRIOR TO DENTAL APPT  . amoxicillin-clavulanate (AUGMENTIN) 875-125 MG tablet TAKE ONE TABLET BY MOUTH 2 (TWO) TIMES DAILY FOR 7 DAYS.  Marland Kitchen aspirin 81 MG chewable tablet Chew 81 mg by mouth daily.  . bacitracin ointment Apply topically 2 (two) times daily. (Patient not taking: Reported on 04/03/2020)  . BD PEN NEEDLE NANO U/F 32G X 4 MM MISC USE AS DIRECTED 4 TIMES A DAY  . Blood Glucose Monitoring Suppl  (ACCU-CHEK GUIDE) w/Device KIT   . docusate sodium (COLACE) 100 MG capsule Take by mouth.  . gabapentin (NEURONTIN) 300 MG capsule Take 2 capsules tid  . HUMALOG KWIKPEN 100 UNIT/ML KiwkPen Inject 0-60 Units into the skin 3 (three) times daily. Per sliding scale three times daily with meals  . insulin glargine (LANTUS) 100 UNIT/ML injection Inject into the skin.  Marland Kitchen JARDIANCE 25 MG TABS tablet Take 25 mg by mouth daily.  . Lancets (ONETOUCH ULTRASOFT) lancets USE AS INSTRUCTED  . linaclotide (LINZESS) 290 MCG CAPS capsule TAKE 1 CAPSULE BY MOUTH EVERY DAY  . liraglutide (VICTOZA) 18 MG/3ML SOPN Inject 0.3 mLs (1.8 mg total) into the skin daily.  . magnesium citrate SOLN Take 179m once. Wait several hours and repeat if needed.  . metoCLOPramide (REGLAN) 10 MG tablet Take 1 tablet (10 mg total) by mouth every 6 (six) hours as needed for nausea.  . metoprolol succinate (TOPROL-XL) 100 MG 24 hr tablet TAKE 1 TABLET BY MOUTH EVERY DAY  . mirabegron ER (MYRBETRIQ) 50 MG TB24 tablet TAKE 1 TABLET BY MOUTH EVERY DAY  . Multiple Vitamin (MULTIVITAMIN) tablet Take 1 tablet by mouth daily. Centrum silver  . NOVOLOG FLEXPEN 100 UNIT/ML FlexPen   . ONE TOUCH ULTRA TEST test strip   . pantoprazole (PROTONIX) 40 MG tablet TAKE 1 TABLET BY MOUTH EVERY DAY  . predniSONE (DELTASONE) 10 MG tablet Take 4 tablets (40 mg total) by mouth daily.  . Probiotic  Product (PHILLIPS COLON HEALTH) CAPS Take 1 capsule by mouth daily.  . simvastatin (ZOCOR) 20 MG tablet TAKE 1 TABLET BY MOUTH EVERYDAY AT BEDTIME  . spironolactone (ALDACTONE) 25 MG tablet Take 25 mg by mouth daily.   . torsemide (DEMADEX) 20 MG tablet TAKE 1 TABLET BY MOUTH EVERY DAY  . UNABLE TO FIND TAKE 2 TEASPOONS(10MLS) BY MOUTH 4 TIMES A DAY FOR 5 DAYS  . vardenafil (LEVITRA) 20 MG tablet Take 20 mg by mouth as needed for erectile dysfunction.    No current facility-administered medications for this visit. (Other)      REVIEW OF  SYSTEMS:    ALLERGIES Allergies  Allergen Reactions  . Nsaids Other (See Comments)    GI BLEED  . Aspirin Other (See Comments)    STOMACH ULCER  STOMACH ULCER  STOMACH ULCER  STOMACH ULCER  STOMACH ULCER  STOMACH ULCER  STOMACH ULCER   . Other Other (See Comments)    blisters    PAST MEDICAL HISTORY Past Medical History:  Diagnosis Date  . Arthritis    "knees" (01/01/2016)  . Cataract    OS  . CHF (congestive heart failure) (HCC)   . Chronic kidney disease   . Diabetic retinopathy (HCC)    NPDR OU  . Erectile dysfunction   . Glaucoma    OS  . HTN (hypertension)   . Hyperlipidemia   . Neuromuscular disorder (HCC)    neuropathy  . Obesity   . OSA on CPAP   . Pain of left upper arm   . Restrictive lung disease   . Sensory hearing loss, bilateral   . SOBOE (shortness of breath on exertion) 11/03/15   currently goes to gym  . Type II diabetes mellitus (HCC)    Past Surgical History:  Procedure Laterality Date  . CATARACT EXTRACTION Right 03/23/2020   Dr. Roy Whitaker  . COLONOSCOPY    . EYE SURGERY Right 03/23/2020   Cat Sx - Dr. Roy Whitaker  . GLAUCOMA SURGERY Bilateral    "laser"  . JOINT REPLACEMENT    . TOTAL KNEE ARTHROPLASTY Left 11/09/2015   Procedure: LEFT TOTAL KNEE ARTHROPLASTY;  Surgeon: Frank Aluisio, MD;  Location: [facility];  Service: Orthopedics;  Laterality: Left;    FAMILY HISTORY Family History  Problem Relation Age of Onset  . Stroke Mother   . Diabetes Mother   . Hypertension Mother   . Hypertension Brother   . Diabetes Brother   . Diabetes Sister     SOCIAL HISTORY Social History   Tobacco Use  . Smoking status: Former Smoker    Packs/day: 1.00    Years: 7.00    Pack years: 7.00    Types: Cigarettes    Quit date: 08/01/1974    Years since quitting: 45.7  . Smokeless tobacco: Never Used  Substance Use Topics  . Alcohol use: No  . Drug use: No         OPHTHALMIC EXAM:  Not recorded     IMAGING AND PROCEDURES   Imaging and Procedures for 04/07/2020           ASSESSMENT/PLAN:  No diagnosis found.  1,2. 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 Whitaker  - f/u Tuesday, September 7, sooner prn  3,4. 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 Rankin in the past - The incidence, risk factors for progression, natural history and treatment options for diabetic retinopathy were discussed with patient.   - The need for close monitoring of blood glucose, blood pressure, and serum lipids, avoiding cigarette or any type of tobacco, and the need for long term follow up was also discussed with patient. - exam  - OCT without diabetic macular edema, OU - f/u in 2-3 mos -- DFE/OCT  5,6. Hypertensive retinopathy OU - discussed importance of tight BP control - monitor 7. Mixed Cataract OS - The symptoms of cataract, surgical options, and treatments and risks were discussed with patient. - discussed diagnosis and progression - under the expert care of Dr. Whitaker  8. Pseudophakia OD  - s/p CE/IOL + trab OD (Dr. Whitaker, 08.23.21)  - IOL in good position  - s/p 5-FU 9.1.21  - inflammation as above   Ophthalmic Meds Ordered this visit:  No orders of the defined types were placed in this encounter.      No follow-ups on file.  There are no Patient  Instructions on file for this visit.   Explained the diagnoses, plan, and follow up with the patient and they expressed understanding.  Patient expressed understanding of the importance of proper follow up care.  This document serves as a record of services personally performed by Brian G. Zamora, MD, PhD. It was created on their behalf by Andrew Baxley, COT an ophthalmic technician. The creation of this record is the provider's dictation and/or activities during the visit.    Electronically signed by: Andrew Baxley, COT 9.7.21 @ 7:58 AM  Abbreviations: M myopia (nearsighted); A astigmatism; H hyperopia (farsighted); P presbyopia; Mrx spectacle prescription;  CTL contact lenses; OD right eye; OS left eye; OU both eyes  XT exotropia; ET esotropia; PEK punctate epithelial keratitis; PEE punctate epithelial erosions; DES dry eye syndrome; MGD meibomian gland dysfunction; ATs artificial tears; PFAT's preservative free artificial tears; NSC nuclear sclerotic cataract; PSC posterior subcapsular cataract; ERM epi-retinal membrane; PVD posterior vitreous detachment; RD retinal detachment; DM diabetes mellitus; DR diabetic retinopathy; NPDR non-proliferative diabetic retinopathy; PDR proliferative diabetic retinopathy; CSME clinically significant macular edema; DME diabetic macular edema; dbh dot blot hemorrhages; CWS cotton wool spot; POAG primary open angle glaucoma; C/D cup-to-disc ratio; HVF humphrey visual field; GVF goldmann visual field; OCT optical coherence tomography; IOP intraocular pressure; BRVO Branch retinal vein occlusion; CRVO central retinal vein occlusion; CRAO central retinal artery occlusion; BRAO branch retinal artery occlusion; RT retinal tear; SB scleral buckle; PPV pars plana vitrectomy; VH Vitreous hemorrhage; PRP panretinal laser photocoagulation; IVK intravitreal kenalog; VMT vitreomacular traction; MH Macular hole;  NVD neovascularization of the disc; NVE neovascularization elsewhere;  AREDS age related eye disease study; ARMD age related macular degeneration; POAG primary open angle glaucoma; EBMD epithelial/anterior basement membrane dystrophy; ACIOL anterior chamber intraocular lens; IOL intraocular lens; PCIOL posterior chamber intraocular lens; Phaco/IOL phacoemulsification with intraocular lens placement; PRK photorefractive keratectomy; LASIK laser assisted in   situ keratomileusis; HTN hypertension; DM diabetes mellitus; COPD chronic obstructive pulmonary disease 

## 2020-04-07 NOTE — Progress Notes (Signed)
[facility] Note  04/07/2020     CHIEF COMPLAINT Patient presents for Retina Follow Up   HISTORY OF PRESENT ILLNESS: Micheal Morris is a 74 y.o. male who presents to the clinic today for:   HPI    Retina Follow Up    Patient presents with  Other.  In right eye.  This started 1 week ago.  Severity is severe.  Duration of 4 days.  Since onset it is stable.  I, the attending physician,  performed the HPI with the patient and updated documentation appropriately.          Comments    74 y/o male pt here for 4 day f/u for anterior uveitis OD.  No change in [place] OU.  Denies pain, FOL, floaters.  Is still a bit photophobic OD.  Ketorolac QID OD PF Q1H OD PO Prednisone 74m/daily Atropine TID OD Ofloxacin QID OD Combigan BID OS [place] QHS OS       Last edited by Micheal Caffey MD on 04/07/2020  1:57 PM. (History)    pt states his eye is a little irritated, but no real pain, he states his vision has not improved, he is taking 462mpo prednisone since Saturday, he has not noticed any side effects  Referring physician: WhMarylynn PearsonMDClarkstonRBruce [place] [id number] INFORMATION:   Selected notes from the MEDICAL RECORD NUMBER Referred by Dr. RoMarylynn Morris.2.21 for eval of possible endophthalmitis OD   CURRENT MEDICATIONS: Current Outpatient Medications (Ophthalmic Drugs)  Medication Sig  . atropine 1 % ophthalmic solution Place 1 drop into the right eye 3 (three) times daily.   . bimatoprost (LUMIGAN) 0.01 % SOLN Place 1 drop into the left eye at bedtime.   . brimonidine-timolol (COMBIGAN) 0.2-0.5 % ophthalmic solution Place 1 drop into the left eye 2 (two) times daily.   . Marland Kitchenetorolac (ACULAR) 0.5 % ophthalmic solution   . prednisoLONE acetate (PRED FORTE) 1 % ophthalmic suspension Place 1 drop into the right eye every hour while awake.   . Bromfenac Sodium (PROLENSA) 0.07 % SOLN Place 1 drop into the right eye 4 (four)  times daily. (Patient not taking: Reported on 04/07/2020)  . Difluprednate (DUREZOL) 0.05 % EMUL Place 1 drop into the right eye every hour while awake.  . Marland KitchenHOPRESSA 0.02 % SOLN Place 1 drop into both eyes every evening.  (Patient not taking: Reported on 04/07/2020)   No current facility-administered medications for this visit. (Ophthalmic Drugs)   Current Outpatient Medications (Other)  Medication Sig  . amLODipine-benazepril (LOTREL) 10-20 MG capsule One capsule daily  . amoxicillin (AMOXIL) 500 MG capsule TAKE 4 CAPSULE 1HR PRIOR TO DENTAL APPT  . amoxicillin-clavulanate (AUGMENTIN) 875-125 MG tablet TAKE ONE TABLET BY MOUTH 2 (TWO) TIMES DAILY FOR 7 DAYS.  . Marland Kitchenspirin 81 MG chewable tablet Chew 81 mg by mouth daily.  . bacitracin ointment Apply topically 2 (two) times daily.  . BD PEN NEEDLE NANO U/F 32G X 4 MM MISC USE AS DIRECTED 4 TIMES A DAY  . Blood Glucose Monitoring Suppl (ACCU-CHEK GUIDE) w/Device KIT   . docusate sodium (COLACE) 100 MG capsule Take by mouth.  . gabapentin (NEURONTIN) 300 MG capsule Take 2 capsules tid  . HUMALOG KWIKPEN 100 UNIT/ML KiwkPen Inject 0-60 Units into the skin 3 (three) times daily. Per sliding scale three times daily with meals  . insulin glargine (LANTUS) 100 UNIT/ML injection Inject  into the skin.  Marland Kitchen JARDIANCE 25 MG TABS tablet Take 25 mg by mouth daily.  . Lancets (ONETOUCH ULTRASOFT) lancets USE AS INSTRUCTED  . linaclotide (LINZESS) 290 MCG CAPS capsule TAKE 1 CAPSULE BY MOUTH EVERY DAY  . liraglutide (VICTOZA) 18 MG/3ML SOPN Inject 0.3 mLs (1.8 mg total) into the skin daily.  . magnesium citrate SOLN Take 15m once. Wait several hours and repeat if needed.  . metoCLOPramide (REGLAN) 10 MG tablet Take 1 tablet (10 mg total) by mouth every 6 (six) hours as needed for nausea.  . metoprolol succinate (TOPROL-XL) 100 MG 24 hr tablet TAKE 1 TABLET BY MOUTH EVERY DAY  . mirabegron ER (MYRBETRIQ) 50 MG TB24 tablet TAKE 1 TABLET BY MOUTH EVERY DAY  .  Multiple Vitamin (MULTIVITAMIN) tablet Take 1 tablet by mouth daily. Centrum silver  . NOVOLOG FLEXPEN 100 UNIT/ML FlexPen   . ONE TOUCH ULTRA TEST test strip   . pantoprazole (PROTONIX) 40 MG tablet TAKE 1 TABLET BY MOUTH EVERY DAY  . predniSONE (DELTASONE) 10 MG tablet Take 4 tablets (40 mg total) by mouth daily.  . Probiotic Product ([place] CAPS Take 1 capsule by mouth daily.  . simvastatin (ZOCOR) 20 MG tablet TAKE 1 TABLET BY MOUTH EVERYDAY AT BEDTIME  . spironolactone (ALDACTONE) 25 MG tablet Take 25 mg by mouth daily.   .Marland Kitchentorsemide (DEMADEX) 20 MG tablet TAKE 1 TABLET BY MOUTH EVERY DAY  . UNABLE TO FIND TAKE 2 TEASPOONS(10MLS) BY MOUTH 4 TIMES A DAY FOR 5 DAYS  . vardenafil (LEVITRA) 20 MG tablet Take 20 mg by mouth as needed for erectile dysfunction.   .Marland KitchenacetaZOLAMIDE (DIAMOX) 500 MG capsule Take by mouth. (Patient not taking: Reported on 04/07/2020)   No current facility-administered medications for this visit. (Other)      REVIEW OF SYSTEMS: ROS    Positive for: Gastrointestinal, Skin, Genitourinary, Endocrine, Eyes, Respiratory   Negative for: Constitutional, Neurological, Musculoskeletal, HENT, Cardiovascular, Psychiatric, Allergic/Imm, Heme/Lymph   Last edited by BMatthew Morris COA on 04/07/2020  8:07 AM. (History)       ALLERGIES Allergies  Allergen Reactions  . Nsaids Other (See Comments)    GI BLEED  . Aspirin Other (See Comments)    STOMACH ULCER  STOMACH ULCER  STOMACH ULCER  STOMACH ULCER  STOMACH ULCER  STOMACH ULCER  STOMACH ULCER   . Other Other (See Comments)    blisters    PAST MEDICAL HISTORY Past Medical History:  Diagnosis Date  . Arthritis    "knees" (01/01/2016)  . Cataract    OS  . CHF ([place]estive heart failure) ([place]   . Chronic kidney disease   . Diabetic retinopathy ([place]    NPDR OU  . Erectile dysfunction   . Glaucoma    OS  . HTN (hypertension)   . Hyperlipidemia   . Neuromuscular disorder (HCC)     neuropathy  . Obesity   . OSA on CPAP   . Pain of left upper arm   . Restrictive lung disease   . Sensory hearing loss, bilateral   . SOBOE (shortness of breath on exertion) 11/03/15   currently goes to gym  . Type II diabetes mellitus ([facility]    Past Surgical History:  Procedure Laterality Date  . CATARACT EXTRACTION Right 03/23/2020   Dr. RMarylynn Morris . COLONOSCOPY    . EYE SURGERY Right 03/23/2020   Cat Sx - Dr. RMarylynn Morris . GLAUCOMA SURGERY Bilateral    "  laser"  . JOINT REPLACEMENT    . TOTAL KNEE ARTHROPLASTY Left 11/09/2015   Procedure: LEFT TOTAL KNEE ARTHROPLASTY;  Surgeon: Gaynelle Arabian, MD;  Location: [facility];  Service: Orthopedics;  Laterality: Left;    FAMILY HISTORY Family History  Problem Relation Age of Onset  . Stroke Mother   . Diabetes Mother   . Hypertension Mother   . Hypertension Brother   . Diabetes Brother   . Diabetes Sister     SOCIAL HISTORY Social History   Tobacco Use  . Smoking status: Former Smoker    Packs/day: 1.00    Years: 7.00    Pack years: 7.00    Types: Cigarettes    Quit date: 08/01/1974    Years since quitting: 45.7  . Smokeless tobacco: Never Used  Substance Use Topics  . Alcohol use: No  . Drug use: No         OPHTHALMIC EXAM:  Base Eye Exam    Visual Acuity (Snellen - Linear)      Right Left   Dist cc HM 20/50 -2   Dist ph cc NI 20/40 -2   Correction: Glasses       Tonometry (Tonopen, 8:12 AM)      Right Left   Pressure 13 14       Pupils      Dark Light Shape React APD   Right 3 3 Round Minimal None   Left 3 2 Round Slow None       Visual Fields (Counting fingers)      Left Right    Full    Restrictions  Total superior temporal, inferior temporal, superior nasal, inferior nasal deficiencies       Extraocular Movement      Right Left    Full, Ortho Full, Ortho       Neuro/Psych    Oriented x3: Yes   Mood/Affect: Normal       Dilation    Both eyes: 1.0% Mydriacyl, 2.5% Phenylephrine @  8:12 AM        Slit Lamp and Fundus Exam    Slit Lamp Exam      Right Left   Lids/Lashes Dermatochalasis - upper lid, Meibomian gland dysfunction Dermatochalasis - upper lid, Ptosis, mild Meibomian gland dysfunction   Conjunctiva/Sclera trace Injection, sutures intact, SN bleb with 1+injection Mild Melanosis   Cornea 3-4+ Microcystic edema with bullae - improving, arcus, nylon suture at 0900, 3+Descemet's folds temporally, mild haze improving, Mild arcus, trace Punctate epithelial erosions   Anterior Chamber Deep, 4+ cell/flare - clearing superiorly above fibrin; fibrin reaction consolidating and settling inferiorly, mild hyphema inferiorly -- not fully layered; NO HYPOPYON Deep and quiet   [facility] PI at 0130, laser PI at 1030, poorly dilated, hazy view Round and moderately dilated   Lens Posterior chamber intraocular lens with pigment deposition 3+ Nuclear sclerosis, 2-3+ Cortical cataract   Vitreous hazy view from [facility] fibrin syneresis       Fundus Exam      Right Left   Disc Very hazy view, perfused, no details Pink and Sharp, +cupping   C/D Ratio 0.6 0.75   Macula no view Flat, Good foveal reflex, mild  RPE mottling, Focal laser scars temporal macula   Vessels no view Vascular attenuation, mild tortuousity   Periphery No view Attached             IMAGING AND PROCEDURES  Imaging and Procedures for 04/07/2020  OCT, Retina - OU -  Both Eyes       Right Eye Quality was poor. Progression has no prior data. Findings include (No image obtained).   Left Eye Quality was good. Central Foveal Thickness: 255. Progression has been stable. Findings include vitreomacular adhesion , normal foveal contour, no SRF, outer retinal atrophy, no IRF (NFP, no IRF/SRF; ORA temporal macula).   Notes *Images captured and stored on drive  Diagnosis / Impression:  OD: no image obtained OS: NFP, no IRF/SRF; ORA temporal macula  Clinical management:  See below  Abbreviations: NFP - Normal  foveal profile. CME - cystoid macular edema. PED - pigment epithelial detachment. IRF - intraretinal fluid. SRF - subretinal fluid. EZ - ellipsoid zone. ERM - epiretinal membrane. ORA - outer retinal atrophy. ORT - outer retinal tubulation. SRHM - subretinal hyper-reflective material. 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 Uveitic glaucoma of right eye, unspecified glaucoma stage  H40.41X0    H20.9   3. Moderate nonproliferative diabetic retinopathy of both eyes without macular edema associated with type 2 diabetes mellitus ([place])  E70.3500   4. Retinal edema  H35.81 OCT, Retina - OU - Both Eyes  5. Essential hypertension  I10   6. Hypertensive retinopathy of both eyes  H35.033   7. Combined forms of age-related cataract of left eye  H25.812   8. Pseudophakia  Z96.1     1,2. 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  - pt with history of iritis and uveitic glaucoma OD  - s/p phaco/IOL + trab OD on 08.23.21 ([place])  - acute flare of iritis following 5-FU injection 09.01.21  - today, pt reports subjective improvement in eye pain and photophobia, vision remains blurry  - exam shows improved conj injection; microcystic corneal edema w/ bullae and haze - improving; 4+ AC cell/flare w/ maturing fibrin rxn and persistent hyphema -- NO HYPOPYON TODAY; pigment on IOL  - BCVA HM  - IOP 14  - continue Prolensa QID OD  - continue PF Q1H OD  - continue po Prednisone 73m/daily  - atropine TID OD, besivance/ofloxacin QID OD per Dr. WVenetia Maxon - f/u Thursday, September 9, sooner prn  3,4. 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 RZadie Rhinein the past - The incidence, risk factors for progression, natural history and treatment options for diabetic retinopathy were discussed with patient.   - The need for close monitoring of blood glucose, blood  pressure, and serum lipids, avoiding cigarette or any type of tobacco, and the need for long term follow up was also discussed with patient. - exam  - OCT without diabetic macular edema, OU - f/u in 2-3 mos -- DFE/OCT  5,6. Hypertensive retinopathy OU - discussed importance of tight BP control - monitor 7. Mixed Cataract OS - The symptoms of cataract, surgical options, and treatments and risks were discussed with patient. - discussed diagnosis and progression - under the expert care of Dr. WVenetia Maxon 8. Pseudophakia OD  - s/p CE/IOL + trab OD (Dr. WVenetia Maxon 08.23.21)  - IOL in good position  - s/p 5-FU 09.01.21  - inflammation as above   Ophthalmic Meds Ordered this visit:  Meds ordered this encounter  Medications  . Difluprednate (DUREZOL) 0.05 % EMUL    Sig: Place 1 drop into the right eye every  hour while awake.    Dispense:  15 mL    Refill:  1       Return in about 2 days (around 04/09/2020) for f/u anterior uveitis OD, DFE, OCT.  There are no Patient Instructions on file for this visit.   Explained the diagnoses, plan, and follow up with the patient and they expressed understanding.  Patient expressed understanding of the importance of proper follow up care.  This document serves as a record of services personally performed by Gardiner Sleeper, MD, PhD. It was created on their behalf by San Jetty. Owens Shark, OA an ophthalmic technician. The creation of this record is the provider's dictation and/or activities during the visit.    Electronically signed by: San Jetty. Owens Shark, [place] 09.07.2021 10:30 PM  Gardiner Sleeper, M.D., Ph.D. Diseases & Surgery of the Retina and Vitreous [facility]  I have reviewed the above documentation for accuracy and completeness, and I agree with the above. Gardiner Sleeper, M.D., Ph.D. 04/07/20 10:32 PM   Abbreviations: M myopia (nearsighted); A astigmatism; H hyperopia (farsighted); P presbyopia; Mrx spectacle prescription;   CTL contact lenses; OD right eye; OS left eye; OU both eyes  XT exotropia; ET esotropia; PEK punctate epithelial keratitis; PEE punctate epithelial erosions; DES dry eye syndrome; MGD meibomian gland dysfunction; ATs artificial tears; PFAT's preservative free artificial tears; [place] nuclear sclerotic cataract; PSC posterior subcapsular cataract; ERM epi-retinal membrane; PVD posterior vitreous detachment; RD retinal detachment; DM diabetes mellitus; DR diabetic retinopathy; NPDR non-proliferative diabetic retinopathy; PDR proliferative diabetic retinopathy; CSME clinically significant macular edema; DME diabetic macular edema; dbh dot blot hemorrhages; CWS cotton wool spot; POAG primary open angle glaucoma; C/D cup-to-disc ratio; HVF humphrey visual field; GVF goldmann visual field; OCT optical coherence tomography; IOP intraocular pressure; BRVO Branch retinal vein occlusion; CRVO central retinal vein occlusion; CRAO central retinal artery occlusion; BRAO branch retinal artery occlusion; RT retinal tear; SB scleral buckle; PPV pars plana vitrectomy; VH Vitreous hemorrhage; PRP panretinal laser photocoagulation; IVK intravitreal kenalog; VMT vitreomacular traction; MH Macular hole;  NVD neovascularization of the disc; NVE neovascularization elsewhere; AREDS age related eye disease study; ARMD age related macular degeneration; POAG primary open angle glaucoma; EBMD epithelial/anterior basement membrane dystrophy; ACIOL anterior chamber intraocular lens; IOL intraocular lens; PCIOL posterior chamber intraocular lens; Phaco/IOL phacoemulsification with intraocular lens placement; [place] photorefractive keratectomy; LASIK laser assisted in situ keratomileusis; HTN hypertension; DM diabetes mellitus; COPD chronic obstructive pulmonary disease

## 2020-04-08 NOTE — Progress Notes (Signed)
[facility] Note  04/09/2020     CHIEF COMPLAINT Patient presents for Retina Follow Up   HISTORY OF PRESENT ILLNESS: Micheal Morris is a 74 y.o. male who presents to the clinic today for:   HPI    Retina Follow Up    Patient presents with  Other.  In right eye.  This started days ago.  Severity is moderate.  Duration of days.  Since onset it is stable.  I, the attending physician,  performed the HPI with the patient and updated documentation appropriately.          Comments    Pt states vision is still blurry OD.  Patient complains of irritation OD and light sensitivity OD.  Pt is currently using: Prolensa QID OD Durezol Q1H OD Atropine TID OD Ofloxacin QID OD       Last edited by Bernarda Caffey, MD on 04/09/2020 11:08 AM. (History)    pt states he saw Dr. Venetia Morris yesterday and states he thought the bleb looked okay, Dr. Venetia Morris did not change any of his drops, he has an appt to see Dr. Venetia Morris next Weds, he is using Prolensa QID, Durezol QHS and taking po pred  Referring physician: Marylynn Pearson, MD [address] [place],  [place] [address]  HISTORICAL INFORMATION:   Selected notes from the MEDICAL RECORD NUMBER Referred by Dr. Marylynn Morris 9.2.21 for eval of possible endophthalmitis OD   CURRENT MEDICATIONS: Current Outpatient Medications (Ophthalmic Drugs)  Medication Sig  . atropine 1 % ophthalmic solution Place 1 drop into the right eye 3 (three) times daily.   . bimatoprost (LUMIGAN) 0.01 % SOLN Place 1 drop into the left eye at bedtime.   . brimonidine-timolol (COMBIGAN) 0.2-0.5 % ophthalmic solution Place 1 drop into the left eye 2 (two) times daily.   . Bromfenac Sodium (PROLENSA) 0.07 % SOLN Place 1 drop into the right eye 4 (four) times daily. (Patient not taking: Reported on 04/07/2020)  . Difluprednate (DUREZOL) 0.05 % EMUL Place 1 drop into the right eye every hour while awake.  . ketorolac (ACULAR) 0.5 % ophthalmic  solution   . prednisoLONE acetate (PRED FORTE) 1 % ophthalmic suspension Place 1 drop into the right eye every hour while awake.   Marland Kitchen RHOPRESSA 0.02 % SOLN Place 1 drop into both eyes every evening.  (Patient not taking: Reported on 04/07/2020)   No current facility-administered medications for this visit. (Ophthalmic Drugs)   Current Outpatient Medications (Other)  Medication Sig  . acetaZOLAMIDE (DIAMOX) 500 MG capsule Take by mouth. (Patient not taking: Reported on 04/07/2020)  . amLODipine-benazepril (LOTREL) 10-20 MG capsule One capsule daily  . amoxicillin (AMOXIL) 500 MG capsule TAKE 4 CAPSULE 1HR PRIOR TO DENTAL APPT  . amoxicillin-clavulanate (AUGMENTIN) 875-125 MG tablet TAKE ONE TABLET BY MOUTH 2 (TWO) TIMES DAILY FOR 7 DAYS.  Marland Kitchen aspirin 81 MG chewable tablet Chew 81 mg by mouth daily.  . bacitracin ointment Apply topically 2 (two) times daily.  . BD PEN NEEDLE NANO U/F 32G X 4 MM MISC USE AS DIRECTED 4 TIMES A DAY  . Blood Glucose Monitoring Suppl (ACCU-CHEK GUIDE) w/Device KIT   . docusate sodium (COLACE) 100 MG capsule Take by mouth.  . gabapentin (NEURONTIN) 300 MG capsule Take 2 capsules tid  . HUMALOG KWIKPEN 100 UNIT/ML KiwkPen Inject 0-60 Units into the skin 3 (three) times daily. Per sliding scale three times daily with meals  . insulin glargine (  LANTUS) 100 UNIT/ML injection Inject into the skin.  Marland Kitchen JARDIANCE 25 MG TABS tablet Take 25 mg by mouth daily.  . Lancets (ONETOUCH ULTRASOFT) lancets USE AS INSTRUCTED  . linaclotide (LINZESS) 290 MCG CAPS capsule TAKE 1 CAPSULE BY MOUTH EVERY DAY  . liraglutide (VICTOZA) 18 MG/3ML SOPN Inject 0.3 mLs (1.8 mg total) into the skin daily.  . magnesium citrate SOLN Take 129m once. Wait several hours and repeat if needed.  . metoCLOPramide (REGLAN) 10 MG tablet Take 1 tablet (10 mg total) by mouth every 6 (six) hours as needed for nausea.  . metoprolol succinate (TOPROL-XL) 100 MG 24 hr tablet TAKE 1 TABLET BY MOUTH EVERY DAY  .  mirabegron ER (MYRBETRIQ) 50 MG TB24 tablet TAKE 1 TABLET BY MOUTH EVERY DAY  . Multiple Vitamin (MULTIVITAMIN) tablet Take 1 tablet by mouth daily. Centrum silver  . NOVOLOG FLEXPEN 100 UNIT/ML FlexPen   . ONE TOUCH ULTRA TEST test strip   . pantoprazole (PROTONIX) 40 MG tablet TAKE 1 TABLET BY MOUTH EVERY DAY  . predniSONE (DELTASONE) 10 MG tablet Take 4 tablets (40 mg total) by mouth daily.  . Probiotic Product (PEast Globe CAPS Take 1 capsule by mouth daily.  . simvastatin (ZOCOR) 20 MG tablet TAKE 1 TABLET BY MOUTH EVERYDAY AT BEDTIME  . spironolactone (ALDACTONE) 25 MG tablet Take 25 mg by mouth daily.   .Marland Kitchentorsemide (DEMADEX) 20 MG tablet TAKE 1 TABLET BY MOUTH EVERY DAY  . UNABLE TO FIND TAKE 2 TEASPOONS(10MLS) BY MOUTH 4 TIMES A DAY FOR 5 DAYS  . vardenafil (LEVITRA) 20 MG tablet Take 20 mg by mouth as needed for erectile dysfunction.    No current facility-administered medications for this visit. (Other)      REVIEW OF SYSTEMS: ROS    Positive for: Gastrointestinal, Skin, Genitourinary, Endocrine, Eyes, Respiratory   Negative for: Constitutional, Neurological, Musculoskeletal, HENT, Cardiovascular, Psychiatric, Allergic/Imm, Heme/Lymph   Last edited by EDoneen Poissonon 04/09/2020  8:59 AM. (History)       ALLERGIES Allergies  Allergen Reactions  . Nsaids Other (See Comments)    GI BLEED  . Aspirin Other (See Comments)    STOMACH ULCER  STOMACH ULCER  STOMACH ULCER  STOMACH ULCER  STOMACH ULCER  STOMACH ULCER  STOMACH ULCER   . Other Other (See Comments)    blisters    PAST MEDICAL HISTORY Past Medical History:  Diagnosis Date  . Arthritis    "knees" (01/01/2016)  . Cataract    OS  . CHF (congestive heart failure) ([facility]   . Chronic kidney disease   . Diabetic retinopathy ([facility]    NPDR OU  . Erectile dysfunction   . Glaucoma    OS  . HTN (hypertension)   . Hyperlipidemia   . Neuromuscular disorder (HCC)    neuropathy  . Obesity   . OSA  on CPAP   . Pain of left upper arm   . Restrictive lung disease   . Sensory hearing loss, bilateral   . SOBOE (shortness of breath on exertion) 11/03/15   currently goes to gym  . Type II diabetes mellitus ([facility]    Past Surgical History:  Procedure Laterality Date  . CATARACT EXTRACTION Right 03/23/2020   Dr. RMarylynn Morris . COLONOSCOPY    . EYE SURGERY Right 03/23/2020   Cat Sx - Dr. RMarylynn Morris . GLAUCOMA SURGERY Bilateral    "laser"  . JOINT REPLACEMENT    . TOTAL KNEE ARTHROPLASTY  Left 11/09/2015   Procedure: LEFT TOTAL KNEE ARTHROPLASTY;  Surgeon: Gaynelle Arabian, MD;  Location: [facility];  Service: Orthopedics;  Laterality: Left;    FAMILY HISTORY Family History  Problem Relation Age of Onset  . Stroke Mother   . Diabetes Mother   . Hypertension Mother   . Hypertension Brother   . Diabetes Brother   . Diabetes Sister     SOCIAL HISTORY Social History   Tobacco Use  . Smoking status: Former Smoker    Packs/day: 1.00    Years: 7.00    Pack years: 7.00    Types: Cigarettes    Quit date: 08/01/1974    Years since quitting: 45.7  . Smokeless tobacco: Never Used  Substance Use Topics  . Alcohol use: No  . Drug use: No         OPHTHALMIC EXAM:  Base Eye Exam    Visual Acuity (Snellen - Linear)      Right Left   Dist cc CF @ face 20/40 -2   Dist ph cc NI NI   Correction: Glasses       Tonometry (Tonopen, 9:04 AM)      Right Left   Pressure 13 13       Pupils      Dark Light Shape React APD   Right 3 2 Round Minimal 0   Left 3 2 Round Minimal 0  ? Pupil size OD.  Hazy view       Visual Fields      Left Right    Full    Restrictions  Total superior temporal, inferior temporal, superior nasal, inferior nasal deficiencies       Extraocular Movement      Right Left    Full Full       Neuro/Psych    Oriented x3: Yes   Mood/Affect: Normal       Dilation    Both eyes: 1.0% Mydriacyl, 2.5% Phenylephrine @ 9:04 AM        Slit Lamp and  Fundus Exam    Slit Lamp Exam      Right Left   Lids/Lashes Dermatochalasis - upper lid, Meibomian gland dysfunction Dermatochalasis - upper lid, Ptosis, mild Meibomian gland dysfunction   Conjunctiva/Sclera White and quiet, SN bleb with trace injection, sutures intact Mild Melanosis   Cornea 3-4+ Microcystic edema with bullae - improving, arcus, nylon suture at 0900, 2-3+Descemet's folds temporally, mild haze improving, 3+ Punctate epithelial erosions Mild arcus, trace Punctate epithelial erosions   Anterior Chamber Deep, 3-4+ cell/flare - superior to fibrin ball; fibrin ball improving and shrinking, mild hyphema inferiorly -- not fully layered; NO HYPOPYON Deep and quiet   [facility] PI at 0130, laser PI at 1030, poorly dilated, hazy view Round and moderately dilated   Lens Posterior chamber intraocular lens with pigment deposition 3+ Nuclear sclerosis, 2-3+ Cortical cataract   Vitreous hazy view from [facility] fibrin, good red reflex syneresis       Fundus Exam      Right Left   Disc Mild Pallor, Sharp rim, +cupping Pink and Sharp, +cupping   C/D Ratio 0.6 0.75   Macula hazy view, grossly attached Flat, Good foveal reflex, mild  RPE mottling, Focal laser scars temporal macula   Vessels hazy view Vascular attenuation, mild tortuousity   Periphery hazy view, grossly attached, PRP visible Attached           Refraction    Wearing Rx  Sphere Cylinder Axis Add   Right +3.25 +1.50 078 +2.50   Left +4.75 +1.75 148 +2.50   Type: PAL          IMAGING AND PROCEDURES  Imaging and Procedures for 04/09/2020  OCT, Retina - OU - Both Eyes       Right Eye Quality was poor. Progression has no prior data. Findings include (Very hazy image, only scleral contour visible).   Left Eye Quality was good. Central Foveal Thickness: 256. Progression has been stable. Findings include vitreomacular adhesion , normal foveal contour, no SRF, outer retinal atrophy, no IRF (NFP, no IRF/SRF; ORA temporal  macula).   Notes *Images captured and stored on drive  Diagnosis / Impression:  OD: Very hazy image, only scleral contour visible OS: NFP, no IRF/SRF; ORA temporal macula  Clinical management:  See below  Abbreviations: NFP - Normal foveal profile. CME - cystoid macular edema. PED - pigment epithelial detachment. IRF - intraretinal fluid. SRF - subretinal fluid. EZ - ellipsoid zone. ERM - epiretinal membrane. ORA - outer retinal atrophy. ORT - outer retinal tubulation. SRHM - subretinal hyper-reflective material. 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 Uveitic glaucoma of right eye, unspecified glaucoma stage  H40.41X0    H20.9   3. Moderate nonproliferative diabetic retinopathy of both eyes without macular edema associated with type 2 diabetes mellitus ([place])  J33.5456   4. Retinal edema  H35.81 OCT, Retina - OU - Both Eyes  5. Essential hypertension  I10   6. Hypertensive retinopathy of both eyes  H35.033   7. Combined forms of age-related cataract of left eye  H25.812   8. Pseudophakia  Z96.1     1,2. 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  - pt with history of iritis and uveitic glaucoma OD  - s/p phaco/IOL + trab OD on 08.23.21 ([place])  - acute flare of iritis following 5-FU injection 09.01.21  - today, pt reports subjective improvement in eye pain and photophobia, vision remains blurry  - exam shows improved conj injection; microcystic corneal edema w/ bullae and haze - improving; fibrin ball shrinking and AC cell/flare improving; persistent hyphema inferiorly -- NO HYPOPYON TODAY; pigment on IOL  - view to posterior pole improving  - BCVA CF from [facility]  - IOP 13  - continue Prolensa QID OD  - continue PF Q1H OD  - continue po Prednisone 1m/daily  - atropine TID OD, besivance/ofloxacin QID OD per Dr. WVenetia Morris - f/u 1 week, sooner prn  3,4. Moderate  nonproliferative diabetic retinopathy w/o DME, OU  - history of focal laser with Dr. RZadie Rhinein the past - The incidence, risk factors for progression, natural history and treatment options for diabetic retinopathy were discussed with patient.   - The need for close monitoring of blood glucose, blood pressure, and serum lipids, avoiding cigarette or any type of tobacco, and the need for long term follow up was also discussed with patient. - exam  - OCT without diabetic macular edema, OU - f/u in 2-3 mos -- DFE/OCT  5,6. Hypertensive retinopathy OU - discussed importance of tight BP control - monitor  7. Mixed Cataract OS - The symptoms of cataract, surgical options, and treatments and risks were discussed with patient. - discussed diagnosis and progression - under the expert care of Dr. WVenetia Morris  8. Pseudophakia OD  - s/p CE/IOL + trab OD (Dr. Venetia Morris, 08.23.21)  - IOL in good position  - s/p 5-FU 09.01.21  - inflammation as above   Ophthalmic Meds Ordered this visit:  No orders of the defined types were placed in this encounter.      Return in about 1 week (around 04/16/2020) for f/u anterior uveitis OD, DFE, OCT.  There are no Patient Instructions on file for this visit.   Explained the diagnoses, plan, and follow up with the patient and they expressed understanding.  Patient expressed understanding of the importance of proper follow up care.  This document serves as a record of services personally performed by Gardiner Sleeper, MD, PhD. It was created on their behalf by Leonie Douglas, an ophthalmic technician. The creation of this record is the provider's dictation and/or activities during the visit.    Electronically signed by: Leonie Douglas COA, 04/09/20  11:10 AM  Gardiner Sleeper, M.D., Ph.D. Diseases & Surgery of the Retina and Vitreous [facility]  I have reviewed the above documentation for accuracy and completeness, and I agree with the above.  Gardiner Sleeper, M.D., Ph.D. 04/09/20 11:10 AM    Abbreviations: M myopia (nearsighted); A astigmatism; H hyperopia (farsighted); P presbyopia; Mrx spectacle prescription;  CTL contact lenses; OD right eye; OS left eye; OU both eyes  XT exotropia; ET esotropia; PEK punctate epithelial keratitis; PEE punctate epithelial erosions; DES dry eye syndrome; MGD meibomian gland dysfunction; ATs artificial tears; PFAT's preservative free artificial tears; [place] nuclear sclerotic cataract; PSC posterior subcapsular cataract; ERM epi-retinal membrane; PVD posterior vitreous detachment; RD retinal detachment; DM diabetes mellitus; DR diabetic retinopathy; NPDR non-proliferative diabetic retinopathy; PDR proliferative diabetic retinopathy; CSME clinically significant macular edema; DME diabetic macular edema; dbh dot blot hemorrhages; CWS cotton wool spot; POAG primary open angle glaucoma; C/D cup-to-disc ratio; HVF humphrey visual field; GVF goldmann visual field; OCT optical coherence tomography; IOP intraocular pressure; BRVO Branch retinal vein occlusion; CRVO central retinal vein occlusion; CRAO central retinal artery occlusion; BRAO branch retinal artery occlusion; RT retinal tear; SB scleral buckle; PPV pars plana vitrectomy; VH Vitreous hemorrhage; PRP panretinal laser photocoagulation; IVK intravitreal kenalog; VMT vitreomacular traction; MH Macular hole;  NVD neovascularization of the disc; NVE neovascularization elsewhere; AREDS age related eye disease study; ARMD age related macular degeneration; POAG primary open angle glaucoma; EBMD epithelial/anterior basement membrane dystrophy; ACIOL anterior chamber intraocular lens; IOL intraocular lens; PCIOL posterior chamber intraocular lens; Phaco/IOL phacoemulsification with intraocular lens placement; [place] photorefractive keratectomy; LASIK laser assisted in situ keratomileusis; HTN hypertension; DM diabetes mellitus; COPD chronic obstructive pulmonary disease

## 2020-04-09 ENCOUNTER — Other Ambulatory Visit: Payer: Self-pay

## 2020-04-09 ENCOUNTER — Ambulatory Visit (INDEPENDENT_AMBULATORY_CARE_PROVIDER_SITE_OTHER): Payer: Medicare PPO | Admitting: Ophthalmology

## 2020-04-09 ENCOUNTER — Encounter (INDEPENDENT_AMBULATORY_CARE_PROVIDER_SITE_OTHER): Payer: Self-pay | Admitting: Ophthalmology

## 2020-04-09 DIAGNOSIS — I10 Essential (primary) hypertension: Secondary | ICD-10-CM

## 2020-04-09 DIAGNOSIS — Z961 Presence of intraocular lens: Secondary | ICD-10-CM

## 2020-04-09 DIAGNOSIS — H3581 Retinal edema: Secondary | ICD-10-CM | POA: Diagnosis not present

## 2020-04-09 DIAGNOSIS — E113393 Type 2 diabetes mellitus with moderate nonproliferative diabetic retinopathy without macular edema, bilateral: Secondary | ICD-10-CM | POA: Diagnosis not present

## 2020-04-09 DIAGNOSIS — H4041X0 Glaucoma secondary to eye inflammation, right eye, stage unspecified: Secondary | ICD-10-CM | POA: Diagnosis not present

## 2020-04-09 DIAGNOSIS — H209 Unspecified iridocyclitis: Secondary | ICD-10-CM | POA: Diagnosis not present

## 2020-04-09 DIAGNOSIS — H25812 Combined forms of age-related cataract, left eye: Secondary | ICD-10-CM

## 2020-04-09 DIAGNOSIS — H35033 Hypertensive retinopathy, bilateral: Secondary | ICD-10-CM

## 2020-04-13 ENCOUNTER — Encounter (INDEPENDENT_AMBULATORY_CARE_PROVIDER_SITE_OTHER): Payer: Medicare PPO | Admitting: Ophthalmology

## 2020-04-15 NOTE — Progress Notes (Signed)
[facility] Note  04/16/2020     CHIEF COMPLAINT Patient presents for Retina Follow Up   HISTORY OF PRESENT ILLNESS: Micheal Morris is a 74 y.o. male who presents to the clinic today for:   HPI    Retina Follow Up    Patient presents with  Other.  In right eye.  This started weeks ago.  Severity is moderate.  Duration of weeks.  Since onset it is gradually improving.  I, the attending physician,  performed the HPI with the patient and updated documentation appropriately.          Comments    Pt is currently using: Durezol Q1H OD Atropine TID OD Prolensa QID OD Ofloxacin QID OD Combigan BID OS (Did not use this morning) Lumigan QHS OS Pt states vision is getting better OD and pain and irritation is getting better also.  Pt states OD is far from being 100% better but does see improvement.        Last edited by Bernarda Caffey, MD on 04/16/2020  8:32 AM. (History)    pt states his right eye is "slowly" healing; he saw Dr. [name] yesterday who said the bleb is still in place, pt is still taking oral prednisone, he states his blood sugar was high the first couple of days on it, but it has settled back down (120 this AM), he has not used any drops or taken any oral medication this morning, his next appt with Dr. [name] is next Wednesday   Referring physician: Marylynn Pearson, MD [address] [place],  [place] [address]  HISTORICAL INFORMATION:   Selected notes from the MEDICAL RECORD NUMBER Referred by Dr. 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 atropine 1 % ophthalmic solution Place 1 drop into the right eye 3 (three) times daily.   . bimatoprost (LUMIGAN) 0.01 % SOLN Place 1 drop into the left eye at bedtime.   . brimonidine-timolol (COMBIGAN) 0.2-0.5 % ophthalmic solution Place 1 drop into the left eye 2 (two) times daily.    . Bromfenac Sodium (PROLENSA) 0.07 % SOLN Place 1 drop into the right eye 4 (four) times daily. (Patient not taking: Reported on 04/07/2020)  . Difluprednate (DUREZOL) 0.05 % EMUL Place 1 drop into the right eye every hour while awake.  . ketorolac (ACULAR) 0.5 % ophthalmic solution   . prednisoLONE acetate (PRED FORTE) 1 % ophthalmic suspension Place 1 drop into the right eye every hour while awake.   Marland Kitchen RHOPRESSA 0.02 % SOLN Place 1 drop into both eyes every evening.  (Patient not taking: Reported on 04/07/2020)   No current facility-administered medications for this visit. (Ophthalmic Drugs)   Current Outpatient Medications (Other)  Medication Sig  . acetaZOLAMIDE (DIAMOX) 500 MG capsule Take by mouth. (Patient not taking: Reported on 04/07/2020)  . amLODipine-benazepril (LOTREL) 10-20 MG capsule One capsule daily  . amoxicillin (AMOXIL) 500 MG capsule TAKE 4 CAPSULE 1HR PRIOR TO DENTAL APPT  . amoxicillin-clavulanate (AUGMENTIN) 875-125 MG tablet TAKE ONE TABLET BY MOUTH 2 (TWO) TIMES DAILY FOR 7 DAYS.  Marland Kitchen aspirin 81 MG chewable tablet Chew 81 mg by mouth daily.  . bacitracin ointment Apply topically 2 (two) times daily.  . BD PEN NEEDLE NANO U/F 32G X 4 MM MISC USE AS DIRECTED 4 TIMES A DAY  . Blood Glucose Monitoring Suppl (ACCU-CHEK GUIDE) w/Device  KIT   . docusate sodium (COLACE) 100 MG capsule Take by mouth.  . gabapentin (NEURONTIN) 300 MG capsule Take 2 capsules tid  . HUMALOG KWIKPEN 100 UNIT/ML KiwkPen Inject 0-60 Units into the skin 3 (three) times daily. Per sliding scale three times daily with meals  . insulin glargine (LANTUS) 100 UNIT/ML injection Inject into the skin.  Marland Kitchen JARDIANCE 25 MG TABS tablet Take 25 mg by mouth daily.  . Lancets (ONETOUCH ULTRASOFT) lancets USE AS INSTRUCTED  . linaclotide (LINZESS) 290 MCG CAPS capsule TAKE 1 CAPSULE BY MOUTH EVERY DAY  . liraglutide (VICTOZA) 18 MG/3ML SOPN Inject 0.3 mLs (1.8 mg total) into the skin daily.  . magnesium citrate SOLN  Take 175m once. Wait several hours and repeat if needed.  . metoCLOPramide (REGLAN) 10 MG tablet Take 1 tablet (10 mg total) by mouth every 6 (six) hours as needed for nausea.  . metoprolol succinate (TOPROL-XL) 100 MG 24 hr tablet TAKE 1 TABLET BY MOUTH EVERY DAY  . mirabegron ER (MYRBETRIQ) 50 MG TB24 tablet TAKE 1 TABLET BY MOUTH EVERY DAY  . Multiple Vitamin (MULTIVITAMIN) tablet Take 1 tablet by mouth daily. Centrum silver  . NOVOLOG FLEXPEN 100 UNIT/ML FlexPen   . ONE TOUCH ULTRA TEST test strip   . pantoprazole (PROTONIX) 40 MG tablet TAKE 1 TABLET BY MOUTH EVERY DAY  . predniSONE (DELTASONE) 10 MG tablet Take 4 tablets (40 mg total) by mouth daily.  . Probiotic Product (PR[place] CAPS Take 1 capsule by mouth daily.  . simvastatin (ZOCOR) 20 MG tablet TAKE 1 TABLET BY MOUTH EVERYDAY AT BEDTIME  . spironolactone (ALDACTONE) 25 MG tablet Take 25 mg by mouth daily.   .Marland Kitchentorsemide (DEMADEX) 20 MG tablet TAKE 1 TABLET BY MOUTH EVERY DAY  . UNABLE TO FIND TAKE 2 TEASPOONS(10MLS) BY MOUTH 4 TIMES A DAY FOR 5 DAYS  . vardenafil (LEVITRA) 20 MG tablet Take 20 mg by mouth as needed for erectile dysfunction.    No current facility-administered medications for this visit. (Other)      REVIEW OF SYSTEMS: ROS    Positive for: Gastrointestinal, Skin, Genitourinary, Endocrine, Eyes, Respiratory   Negative for: Constitutional, Neurological, Musculoskeletal, HENT, Cardiovascular, Psychiatric, Allergic/Imm, Heme/Lymph   Last edited by EDoneen Poissonon 04/16/2020  8:01 AM. (History)       ALLERGIES Allergies  Allergen Reactions  . Nsaids Other (See Comments)    GI BLEED  . Aspirin Other (See Comments)    STOMACH ULCER  STOMACH ULCER  STOMACH ULCER  STOMACH ULCER  STOMACH ULCER  STOMACH ULCER  STOMACH ULCER   . Other Other (See Comments)    blisters    PAST MEDICAL HISTORY Past Medical History:  Diagnosis Date  . Arthritis    "knees" (01/01/2016)  . Cataract    OS   . CHF (congestive heart failure) ([place]   . Chronic kidney disease   . Diabetic retinopathy ([place]    NPDR OU  . Erectile dysfunction   . Glaucoma    OS  . HTN (hypertension)   . Hyperlipidemia   . Neuromuscular disorder (HCC)    neuropathy  . Obesity   . OSA on CPAP   . Pain of left upper arm   . Restrictive lung disease   . Sensory hearing loss, bilateral   . SOBOE (shortness of breath on exertion) 11/03/15   currently goes to gym  . Type II diabetes mellitus ([place]    Past Surgical History:  Procedure Laterality Date  . CATARACT EXTRACTION Right 03/23/2020   Dr. Marylynn Pearson  . COLONOSCOPY    . EYE SURGERY Right 03/23/2020   Cat Sx - Dr. Marylynn Pearson  . GLAUCOMA SURGERY Bilateral    "laser"  . JOINT REPLACEMENT    . TOTAL KNEE ARTHROPLASTY Left 11/09/2015   Procedure: LEFT TOTAL KNEE ARTHROPLASTY;  Surgeon: Gaynelle Arabian, MD;  Location: [facility];  Service: Orthopedics;  Laterality: Left;    FAMILY HISTORY Family History  Problem Relation Age of Onset  . Stroke Mother   . Diabetes Mother   . Hypertension Mother   . Hypertension Brother   . Diabetes Brother   . Diabetes Sister     SOCIAL HISTORY Social History   Tobacco Use  . Smoking status: Former Smoker    Packs/day: 1.00    Years: 7.00    Pack years: 7.00    Types: Cigarettes    Quit date: 08/01/1974    Years since quitting: 45.7  . Smokeless tobacco: Never Used  Substance Use Topics  . Alcohol use: No  . Drug use: No         OPHTHALMIC EXAM:  Base Eye Exam    Visual Acuity (Snellen - Linear)      Right Left   Dist cc 20/200 -1 20/50 -1   Dist ph cc 20/80 +2 20/40 -2   Correction: Glasses       Tonometry (Tonopen, 8:05 AM)      Right Left   Pressure 24 32  1 gtt cosopt OS @ 8:12 1 gtt brimonidine OS @ 8:13       Pupils      Dark Light Shape React APD   Right 3 2 Round Minimal 0   Left 3 2 Round Minimal 0       Visual Fields      Left Right    Full    Restrictions  Partial outer  superior temporal, inferior temporal, superior nasal, inferior nasal deficiencies       Extraocular Movement      Right Left    Full Full       Neuro/Psych    Oriented x3: Yes   Mood/Affect: Normal       Dilation    Both eyes: 1.0% Mydriacyl, 2.5% Phenylephrine @ 8:05 AM        Slit Lamp and Fundus Exam    Slit Lamp Exam      Right Left   Lids/Lashes Dermatochalasis - upper lid, Meibomian gland dysfunction Dermatochalasis - upper lid, Ptosis, mild Meibomian gland dysfunction   Conjunctiva/Sclera White and quiet, SN bleb with trace injection, sutures intact Mild Melanosis   Cornea 3-4+ Microcystic edema with bullae - improved, arcus, nylon suture at 0900 w/ 1+Descemet's folds temporally, +corneal haze, 2-3+ Punctate epithelial erosions Mild arcus, trace Punctate epithelial erosions   Anterior Chamber Deep, fibrin ball shrinking, but persistent over pupil; 2+ cell/flare outside of fibrin ball; mild hyphema inferiorly resolved; NO HYPOPYON Deep and quiet   Iris Almost 360 PS; Surgical PI at 0130, laser PI at 1030, poorly dilated, hazy view, Iris atrophy temporally Round and moderately dilated   Lens Posterior chamber intraocular lens with pigment deposition 3+ Nuclear sclerosis, 2-3+ Cortical cataract   Vitreous hazy view from [facility] fibrin, good red reflex, Vitreous syneresis syneresis       Fundus Exam      Right Left   Disc Hazy view, Mild Pallor, Sharp rim, +cupping Pink and  Sharp, +cupping   C/D Ratio 0.6 0.75   Macula hazy view, grossly attached, Good foveal reflex Flat, Good foveal reflex, mild  RPE mottling, Focal laser scars temporal macula   Vessels Vascular attenuation Vascular attenuation   Periphery hazy view, grossly attached, PRP visible Attached           Refraction    Wearing Rx      Sphere Cylinder Axis Add   Right +3.25 +1.50 078 +2.50   Left +4.75 +1.75 148 +2.50   Type: PAL          IMAGING AND PROCEDURES  Imaging and Procedures for 04/16/2020  OCT,  Retina - OU - Both Eyes       Right Eye Quality was borderline. Central Foveal Thickness: 302. Progression has no prior data. Findings include normal foveal contour, no IRF, no SRF, vitreomacular adhesion  (Blunted foveal contour; mild ERM/VMA).   Left Eye Quality was good. Central Foveal Thickness: 258. Progression has been stable. Findings include vitreomacular adhesion , normal foveal contour, no SRF, outer retinal atrophy, no IRF (NFP, no IRF/SRF; ORA temporal macula).   Notes *Images captured and stored on drive  Diagnosis / Impression:  OD: Blunted foveal contour; mild ERM/VMA OS: NFP, no IRF/SRF; ORA temporal macula  Clinical management:  See below  Abbreviations: NFP - Normal foveal profile. CME - cystoid macular edema. PED - pigment epithelial detachment. IRF - intraretinal fluid. SRF - subretinal fluid. EZ - ellipsoid zone. ERM - epiretinal membrane. ORA - outer retinal atrophy. ORT - outer retinal tubulation. SRHM - subretinal hyper-reflective material. 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 Uveitic glaucoma of right eye, unspecified glaucoma stage  H40.41X0    H20.9   3. Moderate nonproliferative diabetic retinopathy of both eyes without macular edema associated with type 2 diabetes mellitus ([place])  H41.7408   4. Retinal edema  H35.81 OCT, Retina - OU - Both Eyes  5. Essential hypertension  I10   6. Hypertensive retinopathy of both eyes  H35.033   7. Combined forms of age-related cataract of left eye  H25.812   8. Pseudophakia  Z96.1     1,2. 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  - pt with history of iritis and uveitic glaucoma OD  - s/p phaco/IOL + trab OD on 08.23.21 ([place])  - acute flare of iritis following 5-FU injection 09.01.21  - today, pt reports subjective improvement in eye pain and photophobia, vision remains blurry  - exam shows improved  conj injection; microcystic corneal edema w/ bullae and haze - improving; fibrin ball shrinking and AC cell/flare improving; almost 360 PS; hyphema improved -- NO HYPOPYON TODAY; pigment on IOL  - view to posterior pole improving  - BCVA 20/80 from CF  - IOP 24 -- pt has not used any drops this morning -- recommend starting combigan BID OD  - continue Prolensa QID OD  - continue PF Q1H OD  - continue po Prednisone 9m/daily  - atropine TID OD, besivance/ofloxacin QID OD per Dr. WVenetia Maxon - f/u Tuesday, September 21, sooner prn  3,4. 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 RZadie Rhinein the past - The incidence, risk factors for progression, natural history and treatment options for diabetic retinopathy were discussed with patient.   -  The need for close monitoring of blood glucose, blood pressure, and serum lipids, avoiding cigarette or any type of tobacco, and the need for long term follow up was also discussed with patient. - exam  - OCT without diabetic macular edema, OU  5,6. Hypertensive retinopathy OU - discussed importance of tight BP control - monitor  7. Mixed Cataract OS - The symptoms of cataract, surgical options, and treatments and risks were discussed with patient. - discussed diagnosis and progression - under the expert care of Dr. [name]  8. Pseudophakia OD  - s/p CE/IOL + trab OD (Dr. [name], 08.23.21)  - IOL in good position  - s/p 5-FU 09.01.21  - inflammation as above   Ophthalmic Meds Ordered this visit:  No orders of the defined types were placed in this encounter.      Return in about 5 days (around 04/21/2020) for f/u anterior uveitis OD, DFE, OCT.  There are no Patient Instructions on file for this visit.   Explained the diagnoses, plan, and follow up with the patient and they expressed understanding.  Patient expressed understanding of the importance of proper follow up care.  This document  serves as a record of services personally performed by Gardiner Sleeper, MD, PhD. It was created on their behalf by Leonie Douglas, an ophthalmic technician. The creation of this record is the provider's dictation and/or activities during the visit.    Electronically signed by: Leonie Douglas COA, 04/16/20  9:08 AM   This document serves as a record of services personally performed by Gardiner Sleeper, MD, PhD. It was created on their behalf by San Jetty. Owens Shark, OA an ophthalmic technician. The creation of this record is the provider's dictation and/or activities during the visit.    Electronically signed by: San Jetty. Owens Shark, [place] 09.16.2021 9:08 AM  Gardiner Sleeper, M.D., Ph.D. Diseases & Surgery of the Retina and Vitreous [facility]  I have reviewed the above documentation for accuracy and completeness, and I agree with the above. Gardiner Sleeper, M.D., Ph.D. 04/16/20 9:08 AM   Abbreviations: M myopia (nearsighted); A astigmatism; H hyperopia (farsighted); P presbyopia; Mrx spectacle prescription;  CTL contact lenses; OD right eye; OS left eye; OU both eyes  XT exotropia; ET esotropia; PEK punctate epithelial keratitis; PEE punctate epithelial erosions; DES dry eye syndrome; MGD meibomian gland dysfunction; ATs artificial tears; PFAT's preservative free artificial tears; [place] nuclear sclerotic cataract; PSC posterior subcapsular cataract; ERM epi-retinal membrane; PVD posterior vitreous detachment; RD retinal detachment; DM diabetes mellitus; DR diabetic retinopathy; NPDR non-proliferative diabetic retinopathy; PDR proliferative diabetic retinopathy; CSME clinically significant macular edema; DME diabetic macular edema; dbh dot blot hemorrhages; CWS cotton wool spot; POAG primary open angle glaucoma; C/D cup-to-disc ratio; HVF humphrey visual field; GVF goldmann visual field; OCT optical coherence tomography; IOP intraocular pressure; BRVO Branch retinal vein occlusion; CRVO central  retinal vein occlusion; CRAO central retinal artery occlusion; BRAO branch retinal artery occlusion; RT retinal tear; SB scleral buckle; PPV pars plana vitrectomy; VH Vitreous hemorrhage; PRP panretinal laser photocoagulation; IVK intravitreal kenalog; VMT vitreomacular traction; MH Macular hole;  NVD neovascularization of the disc; NVE neovascularization elsewhere; AREDS age related eye disease study; ARMD age related macular degeneration; POAG primary open angle glaucoma; EBMD epithelial/anterior basement membrane dystrophy; ACIOL anterior chamber intraocular lens; IOL intraocular lens; PCIOL posterior chamber intraocular lens; Phaco/IOL phacoemulsification with intraocular lens placement; [place] photorefractive keratectomy; LASIK laser assisted in situ keratomileusis; HTN hypertension; DM diabetes mellitus; COPD chronic  obstructive pulmonary disease

## 2020-04-16 ENCOUNTER — Encounter (INDEPENDENT_AMBULATORY_CARE_PROVIDER_SITE_OTHER): Payer: Self-pay | Admitting: Ophthalmology

## 2020-04-16 ENCOUNTER — Ambulatory Visit (INDEPENDENT_AMBULATORY_CARE_PROVIDER_SITE_OTHER): Payer: Medicare PPO | Admitting: Ophthalmology

## 2020-04-16 ENCOUNTER — Other Ambulatory Visit: Payer: Self-pay

## 2020-04-16 DIAGNOSIS — H3581 Retinal edema: Secondary | ICD-10-CM | POA: Diagnosis not present

## 2020-04-16 DIAGNOSIS — H35033 Hypertensive retinopathy, bilateral: Secondary | ICD-10-CM

## 2020-04-16 DIAGNOSIS — E113393 Type 2 diabetes mellitus with moderate nonproliferative diabetic retinopathy without macular edema, bilateral: Secondary | ICD-10-CM | POA: Diagnosis not present

## 2020-04-16 DIAGNOSIS — H209 Unspecified iridocyclitis: Secondary | ICD-10-CM

## 2020-04-16 DIAGNOSIS — Z961 Presence of intraocular lens: Secondary | ICD-10-CM

## 2020-04-16 DIAGNOSIS — I10 Essential (primary) hypertension: Secondary | ICD-10-CM

## 2020-04-16 DIAGNOSIS — H25812 Combined forms of age-related cataract, left eye: Secondary | ICD-10-CM

## 2020-04-16 DIAGNOSIS — H4041X0 Glaucoma secondary to eye inflammation, right eye, stage unspecified: Secondary | ICD-10-CM

## 2020-04-20 NOTE — Progress Notes (Signed)
[facility] Note  04/21/2020     CHIEF COMPLAINT Patient presents for Retina Follow Up   HISTORY OF PRESENT ILLNESS: Micheal Morris is a 74 y.o. male who presents to the clinic today for:   HPI    Retina Follow Up    Patient presents with  Other.  In right eye.  This started 5 days ago.  Severity is moderate.  I, the attending physician,  performed the HPI with the patient and updated documentation appropriately.          Comments    Patient here for 5 days retina follow up for  Anterior Uveitis OD. Patient states vision doing really slowly better. Last night had bad eye pain OD. Used thera tears helped soothe. Using drops combigan BID OD, Atropine TID OD, Ofloxacin QID OD, Durezol every hr. Prolensa QID OD.        Last edited by Bernarda Caffey, MD on 04/21/2020  1:25 PM. (History)    pt states vision is continuing to improve, he states he is seeing spikes in his blood sugar in the evenings (last night was 300) due to the prednisone, pts wife states last night his eye was very red and painful, he used Thera Tears to calm it down and went to bed early   Referring physician: Marylynn Pearson, MD [address],  [place] [address]  HISTORICAL INFORMATION:   Selected notes from the MEDICAL RECORD NUMBER Referred by Dr. 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 atropine 1 % ophthalmic solution Place 1 drop into the right eye 3 (three) times daily.   . bimatoprost (LUMIGAN) 0.01 % SOLN Place 1 drop into the left eye at bedtime.   . brimonidine-timolol (COMBIGAN) 0.2-0.5 % ophthalmic solution Place 1 drop into the left eye 2 (two) times daily.   . Bromfenac Sodium (PROLENSA) 0.07 % SOLN Place 1 drop into the right eye 4 (four) times daily. (Patient not taking: Reported on 04/07/2020)  . Difluprednate (DUREZOL) 0.05 % EMUL Place 1 drop  into the right eye every hour while awake.  . ketorolac (ACULAR) 0.5 % ophthalmic solution   . prednisoLONE acetate (PRED FORTE) 1 % ophthalmic suspension Place 1 drop into the right eye every hour while awake.   Marland Kitchen RHOPRESSA 0.02 % SOLN Place 1 drop into both eyes every evening.  (Patient not taking: Reported on 04/07/2020)   No current facility-administered medications for this visit. (Ophthalmic Drugs)   Current Outpatient Medications (Other)  Medication Sig  . acetaZOLAMIDE (DIAMOX) 500 MG capsule Take by mouth. (Patient not taking: Reported on 04/07/2020)  . amLODipine-benazepril (LOTREL) 10-20 MG capsule One capsule daily  . amoxicillin (AMOXIL) 500 MG capsule TAKE 4 CAPSULE 1HR PRIOR TO DENTAL APPT  . amoxicillin-clavulanate (AUGMENTIN) 875-125 MG tablet TAKE ONE TABLET BY MOUTH 2 (TWO) TIMES DAILY FOR 7 DAYS.  Marland Kitchen aspirin 81 MG chewable tablet Chew 81 mg by mouth daily.  . bacitracin ointment Apply topically 2 (two) times daily.  . BD PEN NEEDLE NANO U/F 32G X 4 MM MISC USE AS DIRECTED 4 TIMES A DAY  . Blood Glucose Monitoring Suppl (ACCU-CHEK GUIDE) w/Device KIT   . docusate sodium (COLACE) 100 MG capsule Take by mouth.  . gabapentin (NEURONTIN) 300 MG capsule Take 2 capsules tid  . HUMALOG KWIKPEN 100 UNIT/ML KiwkPen Inject 0-60 Units into the  skin 3 (three) times daily. Per sliding scale three times daily with meals  . insulin glargine (LANTUS) 100 UNIT/ML injection Inject into the skin.  Marland Kitchen JARDIANCE 25 MG TABS tablet Take 25 mg by mouth daily.  . Lancets (ONETOUCH ULTRASOFT) lancets USE AS INSTRUCTED  . linaclotide (LINZESS) 290 MCG CAPS capsule TAKE 1 CAPSULE BY MOUTH EVERY DAY  . liraglutide (VICTOZA) 18 MG/3ML SOPN Inject 0.3 mLs (1.8 mg total) into the skin daily.  . magnesium citrate SOLN Take once. Wait several hours and repeat if needed.  . metoCLOPramide (REGLAN) 10 MG tablet Take 1 tablet (10 mg total) by mouth every 6 (six) hours as needed for nausea.  . metoprolol  succinate (TOPROL-XL) 100 MG 24 hr tablet TAKE 1 TABLET BY MOUTH EVERY DAY  . mirabegron ER (MYRBETRIQ) 50 MG TB24 tablet TAKE 1 TABLET BY MOUTH EVERY DAY  . Multiple Vitamin (MULTIVITAMIN) tablet Take 1 tablet by mouth daily. Centrum silver  . NOVOLOG FLEXPEN 100 UNIT/ML FlexPen   . ONE TOUCH ULTRA TEST test strip   . pantoprazole (PROTONIX) 40 MG tablet TAKE 1 TABLET BY MOUTH EVERY DAY  . predniSONE (DELTASONE) 10 MG tablet Take 4 tablets (40 mg total) by mouth daily.  . Probiotic Product (PHILLIPS COLON HEALTH) CAPS Take 1 capsule by mouth daily.  . simvastatin (ZOCOR) 20 MG tablet TAKE 1 TABLET BY MOUTH EVERYDAY AT BEDTIME  . spironolactone (ALDACTONE) 25 MG tablet Take 25 mg by mouth daily.   Marland Kitchen torsemide (DEMADEX) 20 MG tablet TAKE 1 TABLET BY MOUTH EVERY DAY  . UNABLE TO FIND TAKE 2 TEASPOONS(10MLS) BY MOUTH 4 TIMES A DAY FOR 5 DAYS  . vardenafil (LEVITRA) 20 MG tablet Take 20 mg by mouth as needed for erectile dysfunction.    No current facility-administered medications for this visit. (Other)      REVIEW OF SYSTEMS: ROS    Positive for: Gastrointestinal, Skin, Genitourinary, Endocrine, Eyes, Respiratory   Negative for: Constitutional, Neurological, Musculoskeletal, HENT, Cardiovascular, Psychiatric, Allergic/Imm, Heme/Lymph   Last edited by Laddie Aquas, COA on 04/21/2020 10:04 AM. (History)       ALLERGIES Allergies  Allergen Reactions  . Nsaids Other (See Comments)    GI BLEED  . Aspirin Other (See Comments)    STOMACH ULCER  STOMACH ULCER  STOMACH ULCER  STOMACH ULCER  STOMACH ULCER  STOMACH ULCER  STOMACH ULCER   . Other Other (See Comments)    blisters    PAST MEDICAL HISTORY Past Medical History:  Diagnosis Date  . Arthritis    "knees" (01/01/2016)  . Cataract    OS  . CHF (congestive heart failure) (HCC)   . Chronic kidney disease   . Diabetic retinopathy (HCC)    NPDR OU  . Erectile dysfunction   . Glaucoma    OS  . HTN (hypertension)   .  Hyperlipidemia   . Neuromuscular disorder (HCC)    neuropathy  . Obesity   . OSA on CPAP   . Pain of left upper arm   . Restrictive lung disease   . Sensory hearing loss, bilateral   . SOBOE (shortness of breath on exertion) 11/03/15   currently goes to gym  . Type II diabetes mellitus (HCC)    Past Surgical History:  Procedure Laterality Date  . CATARACT EXTRACTION Right 03/23/2020   Dr. Chalmers Guest  . COLONOSCOPY    . EYE SURGERY Right 03/23/2020   Cat Sx - Dr. Chalmers Guest  . GLAUCOMA  SURGERY Bilateral    "laser"  . JOINT REPLACEMENT    . TOTAL KNEE ARTHROPLASTY Left 11/09/2015   Procedure: LEFT TOTAL KNEE ARTHROPLASTY;  Surgeon: Gaynelle Arabian, MD;  Location: [facility];  Service: Orthopedics;  Laterality: Left;    FAMILY HISTORY Family History  Problem Relation Age of Onset  . Stroke Mother   . Diabetes Mother   . Hypertension Mother   . Hypertension Brother   . Diabetes Brother   . Diabetes Sister     SOCIAL HISTORY Social History   Tobacco Use  . Smoking status: Former Smoker    Packs/day: 1.00    Years: 7.00    Pack years: 7.00    Types: Cigarettes    Quit date: 08/01/1974    Years since quitting: 45.7  . Smokeless tobacco: Never Used  Substance Use Topics  . Alcohol use: No  . Drug use: No         OPHTHALMIC EXAM:  Base Eye Exam    Visual Acuity (Snellen - Linear)      Right Left   Dist cc 20/80 20/50 -2   Dist ph cc 20/50 -2 20/40 -1       Tonometry (Tonopen, 9:59 AM)      Right Left   Pressure 20 13       Pupils      Dark Light Shape React APD   Right 3 2 Round Minimal None   Left 3 2 Round Minimal None       Visual Fields      Left Right    Full    Restrictions  Partial outer superior temporal, inferior temporal, superior nasal, inferior nasal deficiencies       Extraocular Movement      Right Left    Full Full       Neuro/Psych    Oriented x3: Yes   Mood/Affect: Normal       Dilation    Both eyes: 1.0% Mydriacyl, 2.5%  Phenylephrine @ 9:59 AM        Slit Lamp and Fundus Exam    Slit Lamp Exam      Right Left   Lids/Lashes Dermatochalasis - upper lid, Meibomian gland dysfunction Dermatochalasis - upper lid, Ptosis, mild Meibomian gland dysfunction   Conjunctiva/Sclera SN bleb elevated with 2+ focal injection, sutures intact, trace-1+ Injection nasal hemisphere Mild Melanosis   Cornea 2+ Punctate epithelial erosions, mild centra haze, arcus, nylon suture at 0900 with 1+ Descemet's folds surrounding Mild arcus, trace Punctate epithelial erosions   Anterior Chamber Deep, 4+ pigment, fibrin ball condensing further (approx: 3.84m), overlying pupil and IOL; 2+ cell/flare outside of fibrin ball; mild hyphema inferiorly resolved; NO HYPOPYON Deep and quiet   Iris Almost 360 PS; Surgical PI at 0130, laser PI at 1030, poorly dilated, Iris atrophy temporally Round and moderately dilated   Lens Posterior chamber intraocular lens with pigment deposition and fibrin ball overlying 3+ Nuclear sclerosis, 2-3+ Cortical cataract   Vitreous hazy view from AT[facility] LLCfibrin, good red reflex, Vitreous syneresis syneresis       Fundus Exam      Right Left   Disc Hazy view, Mild Pallor, Sharp rim, +cupping Pink and Sharp, +cupping   C/D Ratio 0.6 0.75   Macula hazy view, grossly attached, Good foveal reflex Flat, Good foveal reflex, mild  RPE mottling, Focal laser scars temporal macula   Vessels Vascular attenuation Vascular attenuation   Periphery hazy view, grossly attached, PRP visible Attached  Refraction    Wearing Rx      Sphere Cylinder Axis Add   Right +3.25 +1.50 078 +2.50   Left +4.75 +1.75 148 +2.50   Type: PAL          IMAGING AND PROCEDURES  Imaging and Procedures for 04/21/2020  OCT, Retina - OU - Both Eyes       Right Eye Quality was borderline. Central Foveal Thickness: 308. Progression has been stable. Findings include normal foveal contour, no IRF, no SRF, vitreomacular adhesion  (Blunted  foveal contour; mild ERM/VMA).   Left Eye Quality was good. Central Foveal Thickness: 258. Progression has been stable. Findings include vitreomacular adhesion , normal foveal contour, no SRF, outer retinal atrophy, no IRF (NFP, no IRF/SRF; ORA temporal macula).   Notes *Images captured and stored on drive  Diagnosis / Impression:  OD: Blunted foveal contour; mild ERM/VMA OS: NFP, no IRF/SRF; ORA temporal macula  Clinical management:  See below  Abbreviations: NFP - Normal foveal profile. CME - cystoid macular edema. PED - pigment epithelial detachment. IRF - intraretinal fluid. SRF - subretinal fluid. EZ - ellipsoid zone. ERM - epiretinal membrane. ORA - outer retinal atrophy. ORT - outer retinal tubulation. SRHM - subretinal hyper-reflective material. 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 Uveitic glaucoma of right eye, unspecified glaucoma stage  H40.41X0    H20.9   3. Moderate nonproliferative diabetic retinopathy of both eyes without macular edema associated with type 2 diabetes mellitus ([place])  U44.0347   4. Retinal edema  H35.81 OCT, Retina - OU - Both Eyes  5. Essential hypertension  I10   6. Hypertensive retinopathy of both eyes  H35.033   7. Combined forms of age-related cataract of left eye  H25.812   8. Pseudophakia  Z96.1     1,2. 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  - pt with history of iritis and uveitic glaucoma OD  - s/p phaco/IOL + trab OD on 08.23.21 ([facility])  - acute flare of iritis following 5-FU injection 09.01.21  - today, pt reports subjective improvement in vision; had flare up of eye pain and redness yesterday -- improved with ATs  - exam shows improved conj injection; corneal edema improved; fibrin ball shrinking and AC cell/flare improving; almost 360 PS; hyphema improved -- NO HYPOPYON TODAY; pigment on IOL  - view to posterior pole  improving  - BCVA 20/50 from 20/80  - IOP 20  - continue Prolensa QID OD  - continue Durezol Q1H OD  - continue po Prednisone 24m/daily -- decrease to 322mfor 5 days, 2027mor 5 days, 68m69mr 5 days  - atropine TID OD, besivance/ofloxacin QID OD per Dr. WhitVenetia Maxonf/u 1 week, DFE, OCT, sooner prn  - discussed case with Dr. WhitVenetia Maxon will take over management as the retina is stable and there is no endophthalmitis  3,4. 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 RankZadie Rhinethe past - The incidence, risk factors for progression, natural history and treatment options for diabetic retinopathy were discussed with patient.   - The need for close monitoring of blood glucose, blood pressure, and serum lipids, avoiding cigarette or any type of tobacco, and the need for long term follow up was also discussed with patient. - exam  -  OCT without diabetic macular edema, OU  5,6. Hypertensive retinopathy OU - discussed importance of tight BP control - monitor  7. Mixed Cataract OS - The symptoms of cataract, surgical options, and treatments and risks were discussed with patient. - discussed diagnosis and progression - under the expert care of Dr. [name]  8. Pseudophakia OD  - s/p CE/IOL + trab OD (Dr. [name], 08.23.21)  - IOL in good position  - s/p 5-FU 09.01.21  - inflammation as above   Ophthalmic Meds Ordered this visit:  No orders of the defined types were placed in this encounter.      Return in about 1 week (around 04/28/2020) for f/u anterior uveitis OD, DFE, OCT.  There are no Patient Instructions on file for this visit.   Explained the diagnoses, plan, and follow up with the patient and they expressed understanding.  Patient expressed understanding of the importance of proper follow up care.  This document serves as a record of services personally performed by Gardiner Sleeper, MD, PhD. It was created on their behalf  by Estill Bakes, COT an ophthalmic technician. The creation of this record is the provider's dictation and/or activities during the visit.    Electronically signed by: Estill Bakes, COT 9.20.21 @ 1:28 PM  Gardiner Sleeper, M.D., Ph.D. Diseases & Surgery of the Retina and [place] 04/21/2020   I have reviewed the above documentation for accuracy and completeness, and I agree with the above. Gardiner Sleeper, M.D., Ph.D. 04/21/20 1:28 PM   Abbreviations: M myopia (nearsighted); A astigmatism; H hyperopia (farsighted); P presbyopia; Mrx spectacle prescription;  CTL contact lenses; OD right eye; OS left eye; OU both eyes  XT exotropia; ET esotropia; PEK punctate epithelial keratitis; PEE punctate epithelial erosions; DES dry eye syndrome; MGD meibomian gland dysfunction; ATs artificial tears; PFAT's preservative free artificial tears; Brewer nuclear sclerotic cataract; PSC posterior subcapsular cataract; ERM epi-retinal membrane; PVD posterior vitreous detachment; RD retinal detachment; DM diabetes mellitus; DR diabetic retinopathy; NPDR non-proliferative diabetic retinopathy; PDR proliferative diabetic retinopathy; CSME clinically significant macular edema; DME diabetic macular edema; dbh dot blot hemorrhages; CWS cotton wool spot; POAG primary open angle glaucoma; C/D cup-to-disc ratio; HVF humphrey visual field; GVF goldmann visual field; OCT optical coherence tomography; IOP intraocular pressure; BRVO Branch retinal vein occlusion; CRVO central retinal vein occlusion; CRAO central retinal artery occlusion; BRAO branch retinal artery occlusion; RT retinal tear; SB scleral buckle; PPV pars plana vitrectomy; VH Vitreous hemorrhage; PRP panretinal laser photocoagulation; IVK intravitreal kenalog; VMT vitreomacular traction; MH Macular hole;  NVD neovascularization of the disc; NVE neovascularization elsewhere; AREDS age related eye disease study; ARMD age related macular  degeneration; POAG primary open angle glaucoma; EBMD epithelial/anterior basement membrane dystrophy; ACIOL anterior chamber intraocular lens; IOL intraocular lens; PCIOL posterior chamber intraocular lens; Phaco/IOL phacoemulsification with intraocular lens placement; Melbeta photorefractive keratectomy; LASIK laser assisted in situ keratomileusis; HTN hypertension; DM diabetes mellitus; COPD chronic obstructive pulmonary disease

## 2020-04-21 ENCOUNTER — Ambulatory Visit (INDEPENDENT_AMBULATORY_CARE_PROVIDER_SITE_OTHER): Payer: Medicare PPO | Admitting: Ophthalmology

## 2020-04-21 ENCOUNTER — Encounter (INDEPENDENT_AMBULATORY_CARE_PROVIDER_SITE_OTHER): Payer: Self-pay | Admitting: Ophthalmology

## 2020-04-21 ENCOUNTER — Other Ambulatory Visit: Payer: Self-pay

## 2020-04-21 DIAGNOSIS — H35033 Hypertensive retinopathy, bilateral: Secondary | ICD-10-CM

## 2020-04-21 DIAGNOSIS — H4041X0 Glaucoma secondary to eye inflammation, right eye, stage unspecified: Secondary | ICD-10-CM | POA: Diagnosis not present

## 2020-04-21 DIAGNOSIS — H3581 Retinal edema: Secondary | ICD-10-CM | POA: Diagnosis not present

## 2020-04-21 DIAGNOSIS — H25812 Combined forms of age-related cataract, left eye: Secondary | ICD-10-CM

## 2020-04-21 DIAGNOSIS — E113393 Type 2 diabetes mellitus with moderate nonproliferative diabetic retinopathy without macular edema, bilateral: Secondary | ICD-10-CM | POA: Diagnosis not present

## 2020-04-21 DIAGNOSIS — H209 Unspecified iridocyclitis: Secondary | ICD-10-CM | POA: Diagnosis not present

## 2020-04-21 DIAGNOSIS — Z961 Presence of intraocular lens: Secondary | ICD-10-CM

## 2020-04-21 DIAGNOSIS — I10 Essential (primary) hypertension: Secondary | ICD-10-CM

## 2020-04-24 ENCOUNTER — Other Ambulatory Visit (INDEPENDENT_AMBULATORY_CARE_PROVIDER_SITE_OTHER): Payer: Self-pay | Admitting: Ophthalmology

## 2020-04-28 ENCOUNTER — Encounter (INDEPENDENT_AMBULATORY_CARE_PROVIDER_SITE_OTHER): Payer: Medicare PPO | Admitting: Ophthalmology

## 2020-05-01 ENCOUNTER — Other Ambulatory Visit: Payer: Self-pay

## 2020-05-01 ENCOUNTER — Encounter: Payer: Self-pay | Admitting: Podiatry

## 2020-05-01 ENCOUNTER — Ambulatory Visit (INDEPENDENT_AMBULATORY_CARE_PROVIDER_SITE_OTHER): Payer: Medicare PPO | Admitting: Podiatry

## 2020-05-01 DIAGNOSIS — B351 Tinea unguium: Secondary | ICD-10-CM | POA: Diagnosis not present

## 2020-05-01 DIAGNOSIS — N183 Chronic kidney disease, stage 3 unspecified: Secondary | ICD-10-CM

## 2020-05-01 DIAGNOSIS — E119 Type 2 diabetes mellitus without complications: Secondary | ICD-10-CM

## 2020-05-01 DIAGNOSIS — M79675 Pain in left toe(s): Secondary | ICD-10-CM | POA: Diagnosis not present

## 2020-05-01 DIAGNOSIS — M79674 Pain in right toe(s): Secondary | ICD-10-CM

## 2020-05-01 NOTE — Progress Notes (Signed)
Patient ID: Micheal Morris, male   DOB: 02/09/1946, 74 y.o.   MRN: [id number] Complaint:  Visit Type: Patient returns to my office for continued preventative foot care services. Complaint: Patient states" my nails have grown long and thick and become painful to walk and wear shoes" Patient has been diagnosed with DM with  neuropathy.. The patient presents for preventative foot care services. No changes to ROS.   Podiatric Exam: Vascular: dorsalis pedis and posterior tibial pulses are palpable bilateral. Capillary return is immediate. Temperature gradient is WNL. Skin turgor WNL  Sensorium: Normal Semmes Weinstein monofilament test. Normal tactile sensation bilaterally. Nail Exam: Pt has thick disfigured discolored nails with subungual debris noted bilateral entire nail hallux through fifth toenails Ulcer Exam: There is no evidence of ulcer or pre-ulcerative changes or infection. Orthopedic Exam: Muscle tone and strength are WNL. No limitations in general ROM. No crepitus or effusions noted. Foot type and digits show no abnormalities. Bony prominences are unremarkable. Skin: No Porokeratosis. No infection or ulcers.    Diagnosis:  Onychomycosis, , Pain in right toe, pain in left toes  Treatment & Plan Procedures and Treatment: Consent by patient was obtained for treatment procedures. The patient understood the discussion of treatment and procedures well. All questions were answered thoroughly reviewed. Debridement of mycotic and hypertrophic toenails, 1 through 5 bilateral and clearing of subungual debris. No ulceration, no infection noted.  Return Visit-Office Procedure: Patient instructed to return to the office for a follow up visit 9   weeks  for continued evaluation and treatment.  Gardiner Barefoot DPM

## 2020-08-05 ENCOUNTER — Other Ambulatory Visit: Payer: Self-pay

## 2020-08-05 ENCOUNTER — Encounter: Payer: Self-pay | Admitting: Podiatry

## 2020-08-05 ENCOUNTER — Ambulatory Visit: Payer: Medicare PPO | Admitting: Podiatry

## 2020-08-05 DIAGNOSIS — N183 Chronic kidney disease, stage 3 unspecified: Secondary | ICD-10-CM

## 2020-08-05 DIAGNOSIS — B351 Tinea unguium: Secondary | ICD-10-CM

## 2020-08-05 DIAGNOSIS — M79675 Pain in left toe(s): Secondary | ICD-10-CM

## 2020-08-05 DIAGNOSIS — E119 Type 2 diabetes mellitus without complications: Secondary | ICD-10-CM | POA: Diagnosis not present

## 2020-08-05 DIAGNOSIS — M79674 Pain in right toe(s): Secondary | ICD-10-CM

## 2020-08-05 NOTE — Progress Notes (Signed)
Patient ID: Micheal Morris, male   DOB: 04-09-1946, 75 y.o.   MRN: [id number] Complaint:  Visit Type: Patient returns to my office for continued preventative foot care services. Complaint: Patient states" my nails have grown long and thick and become painful to walk and wear shoes" Patient has been diagnosed with DM with  neuropathy.. The patient presents for preventative foot care services. No changes to ROS.   Podiatric Exam: Vascular: dorsalis pedis and posterior tibial pulses are weakly  palpable bilateral. Capillary return is immediate. Temperature gradient is WNL. Skin turgor WNL  Sensorium: Normal Semmes Weinstein monofilament test. Normal tactile sensation bilaterally. Nail Exam: Pt has thick disfigured discolored nails with subungual debris noted bilateral entire nail hallux through fifth toenails Ulcer Exam: There is no evidence of ulcer or pre-ulcerative changes or infection. Orthopedic Exam: Muscle tone and strength are WNL. No limitations in general ROM. No crepitus or effusions noted. Foot type and digits show no abnormalities. Bony prominences are unremarkable. Skin: No Porokeratosis. No infection or ulcers.  Dry scaly skin. Diagnosis:  Onychomycosis, , Pain in right toe, pain in left toes  Treatment & Plan Procedures and Treatment: Consent by patient was obtained for treatment procedures. The patient understood the discussion of treatment and procedures well. All questions were answered thoroughly reviewed. Debridement of mycotic and hypertrophic toenails, 1 through 5 bilateral and clearing of subungual debris. No ulceration, no infection noted.  Return Visit-Office Procedure: Patient instructed to return to the office for a follow up visit 9   weeks  for continued evaluation and treatment.  Gardiner Barefoot DPM

## 2020-10-07 ENCOUNTER — Other Ambulatory Visit: Payer: Self-pay

## 2020-10-07 ENCOUNTER — Encounter: Payer: Self-pay | Admitting: Podiatry

## 2020-10-07 ENCOUNTER — Ambulatory Visit: Payer: Medicare PPO | Admitting: Podiatry

## 2020-10-07 DIAGNOSIS — M79674 Pain in right toe(s): Secondary | ICD-10-CM

## 2020-10-07 DIAGNOSIS — B351 Tinea unguium: Secondary | ICD-10-CM

## 2020-10-07 DIAGNOSIS — M79675 Pain in left toe(s): Secondary | ICD-10-CM

## 2020-10-07 DIAGNOSIS — N183 Chronic kidney disease, stage 3 unspecified: Secondary | ICD-10-CM

## 2020-10-07 DIAGNOSIS — E119 Type 2 diabetes mellitus without complications: Secondary | ICD-10-CM

## 2020-10-07 NOTE — Progress Notes (Signed)
Patient ID: Micheal Morris, male   DOB: 1946-05-13, 75 y.o.   MRN: [id number] Complaint:  Visit Type: Patient returns to my office for continued preventative foot care services. Complaint: Patient states" my nails have grown long and thick and become painful to walk and wear shoes" Patient has been diagnosed with DM with  neuropathy.. The patient presents for preventative foot care services. No changes to ROS.   Podiatric Exam: Vascular: dorsalis pedis and posterior tibial pulses are weakly  palpable bilateral. Capillary return is immediate. Temperature gradient is WNL. Skin turgor WNL  Sensorium: Normal Semmes Weinstein monofilament test. Normal tactile sensation bilaterally. Nail Exam: Pt has thick disfigured discolored nails with subungual debris noted bilateral entire nail hallux through fifth toenails Ulcer Exam: There is no evidence of ulcer or pre-ulcerative changes or infection. Orthopedic Exam: Muscle tone and strength are WNL. No limitations in general ROM. No crepitus or effusions noted. Foot type and digits show no abnormalities. Bony prominences are unremarkable. Skin: No Porokeratosis. No infection or ulcers.  Dry scaly skin.  Heel callus right foot. Diagnosis:  Onychomycosis, , Pain in right toe, pain in left toes  Treatment & Plan Procedures and Treatment: Consent by patient was obtained for treatment procedures. The patient understood the discussion of treatment and procedures well. All questions were answered thoroughly reviewed. Debridement of mycotic and hypertrophic toenails, 1 through 5 bilateral and clearing of subungual debris. No ulceration, no infection noted.  Return Visit-Office Procedure: Patient instructed to return to the office for a follow up visit 9   weeks  for continued evaluation and treatment.  Gardiner Barefoot DPM

## 2020-12-09 ENCOUNTER — Encounter: Payer: Self-pay | Admitting: Podiatry

## 2020-12-09 ENCOUNTER — Other Ambulatory Visit: Payer: Self-pay

## 2020-12-09 ENCOUNTER — Ambulatory Visit: Payer: Medicare PPO | Admitting: Podiatry

## 2020-12-09 DIAGNOSIS — N183 Chronic kidney disease, stage 3 unspecified: Secondary | ICD-10-CM

## 2020-12-09 DIAGNOSIS — B351 Tinea unguium: Secondary | ICD-10-CM | POA: Diagnosis not present

## 2020-12-09 DIAGNOSIS — M79674 Pain in right toe(s): Secondary | ICD-10-CM | POA: Diagnosis not present

## 2020-12-09 DIAGNOSIS — M79675 Pain in left toe(s): Secondary | ICD-10-CM | POA: Diagnosis not present

## 2020-12-09 DIAGNOSIS — E119 Type 2 diabetes mellitus without complications: Secondary | ICD-10-CM

## 2020-12-09 DIAGNOSIS — I96 Gangrene, not elsewhere classified: Secondary | ICD-10-CM

## 2020-12-09 NOTE — Progress Notes (Signed)
Patient ID: Micheal Morris, male   DOB: May 15, 1946, 75 y.o.   MRN: [id number] Complaint:  Visit Type: Patient returns to my office for continued preventative foot care services. Complaint: Patient states" my nails have grown long and thick and become painful to walk and wear shoes" Patient has been diagnosed with DM with  Neuropathy..This patient has also developed a painful callus on the back of his right foot. The patient presents for preventative foot care services. No changes to ROS.   Podiatric Exam: Vascular: dorsalis pedis and posterior tibial pulses are weakly  palpable bilateral. Capillary return is immediate. Temperature gradient is WNL. Skin turgor WNL  Sensorium: Normal Semmes Weinstein monofilament test. Normal tactile sensation bilaterally. Nail Exam: Pt has thick disfigured discolored nails with subungual debris noted bilateral entire nail hallux through fifth toenails Ulcer Exam: There is no evidence of ulcer or pre-ulcerative changes or infection. Orthopedic Exam: Muscle tone and strength are WNL. No limitations in general ROM. No crepitus or effusions noted. Foot type and digits show no abnormalities. Bony prominences are unremarkable. Skin: No Porokeratosis. No infection or ulcers.  Dry scaly skin.  Heel callus on the outside of his right foot.  Callus cover is loosely attached right foot.  No redness or drainage noted.    Diagnosis:  Onychomycosis, , Pain in right toe, pain in left toes   Callus/skin necrosis right heel.   Treatment & Plan Procedures and Treatment: Consent by patient was obtained for treatment procedures. The patient understood the discussion of treatment and procedures well. All questions were answered thoroughly reviewed. Debridement of mycotic and hypertrophic toenails, 1 through 5 bilateral and clearing of subungual debris  Using a nail nipper followed by dremel tool usage.  Debridement of callus right heel with # 15 blade.  The center of the callus reveals   3 mm. X 3 mm central ulceration.  Neosporin/DSD applied.  Soaks were recommended for ulcer.  Told him to use pillows and heel cushion which was previously dispensed.  .  no infection noted.  Return Visit-Office Procedure: Patient instructed to return to the office for a follow up visit 9   weeks  for continued evaluation and treatment.  Gardiner Barefoot DPM

## 2021-02-05 ENCOUNTER — Ambulatory Visit: Payer: Medicare PPO | Admitting: Cardiology

## 2021-02-12 ENCOUNTER — Encounter: Payer: Self-pay | Admitting: Podiatry

## 2021-02-12 ENCOUNTER — Other Ambulatory Visit: Payer: Self-pay

## 2021-02-12 ENCOUNTER — Ambulatory Visit: Payer: Medicare PPO | Admitting: Podiatry

## 2021-02-12 DIAGNOSIS — N183 Chronic kidney disease, stage 3 unspecified: Secondary | ICD-10-CM

## 2021-02-12 DIAGNOSIS — E119 Type 2 diabetes mellitus without complications: Secondary | ICD-10-CM

## 2021-02-12 DIAGNOSIS — M79675 Pain in left toe(s): Secondary | ICD-10-CM

## 2021-02-12 DIAGNOSIS — M79674 Pain in right toe(s): Secondary | ICD-10-CM

## 2021-02-12 DIAGNOSIS — B351 Tinea unguium: Secondary | ICD-10-CM

## 2021-02-12 DIAGNOSIS — R609 Edema, unspecified: Secondary | ICD-10-CM

## 2021-02-12 NOTE — Progress Notes (Signed)
Patient ID: Micheal Morris, male   DOB: 04-09-1946, 75 y.o.   MRN: [id number] Complaint:  Visit Type: Patient returns to my office for continued preventative foot care services. Complaint: Patient states" my nails have grown long and thick and become painful to walk and wear shoes" Patient has been diagnosed with DM with  neuropathy.. The patient presents for preventative foot care services. No changes to ROS.   Podiatric Exam: Vascular: dorsalis pedis and posterior tibial pulses are weakly  palpable bilateral. Capillary return is immediate. Temperature gradient is WNL. Skin turgor WNL  Sensorium: Normal Semmes Weinstein monofilament test. Normal tactile sensation bilaterally. Nail Exam: Pt has thick disfigured discolored nails with subungual debris noted bilateral entire nail hallux through fifth toenails Ulcer Exam: There is no evidence of ulcer or pre-ulcerative changes or infection. Orthopedic Exam: Muscle tone and strength are WNL. No limitations in general ROM. No crepitus or effusions noted. Foot type and digits show no abnormalities. Bony prominences are unremarkable. Skin: No Porokeratosis. No infection or ulcers.  Dry scaly skin. Diagnosis:  Onychomycosis, , Pain in right toe, pain in left toes  Treatment & Plan Procedures and Treatment: Consent by patient was obtained for treatment procedures. The patient understood the discussion of treatment and procedures well. All questions were answered thoroughly reviewed. Debridement of mycotic and hypertrophic toenails, 1 through 5 bilateral and clearing of subungual debris. No ulceration, no infection noted.  Return Visit-Office Procedure: Patient instructed to return to the office for a follow up visit 9   weeks  for continued evaluation and treatment.  Gardiner Barefoot DPM

## 2021-02-17 ENCOUNTER — Other Ambulatory Visit: Payer: Self-pay

## 2021-02-17 ENCOUNTER — Encounter: Payer: Self-pay | Admitting: Cardiology

## 2021-02-17 ENCOUNTER — Ambulatory Visit: Payer: Medicare PPO | Admitting: Cardiology

## 2021-02-17 VITALS — BP 128/68 | HR 98 | Ht 70.0 in | Wt 282.0 lb

## 2021-02-17 DIAGNOSIS — R0609 Other forms of dyspnea: Secondary | ICD-10-CM

## 2021-02-17 DIAGNOSIS — E7800 Pure hypercholesterolemia, unspecified: Secondary | ICD-10-CM

## 2021-02-17 DIAGNOSIS — R0789 Other chest pain: Secondary | ICD-10-CM

## 2021-02-17 DIAGNOSIS — I10 Essential (primary) hypertension: Secondary | ICD-10-CM

## 2021-02-17 DIAGNOSIS — R0600 Dyspnea, unspecified: Secondary | ICD-10-CM

## 2021-02-17 DIAGNOSIS — R072 Precordial pain: Secondary | ICD-10-CM

## 2021-02-17 LAB — BASIC METABOLIC PANEL
BUN/Creatinine Ratio: 18 (ref 10–24)
BUN: 34 mg/dL — ABNORMAL HIGH (ref 8–27)
CO2: 22 mmol/L (ref 20–29)
Calcium: 9.4 mg/dL (ref 8.6–10.2)
Chloride: 97 mmol/L (ref 96–106)
Creatinine, Ser: 1.86 mg/dL — ABNORMAL HIGH (ref 0.76–1.27)
Glucose: 219 mg/dL — ABNORMAL HIGH (ref 65–99)
Potassium: 4.4 mmol/L (ref 3.5–5.2)
Sodium: 135 mmol/L (ref 134–144)
eGFR: 38 mL/min/{1.73_m2} — ABNORMAL LOW (ref >59)

## 2021-02-17 MED ORDER — METOPROLOL TARTRATE 100 MG PO TABS: 100.0000 mg | ORAL_TABLET | Freq: Once | ORAL | 0 refills | Status: DC

## 2021-02-17 NOTE — Progress Notes (Addendum)
Cardiology CONSULT Note    Date:  02/17/2021   ID:  Micheal Morris, DOB [birth year], MRN [id number]  PCP:  Chesley Noon, MD  Cardiologist:  Fransico Him, MD   Chief Complaint  Patient presents with   New Patient (Initial Visit)    SOB, chest pain, HTN, HLD     History of Present Illness:  Micheal Morris is a 75 y.o. male who is being seen today for the evaluation of HTN, HLD and reestablish cardiac care at the request of Chesley Noon, MD.  Micheal Morris is a 75yo AAM with a hx of CKD, DM, GERD, HTN, HLD who was previously seen by Dr. Harrington Challenger in 2017 for HTN, SOB and HLD.  His lipids and HTN are now followed by PCP.  Nuclear stress test for SOB in 2016 showed no ischemia and 2D echo in 2019 showed normal LVF with moderate LVH and G1DD.    He is here today for followup and is doing well.  He tells me that he has recently been having some problems with exertional fatigue and also has been noticing bendopnea.  He has gained some weight and has a large stomach.  He also has noticed that he will get some pressure in his chest when picking up his grandchild or bending over.  He has not really noticed any problems with exertional CP.  He denies any PND, orthopnea, dizziness, palpitations or syncope. He has chronic LE edema controlled on diuretics and compression hose.  He is compliant with his meds and is tolerating meds with no SE.     Past Medical History:  Diagnosis Date   Arthritis    "knees" (01/01/2016)   Cataract    OS   CHF (congestive heart failure) (HCC)    Chronic kidney disease    Diabetes mellitus (HCC)    Diabetic retinopathy ([place])    NPDR OU   Difficult intubation    Erectile dysfunction    GERD (gastroesophageal reflux disease)    Glaucoma    OS   HTN (hypertension)    Hyperlipidemia    Neuromuscular disorder (HCC)    neuropathy   Obesity    OSA on CPAP    Pain of left upper arm    Restrictive lung disease    Sensory hearing loss, bilateral    Sleep apnea     SOBOE (shortness of breath on exertion) 11/03/2015   currently goes to gym   Type II diabetes mellitus [facility])     Past Surgical History:  Procedure Laterality Date   CATARACT EXTRACTION Right 03/23/2020   Dr. Marylynn Pearson   COLONOSCOPY     EYE SURGERY Right 03/23/2020   Cat Sx - Dr. Marylynn Pearson   GLAUCOMA SURGERY Bilateral    "laser"   JOINT REPLACEMENT Left    knee   LAPAROSCOPIC CHOLECYSTECTOMY  09/17/2018   TOTAL KNEE ARTHROPLASTY Left 11/09/2015   Procedure: LEFT TOTAL KNEE ARTHROPLASTY;  Surgeon: Gaynelle Arabian, MD;  Location: [facility];  Service: Orthopedics;  Laterality: Left;    Current Medications: Current Meds  Medication Sig   amLODipine-benazepril (LOTREL) 10-20 MG capsule    amoxicillin (AMOXIL) 500 MG capsule TAKE 4 CAPSULE 1HR PRIOR TO DENTAL APPT   aspirin 81 MG chewable tablet Chew 81 mg by mouth daily.   BD PEN NEEDLE NANO U/F 32G X 4 MM MISC USE AS DIRECTED 4 TIMES A DAY   bimatoprost (LUMIGAN) 0.01 % SOLN Place 1 drop into  the left eye at bedtime.    Blood Glucose Monitoring Suppl (ACCU-CHEK GUIDE) w/Device KIT    brimonidine-timolol (COMBIGAN) 0.2-0.5 % ophthalmic solution Place 1 drop into the left eye 2 (two) times daily.    docusate sodium (COLACE) 100 MG capsule    fluticasone (FLONASE) 50 MCG/ACT nasal spray    gabapentin (NEURONTIN) 300 MG capsule    HUMALOG KWIKPEN 100 UNIT/ML KiwkPen Inject 0-60 Units into the skin 3 (three) times daily. Per sliding scale three times daily with meals   JARDIANCE 25 MG TABS tablet Take 25 mg by mouth daily.   Lancets (ONETOUCH ULTRASOFT) lancets USE AS INSTRUCTED   LANTUS SOLOSTAR 100 UNIT/ML Solostar Pen    linaclotide (LINZESS) 145 MCG CAPS capsule    liraglutide (VICTOZA) 18 MG/3ML SOPN    metoCLOPramide (REGLAN) 10 MG tablet Take 1 tablet (10 mg total) by mouth every 6 (six) hours as needed for nausea.   metoprolol succinate (TOPROL-XL) 100 MG 24 hr tablet Take 1 tablet by mouth daily.   mirabegron ER  (MYRBETRIQ) 50 MG TB24 tablet TAKE 1 TABLET BY MOUTH EVERY DAY   Multiple Vitamin (MULTIVITAMIN) tablet Take 1 tablet by mouth daily. Centrum silver   NOVOLOG FLEXPEN 100 UNIT/ML FlexPen    ONE TOUCH ULTRA TEST test strip    pantoprazole (PROTONIX) 40 MG tablet TAKE 1 TABLET BY MOUTH EVERY DAY   prednisoLONE acetate (PRED FORTE) 1 % ophthalmic suspension Place 1 drop into the right eye every hour while awake.    Probiotic Product ([place]) CAPS Take 1 capsule by mouth daily.   RHOPRESSA 0.02 % SOLN Place 1 drop into both eyes every evening.    simvastatin (ZOCOR) 20 MG tablet Take 20 mg by mouth daily at 6 PM.   spironolactone (ALDACTONE) 25 MG tablet Take 25 mg by mouth daily.    torsemide (DEMADEX) 20 MG tablet TAKE 1 TABLET BY MOUTH EVERY DAY   UNABLE TO FIND TAKE 2 TEASPOONS(10MLS) BY MOUTH 4 TIMES A DAY FOR 5 DAYS   vardenafil (LEVITRA) 20 MG tablet Take 20 mg by mouth as needed for erectile dysfunction.     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  Tobacco Use   Smoking status: Never   Smokeless tobacco: Never  Vaping Use   Vaping Use: Never used  Substance and Sexual Activity   Alcohol use: No   Drug use: No   Sexual activity: Not Currently  Other Topics Concern   Not on file  Social History Narrative   Not on file   Social Determinants of Health   Financial Resource Strain: Not on file  Food Insecurity: Not on file  Transportation Needs: Not on file  Physical Activity: Not on file  Stress: Not on file  Social Connections: Not on file     Family History:  The patient's family history includes Diabetes in his brother, mother, and sister; Heart disease in his mother; Hypertension in his brother and  mother; Stroke in his mother.   ROS:   Please see the history of present illness.    ROS All other systems reviewed and are negative.  No flowsheet data found.  PHYSICAL EXAM:   VS:  BP 128/68   Pulse 98   Ht '5\' 10"'  (1.778 m)   Wt 282 lb (127.9 kg)   SpO2 96%   BMI 40.46 kg/m    GEN: Well nourished, well developed, in no acute distress  HEENT: normal  Neck: no JVD, carotid bruits, or masses Cardiac: RRR; no murmurs, rubs, or gallops,no edema.  Intact distal pulses bilaterally.  Respiratory:  clear to auscultation bilaterally, normal work of breathing GI: soft, nontender, nondistended, + BS MS: no deformity or atrophy  Skin: warm and dry, no rash Neuro:  Alert and Oriented x 3, Strength and sensation are intact Psych: euthymic mood, full affect  Wt Readings from Last 3 Encounters:  02/17/21 282 lb (127.9 kg)  08/25/19 275 lb (124.7 kg)  06/09/18 275 lb (124.7 kg)      Studies/Labs Reviewed:   EKG:  EKG is ordered today.  The ekg ordered today demonstrates NSR with nonspecific T wave abnormality  Recent Labs: No results found for requested labs within last 8760 hours.   Lipid Panel No results found for: CHOL, TRIG, HDL, CHOLHDL, VLDL, LDLCALC, LDLDIRECT   Additional studies/ records that were reviewed today include:  OV notes from PCP    ASSESSMENT:    1. Chest pressure   2. DOE (dyspnea on exertion)   3. Essential hypertension   4. Pure hypercholesterolemia      PLAN:  In order of problems listed above:  Chest pressure -this is very atypical and only occurs with picking up his grandchild -he does have CRFs including DM, HTN, HLD, remote tobacco use and obesity -EKG shows nonspecific T wave abnormalities -I will get a coronary CTA to define coronary anatomy  2.  DOE -likely related to obesity with recent weight gain and deconditioning -I will repeat a 2D echo to assess LVF  3.  HTN -BP controlled on exam today -Continue prescription drug  management with Lotrel 10-25m daily, Toprol XL 1091mdaily and spiro 2553maily with PRN refills  4.  HLD -followed by PCP -LDL was 56 in Aug 2021 -continue Simvastatin 10m71mily    Time Spent: 20 minutes total time of encounter, including 15 minutes spent in face-to-face patient care on the date of this encounter. This time includes coordination of care and counseling regarding above mentioned problem list. Remainder of non-face-to-face time involved reviewing chart documents/testing relevant to the patient encounter and documentation in the medical record. I have independently reviewed documentation from referring provider  Medication Adjustments/Labs and Tests Ordered: Current medicines are reviewed at length with the patient today.  Concerns regarding medicines are outlined above.  Medication changes, Labs and Tests ordered today are listed in the Patient Instructions below.  There are no Patient Instructions on file for this visit.   Signed, TracFransico Him  02/17/2021 11:11 AM    [email or web address]  [id number]: [phone]: [phone]

## 2021-02-17 NOTE — Patient Instructions (Addendum)
Medication Instructions:  Your physician recommends that you continue on your current medications as directed. Please refer to the Current Medication list given to you today.  *If you need a refill on your cardiac medications before your next appointment, please call your pharmacy*   Lab Work: TODAY: BMET If you have labs (blood work) drawn today and your tests are completely normal, you will receive your results only by: [place] (if you have MyChart) OR A paper copy in the mail If you have any lab test that is abnormal or we need to change your treatment, we will call you to review the results.   Testing/Procedures: Your physician has requested that you have an echocardiogram. Echocardiography is a painless test that uses sound waves to create images of your heart. It provides your doctor with information about the size and shape of your heart and how well your heart's chambers and valves are working. This procedure takes approximately one hour. There are no restrictions for this procedure.  Your physician has requested that you have a coronary CT angiogram. Please see below for further instructions.   Follow-Up: At [facility], you and your health needs are our priority.  As part of our continuing mission to provide you with exceptional heart care, we have created designated Provider Care Teams.  These Care Teams include your primary Cardiologist (physician) and Advanced Practice Providers (APPs -  Physician Assistants and Nurse Practitioners) who all work together to provide you with the care you need, when you need it.  Follow up with Dr. Radford Pax as needed based on results of testing.    Other Instructions   Your cardiac CT will be scheduled at:  [facility] [address] [place], [place] [address] [phone]  Please arrive at the [facility] main entrance (entrance A) of [facility] 30 minutes prior to test start time. Proceed to the [facility] Radiology Department (first floor) to check-in and test prep.  Please follow these instructions carefully (unless otherwise directed):  Hold all erectile dysfunction medications at least 3 days (72 hrs) prior to test.  On the Night Before the Test: Be sure to Drink plenty of water. Do not consume any caffeinated/decaffeinated beverages or chocolate 12 hours prior to your test. Do not take any antihistamines 12 hours prior to your test.  On the Day of the Test: Drink plenty of water until 1 hour prior to the test. Do not eat any food 4 hours prior to the test. You may take your regular medications prior to the test.  Take metoprolol (Lopressor) two hours prior to test. HOLD Toprol (metoprolol succinate) on the day of your test.       After the Test: Drink plenty of water. After receiving IV contrast, you may experience a mild flushed feeling. This is normal. On occasion, you may experience a mild rash up to 24 hours after the test. This is not dangerous. If this occurs, you can take Benadryl 25 mg and increase your fluid intake. If you experience trouble breathing, this can be serious. If it is severe call 911 IMMEDIATELY. If it is mild, please call our office. If you take any of these medications: Glipizide/Metformin, Avandament, Glucavance, please do not take 48 hours after completing test unless otherwise instructed.  Please allow 2-4 weeks for scheduling of routine cardiac CTs. Some insurance companies require a pre-authorization which may delay scheduling of this test.   For non-scheduling related questions, please contact the cardiac  imaging nurse navigator should you have any questions/concerns: Marchia Bond, Cardiac Imaging Nurse Navigator Gordy Clement, Cardiac Imaging Nurse Navigator [facility] Direct Office Dial: [phone]   For scheduling needs, including cancellations and rescheduling, please call [place], [phone].

## 2021-02-17 NOTE — Addendum Note (Signed)
Addended by: Antonieta Iba on: 02/17/2021 11:16 AM   Modules accepted: Orders

## 2021-02-18 ENCOUNTER — Telehealth: Payer: Self-pay

## 2021-02-18 DIAGNOSIS — R072 Precordial pain: Secondary | ICD-10-CM

## 2021-02-18 NOTE — Telephone Encounter (Signed)
-----   Message from Sueanne Margarita, MD sent at 02/17/2021  6:02 PM EDT ----- SCr too high for coronary CTA so please cancel and set up [facility] with coronary Ca score

## 2021-02-18 NOTE — Telephone Encounter (Signed)
The patient has been notified of the result and verbalized understanding.  All questions (if any) were answered. Antonieta Iba, RN 02/18/2021 1:05 PM   CTA has been cancelled.  Lexiscan myoview and calcium score have been ordered.   Patient states that he thinks he had some sort of reaction to the [place] last time he had it done.  Per Note from stress test in 2016: Impression Exercise Capacity:  Lexiscan with no exercise. BP Response:  Normal blood pressure response. Clinical Symptoms:  Felt lightheaded at the beginning of study, shortness of breath. ECG Impression:  Significant bradycardia noted during pharmacologic infusion of Lexiscan. Comparison with Prior Nuclear Study: No images to compare   Will route to Dr. Radford Pax to see if she still wants patient to have lexiscan myoview done and for attestation orders.

## 2021-02-19 NOTE — Telephone Encounter (Signed)
Shared Decision Making/Informed Consent The risks [chest pain, shortness of breath, cardiac arrhythmias, dizziness, blood pressure fluctuations, myocardial infarction, stroke/transient ischemic attack, nausea, vomiting, allergic reaction, radiation exposure, metallic taste sensation and life-threatening complications (estimated to be 1 in 10,000)], benefits (risk stratification, diagnosing coronary artery disease, treatment guidance) and alternatives of a nuclear stress test were discussed in detail with Micheal Morris and he agrees to proceed.

## 2021-02-24 ENCOUNTER — Ambulatory Visit (HOSPITAL_COMMUNITY): Payer: Medicare PPO

## 2021-03-09 ENCOUNTER — Telehealth (HOSPITAL_COMMUNITY): Payer: Self-pay | Admitting: *Deleted

## 2021-03-09 NOTE — Telephone Encounter (Signed)
Patient given detailed instructions per Myocardial Perfusion Study Information Sheet for the test on 03/15/21 at 1:15. Patient notified to arrive 15 minutes early and that it is imperative to arrive on time for appointment to keep from having the test rescheduled.  If you need to cancel or reschedule your appointment, please call the office within 24 hours of your appointment. . Patient verbalized understanding.Micheal Morris

## 2021-03-11 ENCOUNTER — Ambulatory Visit (HOSPITAL_COMMUNITY): Payer: Medicare PPO | Attending: Cardiology

## 2021-03-11 ENCOUNTER — Ambulatory Visit (INDEPENDENT_AMBULATORY_CARE_PROVIDER_SITE_OTHER)
Admission: RE | Admit: 2021-03-11 | Discharge: 2021-03-11 | Disposition: A | Discharge status: Home / Self Care | Payer: Self-pay | Source: Ambulatory Visit | Attending: Cardiology | Admitting: Cardiology

## 2021-03-11 ENCOUNTER — Other Ambulatory Visit: Payer: Self-pay

## 2021-03-11 DIAGNOSIS — E7800 Pure hypercholesterolemia, unspecified: Secondary | ICD-10-CM

## 2021-03-11 DIAGNOSIS — I10 Essential (primary) hypertension: Secondary | ICD-10-CM

## 2021-03-11 DIAGNOSIS — R0600 Dyspnea, unspecified: Secondary | ICD-10-CM | POA: Diagnosis present

## 2021-03-11 DIAGNOSIS — R072 Precordial pain: Secondary | ICD-10-CM

## 2021-03-11 DIAGNOSIS — R0789 Other chest pain: Secondary | ICD-10-CM

## 2021-03-11 DIAGNOSIS — R0609 Other forms of dyspnea: Secondary | ICD-10-CM

## 2021-03-11 LAB — ECHOCARDIOGRAM COMPLETE
AR max vel: 5.58 cm2
AV Area VTI: 4.21 cm2
AV Area mean vel: 4.77 cm2
AV Mean grad: 4 mmHg
AV Peak grad: 7.7 mmHg
Ao pk vel: 1.39 m/s
Area-P 1/2: 7.82 cm2
S' Lateral: 2.7 cm

## 2021-03-15 ENCOUNTER — Other Ambulatory Visit: Payer: Self-pay

## 2021-03-15 ENCOUNTER — Ambulatory Visit (HOSPITAL_COMMUNITY): Payer: Medicare PPO | Attending: Cardiovascular Disease

## 2021-03-15 VITALS — Ht 70.0 in | Wt 282.0 lb

## 2021-03-15 DIAGNOSIS — R110 Nausea: Secondary | ICD-10-CM | POA: Diagnosis present

## 2021-03-15 DIAGNOSIS — R072 Precordial pain: Secondary | ICD-10-CM | POA: Diagnosis not present

## 2021-03-15 MED ORDER — REGADENOSON 0.4 MG/5ML IV SOLN
0.4000 mg | Freq: Once | INTRAVENOUS | Status: AC
  Administered 2021-03-15: 0.4 mg via INTRAVENOUS

## 2021-03-15 MED ORDER — AMINOPHYLLINE 25 MG/ML IV SOLN
150.0000 mg | Freq: Once | INTRAVENOUS | Status: AC
  Administered 2021-03-15: 150 mg via INTRAVENOUS

## 2021-03-15 MED ORDER — TECHNETIUM TC 99M TETROFOSMIN IV KIT
30.1000 | PACK | Freq: Once | INTRAVENOUS | Status: AC | PRN
  Administered 2021-03-15: 30.1 via INTRAVENOUS
  Filled 2021-03-15: qty 31

## 2021-03-16 ENCOUNTER — Ambulatory Visit (HOSPITAL_COMMUNITY): Payer: Medicare PPO | Attending: Cardiovascular Disease

## 2021-03-16 LAB — MYOCARDIAL PERFUSION IMAGING
LV dias vol: 72 mL (ref 62–150)
LV sys vol: 25 mL
Peak HR: 97 {beats}/min
Rest HR: 85 {beats}/min
SDS: 0
SRS: 0
SSS: 0
TID: 1.18

## 2021-03-16 MED ORDER — TECHNETIUM TC 99M TETROFOSMIN IV KIT
30.2000 | PACK | Freq: Once | INTRAVENOUS | Status: AC | PRN
  Administered 2021-03-16: 30.2 via INTRAVENOUS
  Filled 2021-03-16: qty 31

## 2021-04-19 ENCOUNTER — Ambulatory Visit: Payer: Medicare PPO | Admitting: Podiatry

## 2021-06-04 ENCOUNTER — Ambulatory Visit (INDEPENDENT_AMBULATORY_CARE_PROVIDER_SITE_OTHER): Payer: Medicare PPO | Admitting: Podiatry

## 2021-06-04 ENCOUNTER — Other Ambulatory Visit: Payer: Self-pay

## 2021-06-04 ENCOUNTER — Encounter: Payer: Self-pay | Admitting: Podiatry

## 2021-06-04 DIAGNOSIS — M79674 Pain in right toe(s): Secondary | ICD-10-CM | POA: Diagnosis not present

## 2021-06-04 DIAGNOSIS — E119 Type 2 diabetes mellitus without complications: Secondary | ICD-10-CM

## 2021-06-04 DIAGNOSIS — N183 Chronic kidney disease, stage 3 unspecified: Secondary | ICD-10-CM

## 2021-06-04 DIAGNOSIS — M79675 Pain in left toe(s): Secondary | ICD-10-CM | POA: Diagnosis not present

## 2021-06-04 DIAGNOSIS — B351 Tinea unguium: Secondary | ICD-10-CM | POA: Diagnosis not present

## 2021-06-04 NOTE — Progress Notes (Signed)
Patient ID: Micheal Morris, male   DOB: 1946-04-12, 75 y.o.   MRN: [id number] Complaint:  Visit Type: Patient returns to my office for continued preventative foot care services. Complaint: Patient states" my nails have grown long and thick and become painful to walk and wear shoes" Patient has been diagnosed with DM with  neuropathy.. The patient presents for preventative foot care services. No changes to ROS.   Podiatric Exam: Vascular: dorsalis pedis and posterior tibial pulses are weakly  palpable bilateral. Capillary return is immediate. Temperature gradient is WNL. Skin turgor WNL  Sensorium: Normal Semmes Weinstein monofilament test. Normal tactile sensation bilaterally. Nail Exam: Pt has thick disfigured discolored nails with subungual debris noted bilateral entire nail hallux through fifth toenails Ulcer Exam: There is no evidence of ulcer or pre-ulcerative changes or infection. Orthopedic Exam: Muscle tone and strength are WNL. No limitations in general ROM. No crepitus or effusions noted. Foot type and digits show no abnormalities. Bony prominences are unremarkable. Skin: No Porokeratosis. No infection or ulcers.  Dry scaly skin. Diagnosis:  Onychomycosis, , Pain in right toe, pain in left toes  Treatment & Plan Procedures and Treatment: Consent by patient was obtained for treatment procedures. The patient understood the discussion of treatment and procedures well. All questions were answered thoroughly reviewed. Debridement of mycotic and hypertrophic toenails, 1 through 5 bilateral and clearing of subungual debris. No ulceration, no infection noted.  Return Visit-Office Procedure: Patient instructed to return to the office for a follow up visit 9   weeks  for continued evaluation and treatment.  Gardiner Barefoot DPM

## 2021-08-13 ENCOUNTER — Encounter: Payer: Self-pay | Admitting: Podiatry

## 2021-08-13 ENCOUNTER — Other Ambulatory Visit: Payer: Self-pay

## 2021-08-13 ENCOUNTER — Ambulatory Visit: Payer: Medicare PPO | Admitting: Podiatry

## 2021-08-13 DIAGNOSIS — B351 Tinea unguium: Secondary | ICD-10-CM

## 2021-08-13 DIAGNOSIS — R609 Edema, unspecified: Secondary | ICD-10-CM | POA: Diagnosis not present

## 2021-08-13 DIAGNOSIS — E119 Type 2 diabetes mellitus without complications: Secondary | ICD-10-CM | POA: Diagnosis not present

## 2021-08-13 DIAGNOSIS — N183 Chronic kidney disease, stage 3 unspecified: Secondary | ICD-10-CM

## 2021-08-13 DIAGNOSIS — M79674 Pain in right toe(s): Secondary | ICD-10-CM

## 2021-08-13 DIAGNOSIS — M79675 Pain in left toe(s): Secondary | ICD-10-CM

## 2021-08-13 NOTE — Progress Notes (Signed)
Patient ID: Micheal Morris, male   DOB: [birth year], 76 y.o.   MRN: [id number] Complaint:  Visit Type: Patient returns to my office for continued preventative foot care services. Complaint: Patient states" my nails have grown long and thick and become painful to walk and wear shoes" Patient has been diagnosed with DM with  neuropathy.. The patient presents for preventative foot care services. No changes to ROS.   Podiatric Exam: Vascular: dorsalis pedis and posterior tibial pulses are weakly  palpable bilateral. Capillary return is immediate. Temperature gradient is WNL. Skin turgor WNL  Sensorium: Normal Semmes Weinstein monofilament test. Normal tactile sensation bilaterally. Nail Exam: Pt has thick disfigured discolored nails with subungual debris noted bilateral entire nail hallux through fifth toenails Ulcer Exam: There is no evidence of ulcer or pre-ulcerative changes or infection. Orthopedic Exam: Muscle tone and strength are WNL. No limitations in general ROM. No crepitus or effusions noted. Foot type and digits show no abnormalities. Bony prominences are unremarkable. Skin: No Porokeratosis. No infection or ulcers.  Dry scaly skin. Diagnosis:  Onychomycosis, , Pain in right toe, pain in left toes  Treatment & Plan Procedures and Treatment: Consent by patient was obtained for treatment procedures. The patient understood the discussion of treatment and procedures well. All questions were answered thoroughly reviewed. Debridement of mycotic and hypertrophic toenails, 1 through 5 bilateral and clearing of subungual debris. No ulceration, no infection noted.  Return Visit-Office Procedure: Patient instructed to return to the office for a follow up visit 9   weeks  for continued evaluation and treatment.  Gardiner Barefoot DPM

## 2021-10-20 ENCOUNTER — Encounter: Payer: Self-pay | Admitting: Podiatry

## 2021-10-20 ENCOUNTER — Ambulatory Visit: Payer: Medicare PPO | Admitting: Podiatry

## 2021-10-20 ENCOUNTER — Other Ambulatory Visit: Payer: Self-pay

## 2021-10-20 DIAGNOSIS — E119 Type 2 diabetes mellitus without complications: Secondary | ICD-10-CM | POA: Diagnosis not present

## 2021-10-20 DIAGNOSIS — M79675 Pain in left toe(s): Secondary | ICD-10-CM

## 2021-10-20 DIAGNOSIS — M79674 Pain in right toe(s): Secondary | ICD-10-CM | POA: Diagnosis not present

## 2021-10-20 DIAGNOSIS — B351 Tinea unguium: Secondary | ICD-10-CM | POA: Diagnosis not present

## 2021-10-20 DIAGNOSIS — Z794 Long term (current) use of insulin: Secondary | ICD-10-CM | POA: Insufficient documentation

## 2021-10-20 DIAGNOSIS — E1121 Type 2 diabetes mellitus with diabetic nephropathy: Secondary | ICD-10-CM | POA: Insufficient documentation

## 2021-10-20 DIAGNOSIS — N183 Chronic kidney disease, stage 3 unspecified: Secondary | ICD-10-CM

## 2021-10-20 DIAGNOSIS — E1142 Type 2 diabetes mellitus with diabetic polyneuropathy: Secondary | ICD-10-CM | POA: Insufficient documentation

## 2021-10-20 DIAGNOSIS — R0989 Other specified symptoms and signs involving the circulatory and respiratory systems: Secondary | ICD-10-CM

## 2021-10-20 NOTE — Progress Notes (Signed)
Patient ID: Micheal Morris, male   DOB: [birth year], 76 y.o.   MRN: [id number] ?Complaint:  ?Visit Type: Patient returns to my office for continued preventative foot care services. Complaint: Patient states" my nails have grown long and thick and become painful to walk and wear shoes" Patient has been diagnosed with DM with  neuropathy.. The patient presents for preventative foot care services. No changes to ROS.  ? ?Podiatric Exam: ?Vascular: dorsalis pedis and posterior tibial pulses are weakly  palpable bilateral. Capillary return is immediate. Temperature gradient is WNL. Skin turgor WNL  ?Sensorium: Normal Semmes Weinstein monofilament test. Normal tactile sensation bilaterally. ?Nail Exam: Pt has thick disfigured discolored nails with subungual debris noted bilateral entire nail hallux through fifth toenails ?Ulcer Exam: There is no evidence of ulcer or pre-ulcerative changes or infection. ?Orthopedic Exam: Muscle tone and strength are WNL. No limitations in general ROM. No crepitus or effusions noted. Foot type and digits show no abnormalities. Bony prominences are unremarkable. ?Skin: No Porokeratosis. No infection or ulcers.  Dry scaly skin. ?Diagnosis:  ?Onychomycosis, , Pain in right toe, pain in left toes  ?Treatment & Plan ?Procedures and Treatment: Consent by patient was obtained for treatment procedures. The patient understood the discussion of treatment and procedures well. All questions were answered thoroughly reviewed. Debridement of mycotic and hypertrophic toenails, 1 through 5 bilateral and clearing of subungual debris. No ulceration, no infection noted.  ?Return Visit-Office Procedure: Patient instructed to return to the office for a follow up visit 9   weeks  for continued evaluation and treatment. ? ?Gardiner Barefoot DPM ?

## 2021-12-22 ENCOUNTER — Ambulatory Visit: Payer: Medicare PPO | Admitting: Podiatry

## 2021-12-22 ENCOUNTER — Encounter: Payer: Self-pay | Admitting: Podiatry

## 2021-12-22 DIAGNOSIS — N183 Chronic kidney disease, stage 3 unspecified: Secondary | ICD-10-CM

## 2021-12-22 DIAGNOSIS — M79675 Pain in left toe(s): Secondary | ICD-10-CM | POA: Diagnosis not present

## 2021-12-22 DIAGNOSIS — E119 Type 2 diabetes mellitus without complications: Secondary | ICD-10-CM

## 2021-12-22 DIAGNOSIS — B351 Tinea unguium: Secondary | ICD-10-CM | POA: Diagnosis not present

## 2021-12-22 DIAGNOSIS — M79674 Pain in right toe(s): Secondary | ICD-10-CM

## 2021-12-22 NOTE — Progress Notes (Signed)
Patient ID: Micheal Morris, male   DOB: 1946-04-12, 76 y.o.   MRN: [id number] Complaint:  Visit Type: Patient returns to my office for continued preventative foot care services. Complaint: Patient states" my nails have grown long and thick and become painful to walk and wear shoes" Patient has been diagnosed with DM with  neuropathy.. The patient presents for preventative foot care services. No changes to ROS.   Podiatric Exam: Vascular: dorsalis pedis and posterior tibial pulses are weakly  palpable bilateral. Capillary return is immediate. Temperature gradient is WNL. Skin turgor WNL  Sensorium: Normal Semmes Weinstein monofilament test. Normal tactile sensation bilaterally. Nail Exam: Pt has thick disfigured discolored nails with subungual debris noted bilateral entire nail hallux through fifth toenails Ulcer Exam: There is no evidence of ulcer or pre-ulcerative changes or infection. Orthopedic Exam: Muscle tone and strength are WNL. No limitations in general ROM. No crepitus or effusions noted. Foot type and digits show no abnormalities. Bony prominences are unremarkable. Skin: No Porokeratosis. No infection or ulcers.  Dry scaly skin. Diagnosis:  Onychomycosis, , Pain in right toe, pain in left toes  Treatment & Plan Procedures and Treatment: Consent by patient was obtained for treatment procedures. The patient understood the discussion of treatment and procedures well. All questions were answered thoroughly reviewed. Debridement of mycotic and hypertrophic toenails, 1 through 5 bilateral and clearing of subungual debris. No ulceration, no infection noted.  Return Visit-Office Procedure: Patient instructed to return to the office for a follow up visit 9   weeks  for continued evaluation and treatment.  Gardiner Barefoot DPM

## 2022-03-07 ENCOUNTER — Ambulatory Visit: Payer: Medicare PPO | Admitting: Podiatry

## 2022-03-07 ENCOUNTER — Encounter: Payer: Self-pay | Admitting: Podiatry

## 2022-03-07 DIAGNOSIS — I96 Gangrene, not elsewhere classified: Secondary | ICD-10-CM | POA: Diagnosis not present

## 2022-03-07 DIAGNOSIS — M79675 Pain in left toe(s): Secondary | ICD-10-CM

## 2022-03-07 DIAGNOSIS — N183 Chronic kidney disease, stage 3 unspecified: Secondary | ICD-10-CM | POA: Diagnosis not present

## 2022-03-07 DIAGNOSIS — M79674 Pain in right toe(s): Secondary | ICD-10-CM | POA: Diagnosis not present

## 2022-03-07 DIAGNOSIS — B351 Tinea unguium: Secondary | ICD-10-CM

## 2022-03-07 DIAGNOSIS — E119 Type 2 diabetes mellitus without complications: Secondary | ICD-10-CM | POA: Diagnosis not present

## 2022-03-07 DIAGNOSIS — R0989 Other specified symptoms and signs involving the circulatory and respiratory systems: Secondary | ICD-10-CM | POA: Diagnosis not present

## 2022-03-07 NOTE — Progress Notes (Addendum)
Patient ID: Micheal Morris, male   DOB: 08/27/1945, 76 y.o.   MRN: [id number] Complaint:  Visit Type: Patient returns to my office for continued preventative foot care services. Complaint: Patient states" my nails have grown long and thick and become painful to walk and wear shoes" Patient has been diagnosed with DM with  neuropathy and CKD  He presents to the office with his wife who desires to see treatment of his thickened callus on his right big toe.  He says the toe became thickened  and believes it is due to wearing a new pair of shoes.  He denies drainage or redness at the tip of right big toe.  The patient presents for preventative foot care services. No changes to ROS.   Podiatric Exam: Vascular: dorsalis pedis and posterior tibial pulses are weakly  palpable bilateral. Capillary return is immediate. Temperature gradient is WNL. Skin turgor WNL  Sensorium: Normal Semmes Weinstein monofilament test. Normal tactile sensation bilaterally. Nail Exam: Pt has thick disfigured discolored nails with subungual debris noted bilateral entire nail hallux through fifth toenails Ulcer Exam: There is no evidence of ulcer . Orthopedic Exam: Muscle tone and strength are WNL. No limitations in general ROM. No crepitus or effusions noted. Foot type and digits show no abnormalities. Bony prominences are unremarkable. Skin: No Porokeratosis. No infection or ulcers.  Dry scaly skin.  There is skin necrosis and callus thickening at the distal aspect of his right hallux.  No redness or infection noted  Skin necrosis lateral aspect right hee.  Diagnosis:  Onychomycosis, , Pain in right toe, pain in left toes Skin necrosis/Callus right hallux. Treatment & Plan Procedures and Treatment: Consent by patient was obtained for treatment procedures. The patient understood the discussion of treatment and procedures well. All questions were answered thoroughly reviewed. Debridement of mycotic and hypertrophic toenails, 1  through 5 bilateral and clearing of subungual debris.with nail nipper followed by dremel tool usage. Callus/skin necrosis was debrided with # 15 blade and dremel tool exposing intact skin with no evidence of an ulcer.  I contacted Dr.  Amalia Hailey for second opinion.  He recommended we treat the newly debrided callus site with silvadene.  We also agreed to order vascular studies.  The patient is to call the office if this condition worsens.    RTC 9 weeks for nail care.  Patient to follow up with Dr.  Amalia Hailey after the vascular studies in about 2 weeks. Patient was prescribed a surgical shoe to wear until this skin site heals. Return Visit-Office Procedure: Patient instructed to return to the office for a follow up visit 9   weeks  for continued evaluation and treatment.  Gardiner Barefoot DPM

## 2022-03-10 ENCOUNTER — Ambulatory Visit (HOSPITAL_COMMUNITY)
Admission: RE | Admit: 2022-03-10 | Discharge: 2022-03-10 | Disposition: A | Discharge status: Home / Self Care | Payer: Medicare PPO | Source: Ambulatory Visit | Attending: Vascular Surgery | Admitting: Vascular Surgery

## 2022-03-10 DIAGNOSIS — R0989 Other specified symptoms and signs involving the circulatory and respiratory systems: Secondary | ICD-10-CM

## 2022-03-10 DIAGNOSIS — I96 Gangrene, not elsewhere classified: Secondary | ICD-10-CM | POA: Diagnosis not present

## 2022-03-10 DIAGNOSIS — E119 Type 2 diabetes mellitus without complications: Secondary | ICD-10-CM | POA: Diagnosis not present

## 2022-04-01 ENCOUNTER — Ambulatory Visit: Payer: Medicare PPO | Admitting: Podiatry

## 2022-04-01 DIAGNOSIS — E0843 Diabetes mellitus due to underlying condition with diabetic autonomic (poly)neuropathy: Secondary | ICD-10-CM

## 2022-04-01 DIAGNOSIS — L97512 Non-pressure chronic ulcer of other part of right foot with fat layer exposed: Secondary | ICD-10-CM | POA: Diagnosis not present

## 2022-04-01 NOTE — Progress Notes (Signed)
Chief Complaint  Patient presents with   Follow-up    R Great toe and heel, the patient states that things are healing well.    Subjective:  76 y.o. male with PMHx of diabetes mellitus presenting today for follow-up evaluation of an ulcer to the right great toe as well as the heel.  Patient states that they are doing very well.  His wife has been applying the antibiotic cream as instructed.  She has noticed significant overall improvement since last visit.  She presents for further treatment and evaluation   Past Medical History:  Diagnosis Date   Arthritis    "knees" (01/01/2016)   Cataract    OS   CHF (congestive heart failure) (HCC)    Chronic kidney disease    Diabetes mellitus ([place])    Diabetic retinopathy ([place])    NPDR OU   Difficult intubation    Erectile dysfunction    GERD (gastroesophageal reflux disease)    Glaucoma    OS   HTN (hypertension)    Hyperlipidemia    Neuromuscular disorder (HCC)    neuropathy   Obesity    OSA on CPAP    Pain of left upper arm    Restrictive lung disease    Sensory hearing loss, bilateral    Sleep apnea    SOBOE (shortness of breath on exertion) 11/03/2015   currently goes to gym   Type II diabetes mellitus [facility])     Past Surgical History:  Procedure Laterality Date   CATARACT EXTRACTION Right 03/23/2020   Dr. Marylynn Pearson   COLONOSCOPY     EYE SURGERY Right 03/23/2020   Cat Sx - Dr. Marylynn Pearson   GLAUCOMA SURGERY Bilateral    "laser"   JOINT REPLACEMENT Left    knee   LAPAROSCOPIC CHOLECYSTECTOMY  09/17/2018   TOTAL KNEE ARTHROPLASTY Left 11/09/2015   Procedure: LEFT TOTAL KNEE ARTHROPLASTY;  Surgeon: Gaynelle Arabian, MD;  Location: [facility];  Service: Orthopedics;  Laterality: Left;    Allergies  Allergen Reactions   Nsaids Other (See Comments)    GI BLEED   Aspirin Other (See Comments)    STOMACH ULCER  STOMACH ULCER  STOMACH ULCER  STOMACH ULCER  STOMACH ULCER  STOMACH ULCER  STOMACH ULCER    Other Other  (See Comments)    blisters     Objective/Physical Exam General: The patient is alert and oriented x3 in no acute distress.  Dermatology:  The ulcer to the right lateral heel has healed completely.  Complete reepithelialization has occurred.  There is no open wound.  The ulcer to the right great toe has improved significantly.  It only measures now approximately 0.1 x 0.1 x 0.1 cm.  There is a small focal area of granular tissue and open wound remaining however overall significant improvement.  No drainage.  No malodor.  Good routine healing of the wound  Vascular: Palpable pedal pulses bilaterally. No edema or erythema noted. Capillary refill within normal limits. VAS [place] ABI W/WO TBI 03/10/2022 Summary:  Right: Resting right ankle-brachial index is within normal range. No  evidence of significant right lower extremity arterial disease. The right  toe-brachial index is normal.   Left: Resting left ankle-brachial index is within normal range. No  evidence of significant left lower extremity arterial disease. The left  toe-brachial index is normal.   Neurological: Light touch and protective threshold diminished bilaterally.   Musculoskeletal Exam: No prior amputations noted.  No pedal deformity  Assessment: 1.  Ulcer right great toe and posterior heel secondary to diabetes mellitus 2. diabetes mellitus w/ peripheral neuropathy   Plan of Care:  1. Patient was evaluated. 2.  Light excisional debridement of the right great toe wound including subcutaneous tissue was performed using a tissue nipper and a chisel blade. Excisional debridement of all the necrotic nonviable tissue down to healthy bleeding viable tissue was performed with post-debridement measurements same as pre-. 3. the wound was cleansed and dry sterile dressing applied. 4.  Continue Silvadene cream daily 5.  Return to clinic in 4 weeks for follow-up evaluation of the ulcer.  He has an appointment in approximately 8 weeks  for routine foot care with Dr. Alcide Evener, DPM [facility]  Dr. Edrick Kins, DPM    [address], [place] [address]                Office [phone]  Fax [phone]

## 2022-04-13 ENCOUNTER — Ambulatory Visit: Payer: Medicare PPO | Admitting: Podiatry

## 2022-04-13 DIAGNOSIS — E0843 Diabetes mellitus due to underlying condition with diabetic autonomic (poly)neuropathy: Secondary | ICD-10-CM

## 2022-04-13 DIAGNOSIS — L97512 Non-pressure chronic ulcer of other part of right foot with fat layer exposed: Secondary | ICD-10-CM

## 2022-04-13 MED ORDER — DOXYCYCLINE HYCLATE 100 MG PO TABS: 100.0000 mg | ORAL_TABLET | Freq: Two times a day (BID) | ORAL | 0 refills | Status: DC

## 2022-04-13 NOTE — Progress Notes (Signed)
Chief Complaint  Patient presents with   Toe Pain    Patient is here for follow-up for great toe right foot and the heel of right foot callous that is sore to the touch.     Subjective:  76 y.o. male with PMHx of diabetes mellitus presenting for follow-up evaluation of an ulcer to the distal tip of the right great toe as well as heel pain to the lateral aspect of the right heel.  Patient states that recently he has been developing pain and tenderness associated to the lateral aspect of the right heel.  They continue to apply the Silvadene cream daily with a light dressing.   Past Medical History:  Diagnosis Date   Arthritis    "knees" (01/01/2016)   Cataract    OS   CHF (congestive heart failure) (HCC)    Chronic kidney disease    Diabetes mellitus (HCC)    Diabetic retinopathy ([place])    NPDR OU   Difficult intubation    Erectile dysfunction    GERD (gastroesophageal reflux disease)    Glaucoma    OS   HTN (hypertension)    Hyperlipidemia    Neuromuscular disorder (HCC)    neuropathy   Obesity    OSA on CPAP    Pain of left upper arm    Restrictive lung disease    Sensory hearing loss, bilateral    Sleep apnea    SOBOE (shortness of breath on exertion) 11/03/2015   currently goes to gym   Type II diabetes mellitus [facility])     Past Surgical History:  Procedure Laterality Date   CATARACT EXTRACTION Right 03/23/2020   Dr. Marylynn Pearson   COLONOSCOPY     EYE SURGERY Right 03/23/2020   Cat Sx - Dr. Marylynn Pearson   GLAUCOMA SURGERY Bilateral    "laser"   JOINT REPLACEMENT Left    knee   LAPAROSCOPIC CHOLECYSTECTOMY  09/17/2018   TOTAL KNEE ARTHROPLASTY Left 11/09/2015   Procedure: LEFT TOTAL KNEE ARTHROPLASTY;  Surgeon: Gaynelle Arabian, MD;  Location: [facility];  Service: Orthopedics;  Laterality: Left;    Allergies  Allergen Reactions   Nsaids Other (See Comments)    GI BLEED   Aspirin Other (See Comments)    STOMACH ULCER  STOMACH ULCER  STOMACH ULCER  STOMACH  ULCER  STOMACH ULCER  STOMACH ULCER  STOMACH ULCER    Other Other (See Comments)    blisters        Objective/Physical Exam General: The patient is alert and oriented x3 in no acute distress.  Dermatology:  Wound #1 noted to the distal tip of the right great toe measuring approximately 1.0 x 1.0 x 0.1 cm (LxWxD).   To the noted ulceration(s), there is no eschar. There is a moderate amount of slough, fibrin, and necrotic tissue noted. Granulation tissue and wound base is red. There is a minimal amount of serosanguineous drainage noted. There is no exposed bone muscle-tendon ligament or joint. There is no malodor. Periwound integrity is intact. Skin is warm, dry and supple bilateral lower extremities.  Preulcerative callus lesion noted to the lateral aspect of the right heel.  There is no open wound with debridement.  There was an excessive amount of hyperkeratotic skin around this area which was debrided and there was no bleeding or open wound underneath the callus tissue  Vascular: Palpable pedal pulses bilaterally. No edema or erythema noted. Capillary refill within normal limits.  Neurological: Epicritic and protective threshold diminished  bilaterally.   Musculoskeletal Exam: No prior amputations.  No pedal deformity.  Assessment: 1.  Ulcer distal tip of the right hallux secondary to diabetes mellitus 2. diabetes mellitus w/ peripheral neuropathy 3 symptomatic preulcerative callus lesion right heel  Plan of Care:  1. Patient was evaluated. 2. medically necessary excisional debridement including subcutaneous tissue was performed using a tissue nipper and a chisel blade. Excisional debridement of all the necrotic nonviable tissue down to healthy bleeding viable tissue was performed with post-debridement measurements same as pre-. 3. the wound was cleansed and dry sterile dressing applied. 4.  Continue Silvadene cream with a light dressing daily  5.  Patient is to return to  clinic next scheduled appointment   Edrick Kins, DPM [facility]  Dr. Edrick Kins, DPM    [address], [place] [address]                Office [phone]  Fax [phone]

## 2022-05-04 ENCOUNTER — Ambulatory Visit: Payer: Medicare PPO | Admitting: Podiatry

## 2022-05-04 DIAGNOSIS — L97512 Non-pressure chronic ulcer of other part of right foot with fat layer exposed: Secondary | ICD-10-CM

## 2022-05-04 DIAGNOSIS — E0843 Diabetes mellitus due to underlying condition with diabetic autonomic (poly)neuropathy: Secondary | ICD-10-CM | POA: Diagnosis not present

## 2022-05-04 NOTE — Progress Notes (Signed)
Chief Complaint  Patient presents with   Follow-up    follow up 4 weeks ulcer to right great toe- patient is applying silvadene cream once a day.     Subjective:  76 y.o. male with PMHx of diabetes mellitus presenting for follow-up evaluation of an ulcer to the distal tip of the right great toe as well as heel pain to the lateral aspect of the right heel.  Patient believes the wound is about the same to the distal tip of the toe.  He continues to have some slight sensitivity and tenderness to the posterior heel as well.  Past Medical History:  Diagnosis Date   Arthritis    "knees" (01/01/2016)   Cataract    OS   CHF (congestive heart failure) (HCC)    Chronic kidney disease    Diabetes mellitus (HCC)    Diabetic retinopathy ([place])    NPDR OU   Difficult intubation    Erectile dysfunction    GERD (gastroesophageal reflux disease)    Glaucoma    OS   HTN (hypertension)    Hyperlipidemia    Neuromuscular disorder (HCC)    neuropathy   Obesity    OSA on CPAP    Pain of left upper arm    Restrictive lung disease    Sensory hearing loss, bilateral    Sleep apnea    SOBOE (shortness of breath on exertion) 11/03/2015   currently goes to gym   Type II diabetes mellitus [facility])     Past Surgical History:  Procedure Laterality Date   CATARACT EXTRACTION Right 03/23/2020   Dr. Marylynn Pearson   COLONOSCOPY     EYE SURGERY Right 03/23/2020   Cat Sx - Dr. Marylynn Pearson   GLAUCOMA SURGERY Bilateral    "laser"   JOINT REPLACEMENT Left    knee   LAPAROSCOPIC CHOLECYSTECTOMY  09/17/2018   TOTAL KNEE ARTHROPLASTY Left 11/09/2015   Procedure: LEFT TOTAL KNEE ARTHROPLASTY;  Surgeon: Gaynelle Arabian, MD;  Location: [facility];  Service: Orthopedics;  Laterality: Left;    Allergies  Allergen Reactions   Nsaids Other (See Comments)    GI BLEED   Aspirin Other (See Comments)    STOMACH ULCER  STOMACH ULCER  STOMACH ULCER  STOMACH ULCER  STOMACH ULCER  STOMACH ULCER  STOMACH ULCER     Other Other (See Comments)    blisters       Objective/Physical Exam General: The patient is alert and oriented x3 in no acute distress.  Dermatology:  Wound #1 noted to the distal tip of the right great toe measuring approximately 1.0 x 1.0 x 0.1 cm (LxWxD).   To the noted ulceration(s), there is no eschar. There is a moderate amount of slough, fibrin, and necrotic tissue noted. Granulation tissue and wound base is red. There is a minimal amount of serosanguineous drainage noted. There is no exposed bone muscle-tendon ligament or joint. There is no malodor. Periwound integrity is intact. Skin is warm, dry and supple bilateral lower extremities.  Preulcerative callus lesion noted to the lateral aspect of the right heel appears stable..  There is no open wound.  Vascular: Palpable pedal pulses bilaterally. No edema or erythema noted. Capillary refill within normal limits.  Neurological: Epicritic and protective threshold diminished bilaterally.   Musculoskeletal Exam: No prior amputations.  No pedal deformity.  Assessment: 1.  Ulcer distal tip of the right hallux secondary to diabetes mellitus 2. diabetes mellitus w/ peripheral neuropathy 3 symptomatic preulcerative callus lesion  right heel; stable  Plan of Care:  1. Patient was evaluated. 2. medically necessary excisional debridement including subcutaneous tissue was performed using a tissue nipper and a chisel blade. Excisional debridement of all the necrotic nonviable tissue down to healthy bleeding viable tissue was performed with post-debridement measurements same as pre-. 3. the wound was cleansed and dry sterile dressing applied. 4.  Discontinue Silvadene cream.  Silver alginate dressings were provided for the patient to apply to the wound daily with a light Band-Aid 5.  Patient believes that his compression socks are rubbing against the toe.  Recommend open toed compression hose 6.  Return to clinic 3 weeks   Edrick Kins, DPM [facility]  Dr. Edrick Kins, DPM    [address], [place] [address]                Office [phone]  Fax [phone]

## 2022-05-25 ENCOUNTER — Ambulatory Visit: Payer: Medicare PPO | Admitting: Podiatry

## 2022-05-25 DIAGNOSIS — L97512 Non-pressure chronic ulcer of other part of right foot with fat layer exposed: Secondary | ICD-10-CM

## 2022-05-25 DIAGNOSIS — E0843 Diabetes mellitus due to underlying condition with diabetic autonomic (poly)neuropathy: Secondary | ICD-10-CM

## 2022-06-01 NOTE — Progress Notes (Signed)
Chief Complaint  Patient presents with   Follow-up    Patient is here for right foot great toe, patient states that healing is slow.    Subjective:  76 y.o. male with PMHx of diabetes mellitus presenting for follow-up evaluation of an ulcer to the distal tip of the right great toe as well as heel pain to the lateral aspect of the right heel.  Patient believes the wound is about the same to the distal tip of the toe.  He continues to have some slight sensitivity and tenderness to the posterior heel as well.  Past Medical History:  Diagnosis Date   Arthritis    "knees" (01/01/2016)   Cataract    OS   CHF (congestive heart failure) (HCC)    Chronic kidney disease    Diabetes mellitus (HCC)    Diabetic retinopathy ([place])    NPDR OU   Difficult intubation    Erectile dysfunction    GERD (gastroesophageal reflux disease)    Glaucoma    OS   HTN (hypertension)    Hyperlipidemia    Neuromuscular disorder (HCC)    neuropathy   Obesity    OSA on CPAP    Pain of left upper arm    Restrictive lung disease    Sensory hearing loss, bilateral    Sleep apnea    SOBOE (shortness of breath on exertion) 11/03/2015   currently goes to gym   Type II diabetes mellitus [facility])     Past Surgical History:  Procedure Laterality Date   CATARACT EXTRACTION Right 03/23/2020   Dr. Marylynn Pearson   COLONOSCOPY     EYE SURGERY Right 03/23/2020   Cat Sx - Dr. Marylynn Pearson   GLAUCOMA SURGERY Bilateral    "laser"   JOINT REPLACEMENT Left    knee   LAPAROSCOPIC CHOLECYSTECTOMY  09/17/2018   TOTAL KNEE ARTHROPLASTY Left 11/09/2015   Procedure: LEFT TOTAL KNEE ARTHROPLASTY;  Surgeon: Gaynelle Arabian, MD;  Location: [facility];  Service: Orthopedics;  Laterality: Left;    Allergies  Allergen Reactions   Nsaids Other (See Comments)    GI BLEED   Aspirin Other (See Comments)    STOMACH ULCER  STOMACH ULCER  STOMACH ULCER  STOMACH ULCER  STOMACH ULCER  STOMACH ULCER  STOMACH ULCER    Other Other  (See Comments)    blisters        Objective/Physical Exam General: The patient is alert and oriented x3 in no acute distress.  Dermatology:  Overall there is some slight improvement of the toe.  Wound #1 noted to the distal tip of the right great toe measuring approximately 0.9x0.9 x 0.1 cm (LxWxD).   To the noted ulceration(s), there is no eschar. There is a moderate amount of slough, fibrin, and necrotic tissue noted. Granulation tissue and wound base is red. There is a minimal amount of serosanguineous drainage noted. There is no exposed bone muscle-tendon ligament or joint. There is no malodor. Periwound integrity is intact. Skin is warm, dry and supple bilateral lower extremities.  Preulcerative callus lesion noted to the lateral aspect of the right heel appears stable..  There is no open wound.  Vascular: Palpable pedal pulses bilaterally. No edema or erythema noted. Capillary refill within normal limits.  Neurological: Epicritic and protective threshold diminished bilaterally.   Musculoskeletal Exam: No prior amputations.  No pedal deformity.  Assessment: 1.  Ulcer distal tip of the right hallux secondary to diabetes mellitus 2. diabetes mellitus w/ peripheral neuropathy  3 symptomatic preulcerative callus lesion right heel; stable  Plan of Care:  1. Patient was evaluated. 2. medically necessary excisional debridement including subcutaneous tissue was performed using a tissue nipper and a chisel blade. Excisional debridement of all the necrotic nonviable tissue down to healthy bleeding viable tissue was performed with post-debridement measurements same as pre-. 3. the wound was cleansed and dry sterile dressing applied. 4.  Continue silver alginate dressings that were provided for the patient to apply to the wound daily with a light Band-Aid 5.  Continue open toe compression hose 6.  Return to clinic 3 weeks   Edrick Kins, DPM [facility]  Dr. Edrick Kins, DPM    [address], [place] [address]                Office [phone]  Fax [phone]

## 2022-06-07 ENCOUNTER — Ambulatory Visit: Payer: Medicare PPO | Admitting: Podiatry

## 2022-06-07 ENCOUNTER — Encounter: Payer: Self-pay | Admitting: Podiatry

## 2022-06-07 DIAGNOSIS — E0843 Diabetes mellitus due to underlying condition with diabetic autonomic (poly)neuropathy: Secondary | ICD-10-CM | POA: Diagnosis not present

## 2022-06-07 DIAGNOSIS — M79674 Pain in right toe(s): Secondary | ICD-10-CM

## 2022-06-07 DIAGNOSIS — N183 Chronic kidney disease, stage 3 unspecified: Secondary | ICD-10-CM | POA: Diagnosis not present

## 2022-06-07 DIAGNOSIS — B351 Tinea unguium: Secondary | ICD-10-CM | POA: Diagnosis not present

## 2022-06-07 DIAGNOSIS — M79675 Pain in left toe(s): Secondary | ICD-10-CM | POA: Diagnosis not present

## 2022-06-07 NOTE — Progress Notes (Signed)
Patient ID: Micheal Morris, male   DOB: [birth year], 76 y.o.   MRN: [id number] Complaint:  Visit Type: Patient returns to my office for continued preventative foot care services. Complaint: Patient states" my nails have grown long and thick and become painful to walk and wear shoes" Patient has been diagnosed with DM with  neuropathy.. The patient presents for preventative foot care services. No changes to ROS.   Podiatric Exam: Vascular: dorsalis pedis and posterior tibial pulses are weakly  palpable bilateral. Capillary return is immediate. Temperature gradient is WNL. Skin turgor WNL  Sensorium: Normal Semmes Weinstein monofilament test. Normal tactile sensation bilaterally. Nail Exam: Pt has thick disfigured discolored nails with subungual debris noted bilateral entire nail hallux through fifth toenails Ulcer Exam: There is no evidence of ulcer or pre-ulcerative changes or infection. Orthopedic Exam: Muscle tone and strength are WNL. No limitations in general ROM. No crepitus or effusions noted. Foot type and digits show no abnormalities. Bony prominences are unremarkable. Skin: No Porokeratosis. No infection or ulcers.  Dry scaly skin. Ulcer right hallux. Diagnosis:  Onychomycosis, , Pain in right toe, pain in left toes  Treatment & Plan Procedures and Treatment: Consent by patient was obtained for treatment procedures. The patient understood the discussion of treatment and procedures well. All questions were answered thoroughly reviewed. Debridement of mycotic and hypertrophic toenails, 1 through 5 bilateral and clearing of subungual debris. No ulceration, no infection noted.  Return Visit-Office Procedure: Patient instructed to return to the office for a follow up visit 9   weeks  for continued evaluation and treatment.  Gardiner Barefoot DPM

## 2022-06-15 ENCOUNTER — Ambulatory Visit: Payer: Medicare PPO | Admitting: Podiatry

## 2022-06-15 DIAGNOSIS — L97512 Non-pressure chronic ulcer of other part of right foot with fat layer exposed: Secondary | ICD-10-CM | POA: Diagnosis not present

## 2022-06-15 DIAGNOSIS — E0843 Diabetes mellitus due to underlying condition with diabetic autonomic (poly)neuropathy: Secondary | ICD-10-CM | POA: Diagnosis not present

## 2022-06-15 NOTE — Progress Notes (Signed)
Chief Complaint  Patient presents with   Foot Ulcer    Right hallux ulcer, patient states that the ulcer is better than the last, patient denies any pain, patient is having some drainage.    Subjective:  76 y.o. male with PMHx of diabetes mellitus presenting for follow-up evaluation of an ulcer to the distal tip of the right great toe as well as heel pain to the lateral aspect of the right heel.  Overall improvement.  Patient has been applying the silver alginate dressings with a light Band-Aid.  Past Medical History:  Diagnosis Date   Arthritis    "knees" (01/01/2016)   Cataract    OS   CHF (congestive heart failure) (HCC)    Chronic kidney disease    Diabetes mellitus (HCC)    Diabetic retinopathy ([place])    NPDR OU   Difficult intubation    Erectile dysfunction    GERD (gastroesophageal reflux disease)    Glaucoma    OS   HTN (hypertension)    Hyperlipidemia    Neuromuscular disorder (HCC)    neuropathy   Obesity    OSA on CPAP    Pain of left upper arm    Restrictive lung disease    Sensory hearing loss, bilateral    Sleep apnea    SOBOE (shortness of breath on exertion) 11/03/2015   currently goes to gym   Type II diabetes mellitus [facility])     Past Surgical History:  Procedure Laterality Date   CATARACT EXTRACTION Right 03/23/2020   Dr. Marylynn Pearson   COLONOSCOPY     EYE SURGERY Right 03/23/2020   Cat Sx - Dr. Marylynn Pearson   GLAUCOMA SURGERY Bilateral    "laser"   JOINT REPLACEMENT Left    knee   LAPAROSCOPIC CHOLECYSTECTOMY  09/17/2018   TOTAL KNEE ARTHROPLASTY Left 11/09/2015   Procedure: LEFT TOTAL KNEE ARTHROPLASTY;  Surgeon: Gaynelle Arabian, MD;  Location: [facility];  Service: Orthopedics;  Laterality: Left;    Allergies  Allergen Reactions   Nsaids Other (See Comments)    GI BLEED   Aspirin Other (See Comments)    STOMACH ULCER  STOMACH ULCER  STOMACH ULCER  STOMACH ULCER  STOMACH ULCER  STOMACH ULCER  STOMACH ULCER    Other Other (See Comments)     blisters     05/25/2022   Objective/Physical Exam General: The patient is alert and oriented x3 in no acute distress.  Dermatology:  Wound stable.  Overall there continues to be slight improvement of the toe.  Wound #1 noted to the distal tip of the right great toe measuring approximately 0.8 time 0.8 x 0.1 cm (LxWxD).   To the noted ulceration(s), there is no eschar. There is a moderate amount of slough, fibrin, and necrotic tissue noted. Granulation tissue and wound base is red. There is a minimal amount of serosanguineous drainage noted. There is no exposed bone muscle-tendon ligament or joint. There is no malodor. Periwound integrity is intact. Skin is warm, dry and supple bilateral lower extremities.  Preulcerative callus lesion noted to the lateral aspect of the right heel appears stable.  No appreciable change since last visit  Vascular: Palpable pedal pulses bilaterally. No edema or erythema noted. Capillary refill within normal limits.  Neurological: Epicritic and protective threshold diminished bilaterally.   Musculoskeletal Exam: No prior amputations.  No pedal deformity.  Assessment: 1.  Ulcer distal tip of the right hallux secondary to diabetes mellitus 2. diabetes mellitus w/ peripheral neuropathy  3 symptomatic preulcerative callus lesion right heel; stable  Plan of Care:  1. Patient was evaluated. 2. medically necessary excisional debridement including subcutaneous tissue was performed using a tissue nipper and a chisel blade. Excisional debridement of all the necrotic nonviable tissue down to healthy bleeding viable tissue was performed with post-debridement measurements same as pre-. 3. the wound was cleansed and dry sterile dressing applied. 4.  Continue silver alginate dressings that were provided for the patient to apply to the wound daily with a light Band-Aid 5.  Continue open toe compression hose 6.  Return to clinic 3 weeks   Edrick Kins, DPM [facility]  Dr. Edrick Kins, DPM    [address], [place] [address]                Office [phone]  Fax [phone]

## 2022-06-30 ENCOUNTER — Other Ambulatory Visit: Payer: Self-pay | Admitting: Internal Medicine

## 2022-06-30 DIAGNOSIS — E278 Other specified disorders of adrenal gland: Secondary | ICD-10-CM

## 2022-07-13 ENCOUNTER — Ambulatory Visit: Payer: Medicare PPO | Admitting: Podiatry

## 2022-07-13 ENCOUNTER — Encounter: Payer: Self-pay | Admitting: Podiatry

## 2022-07-13 VITALS — BP 131/79 | HR 89

## 2022-07-13 DIAGNOSIS — L97512 Non-pressure chronic ulcer of other part of right foot with fat layer exposed: Secondary | ICD-10-CM

## 2022-07-13 DIAGNOSIS — E0843 Diabetes mellitus due to underlying condition with diabetic autonomic (poly)neuropathy: Secondary | ICD-10-CM | POA: Diagnosis not present

## 2022-07-13 NOTE — Progress Notes (Signed)
Chief Complaint  Patient presents with   wound care    Patient is here for right foot great toe ulcer, he states that the ulcer is healing well.    Subjective:  76 y.o. male with PMHx of diabetes mellitus presenting for follow-up evaluation of an ulcer to the distal tip of the right great toe as well as heel pain to the lateral aspect of the right heel.  Overall improvement.  Patient has been applying the silver alginate dressings with a light Band-Aid.  Past Medical History:  Diagnosis Date   Arthritis    "knees" (01/01/2016)   Cataract    OS   CHF (congestive heart failure) (HCC)    Chronic kidney disease    Diabetes mellitus (HCC)    Diabetic retinopathy ([place])    NPDR OU   Difficult intubation    Erectile dysfunction    GERD (gastroesophageal reflux disease)    Glaucoma    OS   HTN (hypertension)    Hyperlipidemia    Neuromuscular disorder (HCC)    neuropathy   Obesity    OSA on CPAP    Pain of left upper arm    Restrictive lung disease    Sensory hearing loss, bilateral    Sleep apnea    SOBOE (shortness of breath on exertion) 11/03/2015   currently goes to gym   Type II diabetes mellitus [facility])     Past Surgical History:  Procedure Laterality Date   CATARACT EXTRACTION Right 03/23/2020   Dr. Marylynn Pearson   COLONOSCOPY     EYE SURGERY Right 03/23/2020   Cat Sx - Dr. Marylynn Pearson   GLAUCOMA SURGERY Bilateral    "laser"   JOINT REPLACEMENT Left    knee   LAPAROSCOPIC CHOLECYSTECTOMY  09/17/2018   TOTAL KNEE ARTHROPLASTY Left 11/09/2015   Procedure: LEFT TOTAL KNEE ARTHROPLASTY;  Surgeon: Gaynelle Arabian, MD;  Location: [facility];  Service: Orthopedics;  Laterality: Left;    Allergies  Allergen Reactions   Nsaids Other (See Comments)    GI BLEED   Aspirin Other (See Comments)    STOMACH ULCER  STOMACH ULCER  STOMACH ULCER  STOMACH ULCER  STOMACH ULCER  STOMACH ULCER  STOMACH ULCER    Other Other (See Comments)    blisters      05/25/2022   Objective/Physical Exam General: The patient is alert and oriented x3 in no acute distress.  Dermatology:  Wound stable.  Overall there continues to be slight improvement of the toe.  Wound #1 noted to the distal tip of the right great toe measuring approximately 0.5 x 0.5 x 0.1 cm (LxWxD).   To the noted ulceration(s), there is no eschar. There is a moderate amount of slough, fibrin, and necrotic tissue noted. Granulation tissue and wound base is red. There is a minimal amount of serosanguineous drainage noted. There is no exposed bone muscle-tendon ligament or joint. There is no malodor. Periwound integrity is intact. Skin is warm, dry and supple bilateral lower extremities.  Preulcerative callus lesion noted to the lateral aspect of the right heel appears stable.  No appreciable change since last visit  Vascular: Palpable pedal pulses bilaterally. No edema or erythema noted. Capillary refill within normal limits.  Neurological: Epicritic and protective threshold diminished bilaterally.   Musculoskeletal Exam: No prior amputations.  No pedal deformity.  Assessment: 1.  Ulcer distal tip of the right hallux secondary to diabetes mellitus 2. diabetes mellitus w/ peripheral neuropathy 3 symptomatic preulcerative callus lesion  right heel; stable  Plan of Care:  1. Patient was evaluated. 2. medically necessary excisional debridement including subcutaneous tissue was performed using a tissue nipper and a chisel blade. Excisional debridement of all the necrotic nonviable tissue down to healthy bleeding viable tissue was performed with post-debridement measurements same as pre-. 3. the wound was cleansed and dry sterile dressing applied. 4.  Continue silver alginate dressings that were provided for the patient to apply to the wound daily with a light Band-Aid 5.  Continue open toe compression hose 6.  Return to clinic 3 weeks   Edrick Kins, DPM [facility]  Dr. Edrick Kins, DPM    [address],  [address]                Office [phone]  Fax [phone]

## 2022-08-03 ENCOUNTER — Ambulatory Visit: Payer: Medicare PPO | Admitting: Podiatry

## 2022-08-03 DIAGNOSIS — L97512 Non-pressure chronic ulcer of other part of right foot with fat layer exposed: Secondary | ICD-10-CM

## 2022-08-03 DIAGNOSIS — E0843 Diabetes mellitus due to underlying condition with diabetic autonomic (poly)neuropathy: Secondary | ICD-10-CM | POA: Diagnosis not present

## 2022-08-03 MED ORDER — CLOTRIMAZOLE-BETAMETHASONE 1-0.05 % EX CREA: 1.0000 | TOPICAL_CREAM | Freq: Two times a day (BID) | CUTANEOUS | 1 refills | Status: DC

## 2022-08-03 NOTE — Progress Notes (Signed)
Chief Complaint  Patient presents with   Foot Problem    R great toe ulcer    Subjective:  77 y.o. male with PMHx of diabetes mellitus presenting for follow-up evaluation of an ulcer to the distal tip of the right great toe as well as heel pain to the lateral aspect of the right heel.  Overall improvement.  Patient has been applying the silver alginate dressings with a light Band-Aid.  Past Medical History:  Diagnosis Date   Arthritis    "knees" (01/01/2016)   Cataract    OS   CHF (congestive heart failure) (HCC)    Chronic kidney disease    Diabetes mellitus (HCC)    Diabetic retinopathy ([place])    NPDR OU   Difficult intubation    Erectile dysfunction    GERD (gastroesophageal reflux disease)    Glaucoma    OS   HTN (hypertension)    Hyperlipidemia    Neuromuscular disorder (HCC)    neuropathy   Obesity    OSA on CPAP    Pain of left upper arm    Restrictive lung disease    Sensory hearing loss, bilateral    Sleep apnea    SOBOE (shortness of breath on exertion) 11/03/2015   currently goes to gym   Type II diabetes mellitus [facility])     Past Surgical History:  Procedure Laterality Date   CATARACT EXTRACTION Right 03/23/2020   Dr. Marylynn Pearson   COLONOSCOPY     EYE SURGERY Right 03/23/2020   Cat Sx - Dr. Marylynn Pearson   GLAUCOMA SURGERY Bilateral    "laser"   JOINT REPLACEMENT Left    knee   LAPAROSCOPIC CHOLECYSTECTOMY  09/17/2018   TOTAL KNEE ARTHROPLASTY Left 11/09/2015   Procedure: LEFT TOTAL KNEE ARTHROPLASTY;  Surgeon: Gaynelle Arabian, MD;  [facility];  Service: Orthopedics;  Laterality: Left;    Allergies  Allergen Reactions   Nsaids Other (See Comments)    GI BLEED   Aspirin Other (See Comments)    STOMACH ULCER  STOMACH ULCER  STOMACH ULCER  STOMACH ULCER  STOMACH ULCER  STOMACH ULCER  STOMACH ULCER    Other Other (See Comments)    blisters     05/25/2022   RT great toe 08/03/2022  Objective/Physical Exam General: The patient is  alert and oriented x3 in no acute distress.  Dermatology:  Wound stable.  Overall there continues to be slight improvement of the toe.  Wound #1 noted to the distal tip of the right great toe measuring approximately 0.5 x 0.5 x 0.1 cm (LxWxD).   To the noted ulceration(s), there is no eschar. There is a moderate amount of slough, fibrin, and necrotic tissue noted. Granulation tissue and wound base is red. There is a minimal amount of serosanguineous drainage noted. There is no exposed bone muscle-tendon ligament or joint. There is no malodor. Periwound integrity is intact. Skin is warm, dry and supple bilateral lower extremities.  Preulcerative callus lesion noted to the lateral aspect of the right heel appears stable.  No appreciable change since last visit  There is also some hyperpigmented lesion to the dorsum of the right foot with flaking skin.  Concerning for possible fungal infection versus psoriatic lesion  Vascular: Palpable pedal pulses bilaterally. No edema or erythema noted. Capillary refill within normal limits.  Neurological: Epicritic and protective threshold diminished bilaterally.   Musculoskeletal Exam: No prior amputations.  No pedal deformity.  Assessment: 1.  Ulcer distal tip of  the right hallux secondary to diabetes mellitus 2. diabetes mellitus w/ peripheral neuropathy 3 symptomatic preulcerative callus lesion right heel; stable 4.  Hyperpigmented lesion dorsal aspect of the right foot  Plan of Care:  1. Patient was evaluated. 2. medically necessary excisional debridement including subcutaneous tissue was performed using a tissue nipper and a chisel blade. Excisional debridement of all the necrotic nonviable tissue down to healthy bleeding viable tissue was performed with post-debridement measurements same as pre-. 3. the wound was cleansed and dry sterile dressing applied. 4.  Continue silver alginate dressings that were provided for the patient to apply to the  wound daily with a light Band-Aid 5.  Continue open toe compression hose 6.  Prescription for Lotrisone cream to apply to the dorsum of the skin  7.  Return to clinic 4 weeks   Edrick Kins, DPM [facility]  Dr. Edrick Kins, DPM    [address], [place] [address]                Office [phone]  Fax [phone]

## 2022-08-19 ENCOUNTER — Encounter: Payer: Self-pay | Admitting: Podiatry

## 2022-08-19 ENCOUNTER — Ambulatory Visit: Payer: Medicare PPO | Admitting: Podiatry

## 2022-08-19 DIAGNOSIS — E0843 Diabetes mellitus due to underlying condition with diabetic autonomic (poly)neuropathy: Secondary | ICD-10-CM

## 2022-08-19 DIAGNOSIS — L97512 Non-pressure chronic ulcer of other part of right foot with fat layer exposed: Secondary | ICD-10-CM

## 2022-08-19 DIAGNOSIS — N183 Chronic kidney disease, stage 3 unspecified: Secondary | ICD-10-CM

## 2022-08-19 DIAGNOSIS — M79674 Pain in right toe(s): Secondary | ICD-10-CM

## 2022-08-19 DIAGNOSIS — B351 Tinea unguium: Secondary | ICD-10-CM

## 2022-08-19 DIAGNOSIS — M79675 Pain in left toe(s): Secondary | ICD-10-CM

## 2022-08-19 NOTE — Progress Notes (Signed)
Patient ID: Micheal Morris, male   DOB: 1945-11-23, 77 y.o.   MRN: [id number] Complaint:  Visit Type: Patient returns to my office for continued preventative foot care services. Complaint: Patient states" my nails have grown long and thick and become painful to walk and wear shoes" Patient has been diagnosed with DM with  neuropathy.. The patient presents for preventative foot care services.  He is under care by Dr.  Amalia Hailey for ulcer right big toe.  He presents to the office with no bandage.  He presents to the office with his wife.     Podiatric Exam: Vascular: dorsalis pedis and posterior tibial pulses are weakly  palpable bilateral. Capillary return is immediate. Temperature gradient is WNL. Skin turgor WNL  Sensorium: Normal Semmes Weinstein monofilament test. Normal tactile sensation bilaterally. Nail Exam: Pt has thick disfigured discolored nails with subungual debris noted bilateral entire  hallux through fifth toenails Ulcer Exam: There is evidence of peeling and healing of ulcer on the tip of his right hallux. Orthopedic Exam: Muscle tone and strength are WNL. No limitations in general ROM. No crepitus or effusions noted. Foot type and digits show no abnormalities. Bony prominences are unremarkable. Skin: No Porokeratosis. No infection or ulcers.  Dry scaly skin. Ulcer right hallux.   Diagnosis:  Onychomycosis, , Pain in right toe, pain in left toes  Ulcer right hallux. Treatment & Plan Procedures and Treatment: Consent by patient was obtained for treatment procedures. The patient understood the discussion of treatment and procedures well. All questions were answered thoroughly reviewed. Debridement of mycotic and hypertrophic toenails, 1 through 5 bilateral and clearing of subungual debris. Neosporin/DSD right hallux.  Return Visit-Office Procedure: Patient instructed to return to the office for a follow up visit 10  weeks  for continued evaluation and treatment.  Gardiner Barefoot DPM

## 2022-08-31 ENCOUNTER — Ambulatory Visit
Admission: RE | Admit: 2022-08-31 | Discharge: 2022-08-31 | Disposition: A | Discharge status: Home / Self Care | Payer: Medicare PPO | Source: Ambulatory Visit | Attending: Internal Medicine | Admitting: Internal Medicine

## 2022-08-31 DIAGNOSIS — E278 Other specified disorders of adrenal gland: Secondary | ICD-10-CM

## 2022-09-05 ENCOUNTER — Ambulatory Visit: Payer: Medicare PPO | Admitting: Podiatry

## 2022-09-05 VITALS — BP 143/76 | HR 90

## 2022-09-05 DIAGNOSIS — L819 Disorder of pigmentation, unspecified: Secondary | ICD-10-CM | POA: Diagnosis not present

## 2022-09-05 DIAGNOSIS — L97512 Non-pressure chronic ulcer of other part of right foot with fat layer exposed: Secondary | ICD-10-CM | POA: Diagnosis not present

## 2022-09-05 NOTE — Progress Notes (Signed)
Chief Complaint  Patient presents with   Foot Ulcer    Diabetic right foot ulcer, A1c- BG-     Subjective:  77 y.o. male with PMHx of diabetes mellitus presenting for follow-up evaluation of an ulcer to the distal tip of the right great toe as well as heel pain to the lateral aspect of the right heel.  Patient believes that the ulcer has healed to the right great toe  Patient also presents for follow-up treatment and evaluation of hyperpigmentation to the dorsum of the right foot.  Last visit Lotrisone cream was applied.  He has been applying 2 times daily with significant improvement.  He presents for further treatment and evaluation  Past Medical History:  Diagnosis Date   Arthritis    "knees" (01/01/2016)   Cataract    OS   CHF (congestive heart failure) (HCC)    Chronic kidney disease    Diabetes mellitus ([place])    Diabetic retinopathy ([place])    NPDR OU   Difficult intubation    Erectile dysfunction    GERD (gastroesophageal reflux disease)    Glaucoma    OS   HTN (hypertension)    Hyperlipidemia    Neuromuscular disorder (HCC)    neuropathy   Obesity    OSA on CPAP    Pain of left upper arm    Restrictive lung disease    Sensory hearing loss, bilateral    Sleep apnea    SOBOE (shortness of breath on exertion) 11/03/2015   currently goes to gym   Type II diabetes mellitus [facility])     Past Surgical History:  Procedure Laterality Date   CATARACT EXTRACTION Right 03/23/2020   Dr. Marylynn Pearson   COLONOSCOPY     EYE SURGERY Right 03/23/2020   Cat Sx - Dr. Marylynn Pearson   GLAUCOMA SURGERY Bilateral    "laser"   JOINT REPLACEMENT Left    knee   LAPAROSCOPIC CHOLECYSTECTOMY  09/17/2018   TOTAL KNEE ARTHROPLASTY Left 11/09/2015   Procedure: LEFT TOTAL KNEE ARTHROPLASTY;  Surgeon: Gaynelle Arabian, MD;  [facility];  Service: Orthopedics;  Laterality: Left;    Allergies  Allergen Reactions   Nsaids Other (See Comments)    GI BLEED   Aspirin Other (See Comments)     STOMACH ULCER  STOMACH ULCER  STOMACH ULCER  STOMACH ULCER  STOMACH ULCER  STOMACH ULCER  STOMACH ULCER    Other Other (See Comments)    blisters     05/25/2022   RT great toe 08/03/2022   RT foot 09/05/2022  Objective/Physical Exam General: The patient is alert and oriented x3 in no acute distress.  Dermatology:  The ulcer to the right great toe appears mostly resolved.  There is some superficial skin breakdown to the lateral aspect of the right hallux adjacent to the nail plate.  This appears very superficial and stable.  Also there is superficial breakdown of the wound to the posterior heel.  Healthy granular wound base with good potential for healing  Persistent hyperpigmentation noted along the dorsum of the right great toe as well as the dorsum of the foot.  Vascular: Palpable pedal pulses bilaterally. No edema or erythema noted. Capillary refill within normal limits.  Neurological: Epicritic and protective threshold diminished bilaterally.   Musculoskeletal Exam: No prior amputations.  No pedal deformity.  Assessment: 1.  Ulcer distal tip of the right hallux secondary to diabetes mellitus 2. diabetes mellitus w/ peripheral neuropathy 3 Hyperpigmented lesion dorsal aspect  of the right foot  Plan of Care:  1. Patient was evaluated. 2. medically necessary excisional debridement including subcutaneous tissue was performed using a tissue nipper and a chisel blade. Excisional debridement of all the necrotic nonviable tissue down to healthy bleeding viable tissue was performed with post-debridement measurements same as pre-. 3. the wound was cleansed and dry sterile dressing applied. 4.  Continue silver alginate dressings that were provided for the patient to apply to the wound daily with a light Band-Aid 5.  Continue open toe compression hose 6.  Continue Lotrisone cream to apply to the dorsum of the skin  7.  Return to clinic 6 weeks   Edrick Kins, DPM [facility]  Dr. Edrick Kins, DPM    [address], [place] [address]                Office [phone]  Fax [phone]

## 2022-10-17 ENCOUNTER — Ambulatory Visit: Payer: Medicare PPO | Admitting: Podiatry

## 2022-10-27 ENCOUNTER — Ambulatory Visit (INDEPENDENT_AMBULATORY_CARE_PROVIDER_SITE_OTHER): Payer: Medicare PPO

## 2022-10-27 ENCOUNTER — Ambulatory Visit: Payer: Medicare PPO | Admitting: Podiatry

## 2022-10-27 DIAGNOSIS — L97512 Non-pressure chronic ulcer of other part of right foot with fat layer exposed: Secondary | ICD-10-CM

## 2022-10-27 MED ORDER — DOXYCYCLINE HYCLATE 100 MG PO TABS: 100.0000 mg | ORAL_TABLET | Freq: Two times a day (BID) | ORAL | 0 refills | Status: DC

## 2022-10-27 MED ORDER — SILVER SULFADIAZINE 1 % EX CREA: 1.0000 | TOPICAL_CREAM | Freq: Every day | CUTANEOUS | 0 refills | Status: DC

## 2022-10-27 NOTE — Progress Notes (Signed)
  Subjective:  Patient ID: Micheal Morris, male    DOB: 27-Jan-1946,  MRN: [id number]  Chief Complaint  Patient presents with   Foot Ulcer    Great toe infected Right. 'smelling and leaking', Diabetic    76 y.o. male presents with the above complaint. History confirmed with patient.  He returns for follow-up and ongoing wound care of the right great toe, noticed a significant change over the last couple days and quite a bit of odor from the toe.  There is also a preulcerative callus on the side of the heel, they will be dressed with Silvadene recently.  Objective:  Physical Exam: warm, good capillary refill and weakly palpable pedal pulses, he has lymphedema bilaterally, there is full-thickness ulceration on the right second toe measuring approximately 1.5 x 2.0 that probes deep with a central sinus tract to the distal tuft of the phalanx, there is necrotic foul-smelling drainage and tissue.  Preulcerative callus right lateral heel   Radiographs: Multiple views x-ray of the right foot: Possible erosion of distal medial tuft of the phalanx, no gas Assessment:   1. Ulcer of great toe, right, with fat layer exposed ([place])      Plan:  Patient was evaluated and treated and all questions answered.   Ulcer right hallux -We discussed the etiology and factors that are a part of the wound healing process.  We also discussed the risk of infection both soft tissue and osteomyelitis from open ulceration.  Discussed the risk of limb loss if this happens or worsens. -Debridement as below. -Dressed with Iodosorb, DSD. -Continue home dressing changes daily with 4 x 4 gauze and Silvadene, Rx sent to pharmacy for refill -Vascular testing normal ABIs last fall -HgbA1c: 8.2%, we discussed the impact of this on infection risk and wound healing -Last antibiotics: Rx for doxycycline sent to pharmacy with the amount of local infection developing -Imaging: x-ray reviewed, shows possible erosion or infection.  Will order MRI to further evaluate.  Procedure: Excisional Debridement of Wound Rationale: Removal of non-viable soft tissue from the wound to promote healing.  Anesthesia: none Post-Debridement Wound Measurements: Noted above Type of Debridement: Sharp Excisional Tissue Removed: Non-viable soft tissue Depth of Debridement: subcutaneous tissue. Technique: Sharp excisional debridement to bleeding, viable wound base.  Dressing: Dry, sterile, compression dressing. Disposition: Patient tolerated procedure well.       Return in about 2 weeks (around 11/10/2022) for wound care, after MRI to review.

## 2022-10-30 LAB — WOUND CULTURE
MICRO NUMBER:: 14755166
SPECIMEN QUALITY:: ADEQUATE

## 2022-10-31 ENCOUNTER — Ambulatory Visit: Payer: Medicare PPO | Admitting: Podiatry

## 2022-10-31 ENCOUNTER — Encounter: Payer: Self-pay | Admitting: Podiatry

## 2022-10-31 VITALS — BP 141/72 | HR 93

## 2022-10-31 DIAGNOSIS — M79675 Pain in left toe(s): Secondary | ICD-10-CM

## 2022-10-31 DIAGNOSIS — B351 Tinea unguium: Secondary | ICD-10-CM

## 2022-10-31 DIAGNOSIS — E0843 Diabetes mellitus due to underlying condition with diabetic autonomic (poly)neuropathy: Secondary | ICD-10-CM

## 2022-10-31 DIAGNOSIS — L97501 Non-pressure chronic ulcer of other part of unspecified foot limited to breakdown of skin: Secondary | ICD-10-CM | POA: Diagnosis not present

## 2022-10-31 DIAGNOSIS — M79674 Pain in right toe(s): Secondary | ICD-10-CM | POA: Diagnosis not present

## 2022-10-31 DIAGNOSIS — L97511 Non-pressure chronic ulcer of other part of right foot limited to breakdown of skin: Secondary | ICD-10-CM

## 2022-10-31 NOTE — Progress Notes (Signed)
Patient ID: Micheal Morris, male   DOB: 11/11/1945, 77 y.o.   MRN: [id number] Complaint:  Visit Type: Patient returns to my office for continued preventative foot care services. Complaint: Patient states" my nails have grown long and thick and become painful to walk and wear shoes" Patient has been diagnosed with DM with  neuropathy.. The patient presents for preventative foot care services.  He is under care by Dr.  Amalia Hailey / [place]for ulcer right big toe.  He presents to the office with no bandage.  He presents to the office with his wife.     Podiatric Exam: Vascular: dorsalis pedis and posterior tibial pulses are weakly  palpable bilateral. Capillary return is immediate. Temperature gradient is WNL. Skin turgor WNL  Sensorium: Normal Semmes Weinstein monofilament test. Normal tactile sensation bilaterally. Nail Exam: Pt has thick disfigured discolored nails with subungual debris noted bilateral entire  hallux through fifth toenails Ulcer Exam: There is evidence of peeling and healing of ulcer on the tip of his right hallux. Orthopedic Exam: Muscle tone and strength are WNL. No limitations in general ROM. No crepitus or effusions noted. Foot type and digits show no abnormalities. Bony prominences are unremarkable. Skin: No Porokeratosis. No infection or ulcers.  Dry scaly skin. Ulcer right hallux.   Diagnosis:  Onychomycosis, , Pain in right toe, pain in left toes  Ulcer right hallux. Treatment & Plan Procedures and Treatment: Consent by patient was obtained for treatment procedures. The patient understood the discussion of treatment and procedures well. All questions were answered thoroughly reviewed. Debridement of mycotic and hypertrophic toenails, 1 through 5 bilateral and clearing of subungual debris. Neosporin/DSD right hallux.  Return Visit-Office Procedure: Patient instructed to return to the office for a follow up visit 10  weeks  for continued evaluation and treatment.  Gardiner Barefoot  DPM

## 2022-11-10 ENCOUNTER — Other Ambulatory Visit: Payer: Self-pay

## 2022-11-10 ENCOUNTER — Encounter (HOSPITAL_COMMUNITY): Payer: Self-pay | Admitting: Internal Medicine

## 2022-11-10 ENCOUNTER — Ambulatory Visit: Payer: Medicare PPO | Admitting: Podiatry

## 2022-11-10 ENCOUNTER — Inpatient Hospital Stay (HOSPITAL_COMMUNITY)
Admission: AD | Admit: 2022-11-10 | Discharge: 2022-11-13 | DRG: 617 | Disposition: A | Discharge status: Home / Self Care | Payer: Medicare PPO | Source: Ambulatory Visit | Attending: Internal Medicine | Admitting: Internal Medicine

## 2022-11-10 ENCOUNTER — Encounter (HOSPITAL_COMMUNITY): Payer: Self-pay

## 2022-11-10 ENCOUNTER — Inpatient Hospital Stay (HOSPITAL_COMMUNITY): Payer: Medicare PPO

## 2022-11-10 VITALS — BP 130/67 | HR 83 | Temp 99.1°F

## 2022-11-10 DIAGNOSIS — I110 Hypertensive heart disease with heart failure: Secondary | ICD-10-CM | POA: Diagnosis not present

## 2022-11-10 DIAGNOSIS — Z833 Family history of diabetes mellitus: Secondary | ICD-10-CM

## 2022-11-10 DIAGNOSIS — E785 Hyperlipidemia, unspecified: Secondary | ICD-10-CM | POA: Diagnosis present

## 2022-11-10 DIAGNOSIS — Z9989 Dependence on other enabling machines and devices: Secondary | ICD-10-CM | POA: Diagnosis not present

## 2022-11-10 DIAGNOSIS — E113293 Type 2 diabetes mellitus with mild nonproliferative diabetic retinopathy without macular edema, bilateral: Secondary | ICD-10-CM | POA: Diagnosis present

## 2022-11-10 DIAGNOSIS — M86171 Other acute osteomyelitis, right ankle and foot: Secondary | ICD-10-CM | POA: Diagnosis present

## 2022-11-10 DIAGNOSIS — L97512 Non-pressure chronic ulcer of other part of right foot with fat layer exposed: Secondary | ICD-10-CM

## 2022-11-10 DIAGNOSIS — E669 Obesity, unspecified: Secondary | ICD-10-CM | POA: Diagnosis present

## 2022-11-10 DIAGNOSIS — Z96652 Presence of left artificial knee joint: Secondary | ICD-10-CM | POA: Diagnosis present

## 2022-11-10 DIAGNOSIS — E1169 Type 2 diabetes mellitus with other specified complication: Secondary | ICD-10-CM | POA: Diagnosis present

## 2022-11-10 DIAGNOSIS — L97509 Non-pressure chronic ulcer of other part of unspecified foot with unspecified severity: Secondary | ICD-10-CM | POA: Diagnosis not present

## 2022-11-10 DIAGNOSIS — B951 Streptococcus, group B, as the cause of diseases classified elsewhere: Secondary | ICD-10-CM | POA: Diagnosis present

## 2022-11-10 DIAGNOSIS — E1149 Type 2 diabetes mellitus with other diabetic neurological complication: Secondary | ICD-10-CM | POA: Diagnosis not present

## 2022-11-10 DIAGNOSIS — L97519 Non-pressure chronic ulcer of other part of right foot with unspecified severity: Secondary | ICD-10-CM | POA: Diagnosis present

## 2022-11-10 DIAGNOSIS — Z7985 Long-term (current) use of injectable non-insulin antidiabetic drugs: Secondary | ICD-10-CM | POA: Diagnosis not present

## 2022-11-10 DIAGNOSIS — K219 Gastro-esophageal reflux disease without esophagitis: Secondary | ICD-10-CM | POA: Diagnosis present

## 2022-11-10 DIAGNOSIS — Z6841 Body Mass Index (BMI) 40.0 and over, adult: Secondary | ICD-10-CM | POA: Diagnosis not present

## 2022-11-10 DIAGNOSIS — I509 Heart failure, unspecified: Secondary | ICD-10-CM | POA: Diagnosis not present

## 2022-11-10 DIAGNOSIS — Z7982 Long term (current) use of aspirin: Secondary | ICD-10-CM | POA: Diagnosis not present

## 2022-11-10 DIAGNOSIS — Z794 Long term (current) use of insulin: Secondary | ICD-10-CM | POA: Diagnosis not present

## 2022-11-10 DIAGNOSIS — I129 Hypertensive chronic kidney disease with stage 1 through stage 4 chronic kidney disease, or unspecified chronic kidney disease: Secondary | ICD-10-CM | POA: Diagnosis present

## 2022-11-10 DIAGNOSIS — B965 Pseudomonas (aeruginosa) (mallei) (pseudomallei) as the cause of diseases classified elsewhere: Secondary | ICD-10-CM | POA: Diagnosis present

## 2022-11-10 DIAGNOSIS — N1832 Chronic kidney disease, stage 3b: Secondary | ICD-10-CM | POA: Diagnosis present

## 2022-11-10 DIAGNOSIS — Z823 Family history of stroke: Secondary | ICD-10-CM

## 2022-11-10 DIAGNOSIS — Z7984 Long term (current) use of oral hypoglycemic drugs: Secondary | ICD-10-CM

## 2022-11-10 DIAGNOSIS — L02611 Cutaneous abscess of right foot: Secondary | ICD-10-CM

## 2022-11-10 DIAGNOSIS — E11628 Type 2 diabetes mellitus with other skin complications: Principal | ICD-10-CM | POA: Diagnosis present

## 2022-11-10 DIAGNOSIS — Z79899 Other long term (current) drug therapy: Secondary | ICD-10-CM

## 2022-11-10 DIAGNOSIS — G4733 Obstructive sleep apnea (adult) (pediatric): Secondary | ICD-10-CM | POA: Diagnosis present

## 2022-11-10 DIAGNOSIS — L97514 Non-pressure chronic ulcer of other part of right foot with necrosis of bone: Secondary | ICD-10-CM | POA: Diagnosis not present

## 2022-11-10 DIAGNOSIS — L03031 Cellulitis of right toe: Secondary | ICD-10-CM | POA: Diagnosis not present

## 2022-11-10 DIAGNOSIS — Z886 Allergy status to analgesic agent status: Secondary | ICD-10-CM

## 2022-11-10 DIAGNOSIS — E1122 Type 2 diabetes mellitus with diabetic chronic kidney disease: Secondary | ICD-10-CM | POA: Diagnosis present

## 2022-11-10 DIAGNOSIS — E11621 Type 2 diabetes mellitus with foot ulcer: Secondary | ICD-10-CM | POA: Diagnosis present

## 2022-11-10 DIAGNOSIS — M869 Osteomyelitis, unspecified: Secondary | ICD-10-CM

## 2022-11-10 DIAGNOSIS — L089 Local infection of the skin and subcutaneous tissue, unspecified: Secondary | ICD-10-CM

## 2022-11-10 DIAGNOSIS — R262 Difficulty in walking, not elsewhere classified: Secondary | ICD-10-CM | POA: Diagnosis not present

## 2022-11-10 DIAGNOSIS — Z8249 Family history of ischemic heart disease and other diseases of the circulatory system: Secondary | ICD-10-CM

## 2022-11-10 LAB — COMPREHENSIVE METABOLIC PANEL
ALT: 19 U/L (ref 0–44)
AST: 24 U/L (ref 15–41)
Albumin: 2.8 g/dL — ABNORMAL LOW (ref 3.5–5.0)
Alkaline Phosphatase: 76 U/L (ref 38–126)
Anion gap: 15 (ref 5–15)
BUN: 28 mg/dL — ABNORMAL HIGH (ref 8–23)
CO2: 25 mmol/L (ref 22–32)
Calcium: 8.6 mg/dL — ABNORMAL LOW (ref 8.9–10.3)
Chloride: 96 mmol/L — ABNORMAL LOW (ref 98–111)
Creatinine, Ser: 1.78 mg/dL — ABNORMAL HIGH (ref 0.61–1.24)
GFR, Estimated: 39 mL/min — ABNORMAL LOW (ref >60)
Glucose, Bld: 117 mg/dL — ABNORMAL HIGH (ref 70–99)
Potassium: 4.5 mmol/L (ref 3.5–5.1)
Sodium: 136 mmol/L (ref 135–145)
Total Bilirubin: 0.5 mg/dL (ref 0.3–1.2)
Total Protein: 6.7 g/dL (ref 6.5–8.1)

## 2022-11-10 LAB — GLUCOSE, CAPILLARY
Glucose-Capillary: 89 mg/dL (ref 70–99)
Glucose-Capillary: 177 mg/dL — ABNORMAL HIGH (ref 70–99)

## 2022-11-10 LAB — PREALBUMIN: Prealbumin: 9 mg/dL — ABNORMAL LOW (ref 18–38)

## 2022-11-10 LAB — PROTIME-INR
INR: 1.2 (ref 0.8–1.2)
Prothrombin Time: 14.6 seconds (ref 11.4–15.2)

## 2022-11-10 LAB — C-REACTIVE PROTEIN: CRP: 19 mg/dL — ABNORMAL HIGH (ref <1.0)

## 2022-11-10 LAB — CBC
HCT: 42.4 % (ref 39.0–52.0)
Hemoglobin: 13.5 g/dL (ref 13.0–17.0)
MCH: 25.4 pg — ABNORMAL LOW (ref 26.0–34.0)
MCHC: 31.8 g/dL (ref 30.0–36.0)
MCV: 79.7 fL — ABNORMAL LOW (ref 80.0–100.0)
Platelets: 390 10*3/uL (ref 150–400)
RBC: 5.32 MIL/uL (ref 4.22–5.81)
RDW: 15 % (ref 11.5–15.5)
WBC: 16.2 10*3/uL — ABNORMAL HIGH (ref 4.0–10.5)
nRBC: 0 % (ref 0.0–0.2)

## 2022-11-10 LAB — HEMOGLOBIN A1C
Hgb A1c MFr Bld: 7.8 % — ABNORMAL HIGH (ref 4.8–5.6)
Mean Plasma Glucose: 177.16 mg/dL

## 2022-11-10 LAB — SEDIMENTATION RATE: Sed Rate: 103 mm/hr — ABNORMAL HIGH (ref 0–16)

## 2022-11-10 MED ORDER — SODIUM CHLORIDE 0.9% FLUSH
3.0000 mL | Freq: Two times a day (BID) | INTRAVENOUS | Status: DC
  Administered 2022-11-10: 3 mL via INTRAVENOUS

## 2022-11-10 MED ORDER — SODIUM CHLORIDE 0.9 % IV SOLN: 250.0000 mL | INTRAVENOUS | Status: DC | PRN

## 2022-11-10 MED ORDER — METRONIDAZOLE 500 MG PO TABS
500.0000 mg | ORAL_TABLET | Freq: Two times a day (BID) | ORAL | Status: DC
  Administered 2022-11-10 – 2022-11-13 (×6): 500 mg via ORAL
  Filled 2022-11-10 (×6): qty 1

## 2022-11-10 MED ORDER — ONDANSETRON HCL 4 MG PO TABS: 4.0000 mg | ORAL_TABLET | Freq: Four times a day (QID) | ORAL | Status: DC | PRN

## 2022-11-10 MED ORDER — TIMOLOL MALEATE 0.5 % OP SOLN
1.0000 [drp] | Freq: Two times a day (BID) | OPHTHALMIC | Status: DC
  Administered 2022-11-10 – 2022-11-13 (×6): 1 [drp] via OPHTHALMIC
  Filled 2022-11-10: qty 5

## 2022-11-10 MED ORDER — BISACODYL 5 MG PO TBEC: 5.0000 mg | DELAYED_RELEASE_TABLET | Freq: Every day | ORAL | Status: DC | PRN

## 2022-11-10 MED ORDER — BRIMONIDINE TARTRATE-TIMOLOL 0.2-0.5 % OP SOLN: 1.0000 [drp] | Freq: Two times a day (BID) | OPHTHALMIC | Status: DC

## 2022-11-10 MED ORDER — PANTOPRAZOLE SODIUM 40 MG PO TBEC
40.0000 mg | DELAYED_RELEASE_TABLET | Freq: Every day | ORAL | Status: DC
  Administered 2022-11-11 – 2022-11-13 (×3): 40 mg via ORAL
  Filled 2022-11-10 (×3): qty 1

## 2022-11-10 MED ORDER — SODIUM CHLORIDE 0.9% FLUSH
3.0000 mL | INTRAVENOUS | Status: DC | PRN
  Administered 2022-11-10: 3 mL via INTRAVENOUS

## 2022-11-10 MED ORDER — OXYCODONE HCL 5 MG PO TABS: 5.0000 mg | ORAL_TABLET | ORAL | Status: DC | PRN

## 2022-11-10 MED ORDER — SODIUM CHLORIDE 0.9 % IV SOLN
2.0000 g | INTRAVENOUS | Status: DC
  Administered 2022-11-10 – 2022-11-12 (×3): 2 g via INTRAVENOUS
  Filled 2022-11-10 (×3): qty 20

## 2022-11-10 MED ORDER — SPIRONOLACTONE 25 MG PO TABS
25.0000 mg | ORAL_TABLET | Freq: Every day | ORAL | Status: DC
  Administered 2022-11-11 – 2022-11-13 (×3): 25 mg via ORAL
  Filled 2022-11-10 (×3): qty 1

## 2022-11-10 MED ORDER — AMLODIPINE BESYLATE 10 MG PO TABS
10.0000 mg | ORAL_TABLET | Freq: Every day | ORAL | Status: DC
  Administered 2022-11-12 – 2022-11-13 (×2): 10 mg via ORAL
  Filled 2022-11-10 (×2): qty 1

## 2022-11-10 MED ORDER — ONDANSETRON HCL 4 MG/2ML IJ SOLN: 4.0000 mg | Freq: Four times a day (QID) | INTRAMUSCULAR | Status: DC | PRN

## 2022-11-10 MED ORDER — METOPROLOL SUCCINATE ER 50 MG PO TB24
100.0000 mg | ORAL_TABLET | Freq: Every day | ORAL | Status: DC
  Administered 2022-11-11 – 2022-11-13 (×3): 100 mg via ORAL
  Filled 2022-11-10 (×4): qty 2

## 2022-11-10 MED ORDER — POLYETHYLENE GLYCOL 3350 17 G PO PACK: 17.0000 g | PACK | Freq: Every day | ORAL | Status: DC | PRN

## 2022-11-10 MED ORDER — TORSEMIDE 20 MG PO TABS
20.0000 mg | ORAL_TABLET | Freq: Every day | ORAL | Status: DC
  Administered 2022-11-11 – 2022-11-12 (×2): 20 mg via ORAL
  Filled 2022-11-10 (×2): qty 1

## 2022-11-10 MED ORDER — SIMVASTATIN 20 MG PO TABS
20.0000 mg | ORAL_TABLET | Freq: Every day | ORAL | Status: DC
  Administered 2022-11-12: 20 mg via ORAL
  Filled 2022-11-10: qty 1

## 2022-11-10 MED ORDER — BRIMONIDINE TARTRATE 0.2 % OP SOLN
1.0000 [drp] | Freq: Two times a day (BID) | OPHTHALMIC | Status: DC
  Administered 2022-11-10 – 2022-11-13 (×6): 1 [drp] via OPHTHALMIC
  Filled 2022-11-10: qty 5

## 2022-11-10 MED ORDER — LOTEPREDNOL ETABONATE 0.5 % OP SUSP
1.0000 [drp] | Freq: Three times a day (TID) | OPHTHALMIC | Status: DC
  Administered 2022-11-10 – 2022-11-13 (×5): 1 [drp] via OPHTHALMIC
  Filled 2022-11-10: qty 5

## 2022-11-10 MED ORDER — TRAMADOL HCL 50 MG PO TABS
50.0000 mg | ORAL_TABLET | Freq: Three times a day (TID) | ORAL | Status: DC | PRN
  Filled 2022-11-10: qty 1

## 2022-11-10 MED ORDER — ACETAMINOPHEN 325 MG PO TABS
650.0000 mg | ORAL_TABLET | Freq: Four times a day (QID) | ORAL | Status: DC | PRN
  Administered 2022-11-10 – 2022-11-11 (×2): 650 mg via ORAL
  Filled 2022-11-10 (×2): qty 2

## 2022-11-10 MED ORDER — INSULIN ASPART 100 UNIT/ML IJ SOLN: 0.0000 [IU] | Freq: Every day | INTRAMUSCULAR | Status: DC

## 2022-11-10 MED ORDER — INSULIN ASPART 100 UNIT/ML IJ SOLN: 0.0000 [IU] | Freq: Three times a day (TID) | INTRAMUSCULAR | Status: DC

## 2022-11-10 MED ORDER — BENAZEPRIL HCL 20 MG PO TABS
20.0000 mg | ORAL_TABLET | Freq: Every day | ORAL | Status: DC
  Administered 2022-11-12 – 2022-11-13 (×2): 20 mg via ORAL
  Filled 2022-11-10 (×2): qty 1

## 2022-11-10 NOTE — H&P (Addendum)
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 Podiatry suggested an MRI and ABIs with empiric broad-spectrum antibiotics coverage, and asked [facility] to arrange for a direct admission.   Assessment/Plan  Diabetic foot infection Wound itself dates back to summer 2023, but has become progressively more problematic over the last 3-4 weeks - evaluate for osteo w/ MRI - check ABIs - strict CBG control - Podiatry (who knows him) to see - initiate empiric abx tx   HTN Monitor trend on home meds   HLD Continue usual home meds   CKD IIIb Most recent crt on record is 1.86 in 2022 w/ GFR 38   DM2 Strict control indicated - check A1c   Obesity - Body mass index is 40.46 kg/m.  DVT prophylaxis: SCDs until after surgery  Code Status:   Code Status: Full Code Family Communication: spoke w/ wife at bedside  Disposition Plan:  Admit to Inpatient   Review of Systems: As per HPI otherwise 10 point review of systems negative.   Past Medical History:  Diagnosis Date   Arthritis    "knees" (01/01/2016)   Cataract    OS   Chronic kidney disease    Diabetes mellitus    Diabetic retinopathy    NPDR OU   Difficult intubation    Erectile dysfunction    GERD (gastroesophageal reflux disease)    Glaucoma    OS   HTN (hypertension)    Hyperlipidemia    Obesity    OSA on CPAP    Restrictive lung disease    Sensory hearing loss, bilateral    Sleep apnea     Past Surgical History:  Procedure Laterality Date   CATARACT EXTRACTION Right 03/23/2020    Dr. Chalmers Guestoy Whitaker   COLONOSCOPY     EYE SURGERY Right 03/23/2020   Cat Sx - Dr. Chalmers Guestoy Whitaker   GLAUCOMA SURGERY Bilateral    "laser"   JOINT REPLACEMENT Left    knee   LAPAROSCOPIC CHOLECYSTECTOMY  09/17/2018   TOTAL KNEE ARTHROPLASTY Left 11/09/2015   Procedure: LEFT TOTAL KNEE ARTHROPLASTY;  Surgeon: Ollen GrossFrank Aluisio, MD;  Location: [facility];  Service: Orthopedics;  Laterality: Left;    Family History  Family History  Problem Relation Age of Onset   Stroke Mother    Diabetes Mother    Hypertension Mother    Heart disease Mother    Hypertension Brother    Diabetes Brother    Diabetes Sister     Social History   reports that he has never smoked. He has never used smokeless tobacco. He reports that he does not drink alcohol and does not use drugs. Married. Lives in [place] w/ his wife of many years. 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   Allergies Allergies  Allergen Reactions   Nsaids Other (See Comments)    GI BLEED   Aspirin Other (See Comments)    STOMACH ULCER  STOMACH ULCER  STOMACH ULCER  STOMACH ULCER  STOMACH ULCER  STOMACH ULCER  STOMACH ULCER    Other Other (  See Comments)    blisters    Prior to Admission medications   Medication Sig Start Date End Date Taking? Authorizing Provider  amLODipine-benazepril (LOTREL) 10-20 MG capsule     [provider]  amoxicillin (AMOXIL) 500 MG capsule TAKE 4 CAPSULE 1HR PRIOR TO DENTAL APPT 09/06/18   [provider]  aspirin 81 MG chewable tablet Chew 81 mg by mouth daily.    [provider]  BD PEN NEEDLE NANO U/F 32G X 4 MM MISC USE AS DIRECTED 4 TIMES A DAY 03/30/18   [provider]  bimatoprost (LUMIGAN) 0.01 % SOLN Apply to eye. 09/16/14   [provider]  Blood Glucose Monitoring Suppl (ACCU-CHEK GUIDE) w/Device KIT  08/29/19   [provider]  brimonidine-timolol (COMBIGAN) 0.2-0.5 % ophthalmic solution Place 1 drop into the left eye 2 (two) times daily.   04/12/15   [provider]  clotrimazole-betamethasone (LOTRISONE) cream Apply 1 Application topically 2 (two) times daily. 08/03/22   Felecia Shelling, DPM  docusate sodium (COLACE) 100 MG capsule     [provider]  doxycycline (VIBRA-TABS) 100 MG tablet Take 1 tablet (100 mg total) by mouth 2 (two) times daily. 10/27/22   McDonald, Rachelle Hora, DPM  FML FORTE 0.25 % ophthalmic suspension 1 drop 2 (two) times daily. 02/24/22   [provider]  gabapentin (NEURONTIN) 300 MG capsule     [provider]  HUMALOG KWIKPEN 100 UNIT/ML KiwkPen Inject 0-60 Units into the skin 3 (three) times daily. Per sliding scale three times daily with meals 03/13/18   [provider]  JARDIANCE 25 MG TABS tablet Take 25 mg by mouth daily. 02/21/18   [provider]  Lancets Letta Pate ULTRASOFT) lancets USE AS INSTRUCTED 06/28/13   [provider]  LANTUS SOLOSTAR 100 UNIT/ML Solostar Pen  04/16/20   [provider]  linaclotide Karlene Einstein) 145 MCG CAPS capsule     [provider]  liraglutide (VICTOZA) 18 MG/3ML SOPN     [provider]  LOTEMAX 0.5 % GEL Apply 1 drop to eye 3 (three) times daily. 12/29/21   [provider]  metoprolol succinate (TOPROL-XL) 100 MG 24 hr tablet Take 1 tablet by mouth daily. 04/14/20   [provider]  metoprolol tartrate (LOPRESSOR) 100 MG tablet Take 1 tablet (100 mg total) by mouth once for 1 dose. Take 1 tablet two hours prior to CT scan 02/17/21 02/17/21  Quintella Reichert, MD  mirabegron ER (MYRBETRIQ) 50 MG TB24 tablet TAKE 1 TABLET BY MOUTH EVERY DAY 09/27/19   [provider]  Multiple Vitamin (MULTIVITAMIN) tablet Take 1 tablet by mouth daily. Centrum silver    [provider]  NOVOLOG FLEXPEN 100 UNIT/ML FlexPen  10/22/19   [provider]  ONE TOUCH ULTRA TEST test strip  03/12/18   [provider]  pantoprazole (PROTONIX) 40 MG tablet Take 1 tablet by  mouth daily. 11/15/21   [provider]  prednisoLONE acetate (PRED FORTE) 1 % ophthalmic suspension Place 1 drop into the right eye every hour while awake.  03/12/20   [provider]  Probiotic Product (PHILLIPS COLON HEALTH) CAPS Take 1 capsule by mouth daily.    [provider]  silver sulfADIAZINE (SILVADENE) 1 % cream Apply 1 Application topically daily. 10/27/22   Edwin Cap, DPM  simvastatin (ZOCOR) 20 MG tablet Take 20 mg by mouth daily at 6 PM. 10/23/19   [provider]  spironolactone (ALDACTONE)  25 MG tablet Take 25 mg by mouth daily.  01/30/18   [provider]  torsemide (DEMADEX) 20 MG tablet TAKE 1 TABLET BY MOUTH EVERY DAY 09/27/19   [provider]  UNABLE TO FIND TAKE 2 TEASPOONS(10MLS) BY MOUTH 4 TIMES A DAY FOR 5 DAYS 06/04/18   [provider]  vardenafil (LEVITRA) 20 MG tablet Take 20 mg by mouth as needed for erectile dysfunction.  08/07/12   [provider]    Physical Exam: Vitals:   11/10/22 1542  BP: 128/71  Pulse: 99  Resp: 19  Temp: 98.9 F (37.2 C)  TempSrc: Oral  SpO2: 100%  Height: 5\' 10"  (1.778 m)    Constitutional: NAD, calm, comfortable Eyes: PERRL, lids and conjunctivae normal ENMT: Mucous membranes are moist.  Respiratory: clear to auscultation bilaterally, no wheezing, no crackles. Normal respiratory effort. No accessory muscle use.  Cardiovascular: Regular rate and rhythm, no murmurs / rubs / gallops. 1+ R LE edema - trace edema L LE  Abdomen: No tenderness or masses to palpation. No hepatosplenomegaly. Bowel sounds positive. Not distended. Soft.  Musculoskeletal: No clubbing / cyanosis. No joint deformity upper and lower extremities. No contractures. Normal muscle tone.  Skin: ulceration of medial tip of R great toe with additional ulceration proximal to nail bed on same toe w/o purulent drainage but with odor suggestive of dead tissue Neurologic: CN 2-12 grossly intact B.  Sensation intact.  Psychiatric: Normal judgment and insight. Alert and oriented x 3. Normal mood.    Labs on Admission:  Labs ordered at time of admission and are currently pending.    Lonia Blood, MD [facility] Office  [phone] Pager - Text Page per Amion as per below:  On-Call/Text Page:      [email or web address]  If 7PM-7AM, please contact night-coverage [email or web address] 11/10/2022, 6:28 PM

## 2022-11-10 NOTE — Progress Notes (Signed)
  Subjective:  Patient ID: Micheal Morris, male    DOB: 1945/11/09,  MRN: [id number]  Chief Complaint  Patient presents with   Foot Ulcer    2 week wound care, after MRI to review     77 y.o. male presents with the above complaint. History confirmed with patient.  He returns for follow-up he notes that it is worsening his wife is here with him as well and notes that it has been worsening.  She says that she called earlier this week because he was out of antibiotics and running a fever, did not go to ER PCP or urgent care.  Temperature all week was between 99 and 100.  MRI is not scheduled till next week  Objective:  Physical Exam: warm, good capillary refill and weakly palpable pedal pulses, he has lymphedema bilaterally, there is full-thickness ulceration on the right hallux that probes directly to bone it is significantly worsened since my last examination.  Has malodorous purulent drainage.  Significant right foot edema that is greater than the left and asymmetric now   Today's Vitals   11/10/22 0902  BP: 130/67  Pulse: 83  Temp: 99.1 F (37.3 C)   There is no height or weight on file to calculate BMI.   Radiographs: Multiple views x-ray of the right foot: Possible erosion of distal medial tuft of the phalanx, no gas Assessment:   1. Ulcer of great toe, right, with fat layer exposed   2. Type 2 diabetes mellitus with right diabetic foot infection   3. Osteomyelitis of great toe of right foot   4. Cellulitis and abscess of toe of right foot      Plan:  Patient was evaluated and treated and all questions answered.   Ulcer right hallux -Unfortunately has infection that has significantly worsened and I suspect he likely has significant osteomyelitis now.  I recommended admission to the hospital and arrange for direct admission to the hospitalist service, their assistance is appreciated.  On admission I recommend broad-spectrum IV antibiotics and MRI and I discussed with the  patient and his wife that he likely is facing at least a partial amputation and results of the MRI and soft tissue coverage.  N.p.o. past midnight, will plan to take 2 OR tomorrow if he is able to complete his MRI, also recommend noninvasive vascular testing.     No follow-ups on file.

## 2022-11-11 ENCOUNTER — Encounter (HOSPITAL_COMMUNITY)
Admission: AD | Disposition: A | Discharge status: Home / Self Care | Payer: Self-pay | Source: Ambulatory Visit | Attending: Internal Medicine

## 2022-11-11 ENCOUNTER — Inpatient Hospital Stay (HOSPITAL_COMMUNITY): Payer: Medicare PPO | Admitting: Anesthesiology

## 2022-11-11 ENCOUNTER — Inpatient Hospital Stay (HOSPITAL_COMMUNITY): Payer: Medicare PPO

## 2022-11-11 ENCOUNTER — Other Ambulatory Visit: Payer: Self-pay

## 2022-11-11 ENCOUNTER — Encounter (HOSPITAL_COMMUNITY): Payer: Self-pay | Admitting: Internal Medicine

## 2022-11-11 DIAGNOSIS — I509 Heart failure, unspecified: Secondary | ICD-10-CM | POA: Diagnosis not present

## 2022-11-11 DIAGNOSIS — E11628 Type 2 diabetes mellitus with other skin complications: Secondary | ICD-10-CM | POA: Diagnosis not present

## 2022-11-11 DIAGNOSIS — L97514 Non-pressure chronic ulcer of other part of right foot with necrosis of bone: Secondary | ICD-10-CM | POA: Diagnosis not present

## 2022-11-11 DIAGNOSIS — E11621 Type 2 diabetes mellitus with foot ulcer: Secondary | ICD-10-CM | POA: Diagnosis not present

## 2022-11-11 DIAGNOSIS — E1149 Type 2 diabetes mellitus with other diabetic neurological complication: Secondary | ICD-10-CM

## 2022-11-11 DIAGNOSIS — I110 Hypertensive heart disease with heart failure: Secondary | ICD-10-CM | POA: Diagnosis not present

## 2022-11-11 DIAGNOSIS — L97509 Non-pressure chronic ulcer of other part of unspecified foot with unspecified severity: Secondary | ICD-10-CM | POA: Diagnosis not present

## 2022-11-11 HISTORY — PX: AMPUTATION TOE: SHX6595

## 2022-11-11 LAB — GLUCOSE, CAPILLARY
Glucose-Capillary: 121 mg/dL — ABNORMAL HIGH (ref 70–99)
Glucose-Capillary: 93 mg/dL (ref 70–99)
Glucose-Capillary: 104 mg/dL — ABNORMAL HIGH (ref 70–99)
Glucose-Capillary: 110 mg/dL — ABNORMAL HIGH (ref 70–99)
Glucose-Capillary: 111 mg/dL — ABNORMAL HIGH (ref 70–99)
Glucose-Capillary: 262 mg/dL — ABNORMAL HIGH (ref 70–99)

## 2022-11-11 LAB — CULTURE, BLOOD (ROUTINE X 2): Special Requests: ADEQUATE

## 2022-11-11 LAB — WOUND CULTURE: SPECIMEN QUALITY:: ADEQUATE

## 2022-11-11 LAB — AEROBIC/ANAEROBIC CULTURE W GRAM STAIN (SURGICAL/DEEP WOUND)

## 2022-11-11 SURGERY — AMPUTATION, TOE
Anesthesia: Monitor Anesthesia Care | Site: Toe | Laterality: Right

## 2022-11-11 MED ORDER — BUPIVACAINE HCL (PF) 0.25 % IJ SOLN
INTRAMUSCULAR | Status: DC | PRN
  Administered 2022-11-11: 12 mL

## 2022-11-11 MED ORDER — ONDANSETRON HCL 4 MG/2ML IJ SOLN: 4.0000 mg | Freq: Four times a day (QID) | INTRAMUSCULAR | Status: DC | PRN

## 2022-11-11 MED ORDER — PROPOFOL 10 MG/ML IV BOLUS
INTRAVENOUS | Status: AC
  Filled 2022-11-11: qty 20

## 2022-11-11 MED ORDER — LACTATED RINGERS IV SOLN: INTRAVENOUS | Status: DC

## 2022-11-11 MED ORDER — MIDAZOLAM HCL 2 MG/2ML IJ SOLN
INTRAMUSCULAR | Status: AC
  Filled 2022-11-11: qty 2

## 2022-11-11 MED ORDER — ORAL CARE MOUTH RINSE: 15.0000 mL | Freq: Once | OROMUCOSAL | Status: AC

## 2022-11-11 MED ORDER — ADULT MULTIVITAMIN W/MINERALS CH
1.0000 | ORAL_TABLET | Freq: Every day | ORAL | Status: DC
  Administered 2022-11-12 – 2022-11-13 (×2): 1 via ORAL
  Filled 2022-11-11 (×2): qty 1

## 2022-11-11 MED ORDER — CHLORHEXIDINE GLUCONATE 0.12 % MT SOLN: 15.0000 mL | Freq: Once | OROMUCOSAL | Status: AC

## 2022-11-11 MED ORDER — SODIUM CHLORIDE 0.9 % IV SOLN: INTRAVENOUS | Status: DC

## 2022-11-11 MED ORDER — FENTANYL CITRATE (PF) 100 MCG/2ML IJ SOLN
INTRAMUSCULAR | Status: AC
  Filled 2022-11-11: qty 2

## 2022-11-11 MED ORDER — JUVEN PO PACK
1.0000 | PACK | Freq: Two times a day (BID) | ORAL | Status: DC
  Administered 2022-11-12 – 2022-11-13 (×3): 1 via ORAL
  Filled 2022-11-11 (×3): qty 1

## 2022-11-11 MED ORDER — OXYCODONE HCL 5 MG/5ML PO SOLN: 5.0000 mg | Freq: Once | ORAL | Status: DC | PRN

## 2022-11-11 MED ORDER — INSULIN ASPART 100 UNIT/ML IJ SOLN: 0.0000 [IU] | INTRAMUSCULAR | Status: DC | PRN

## 2022-11-11 MED ORDER — PROPOFOL 10 MG/ML IV BOLUS
INTRAVENOUS | Status: DC | PRN
  Administered 2022-11-11: 30 mg via INTRAVENOUS
  Administered 2022-11-11 (×2): 20 mg via INTRAVENOUS
  Administered 2022-11-11: 30 mg via INTRAVENOUS
  Administered 2022-11-11 (×2): 20 mg via INTRAVENOUS

## 2022-11-11 MED ORDER — FENTANYL CITRATE (PF) 100 MCG/2ML IJ SOLN: 25.0000 ug | INTRAMUSCULAR | Status: DC | PRN

## 2022-11-11 MED ORDER — OXYCODONE HCL 5 MG PO TABS: 5.0000 mg | ORAL_TABLET | Freq: Once | ORAL | Status: DC | PRN

## 2022-11-11 MED ORDER — CHLORHEXIDINE GLUCONATE 0.12 % MT SOLN
OROMUCOSAL | Status: AC
  Administered 2022-11-11: 15 mL via OROMUCOSAL
  Filled 2022-11-11: qty 15

## 2022-11-11 MED ORDER — INSULIN ASPART 100 UNIT/ML IJ SOLN
0.0000 [IU] | INTRAMUSCULAR | Status: DC
  Administered 2022-11-12: 5 [IU] via SUBCUTANEOUS
  Administered 2022-11-12: 11 [IU] via SUBCUTANEOUS
  Administered 2022-11-12: 2 [IU] via SUBCUTANEOUS

## 2022-11-11 MED ORDER — LINACLOTIDE 145 MCG PO CAPS
290.0000 ug | ORAL_CAPSULE | Freq: Every day | ORAL | Status: DC
  Administered 2022-11-12 – 2022-11-13 (×2): 290 ug via ORAL
  Filled 2022-11-11 (×3): qty 2

## 2022-11-11 MED ORDER — PROSOURCE PLUS PO LIQD
30.0000 mL | Freq: Three times a day (TID) | ORAL | Status: DC
  Administered 2022-11-12 – 2022-11-13 (×4): 30 mL via ORAL
  Filled 2022-11-11 (×5): qty 30

## 2022-11-11 MED ORDER — BUPIVACAINE HCL (PF) 0.25 % IJ SOLN
INTRAMUSCULAR | Status: AC
  Filled 2022-11-11: qty 30

## 2022-11-11 SURGICAL SUPPLY — 28 items
BLADE LONG MED 31X9 (MISCELLANEOUS) IMPLANT
COVER SURGICAL LIGHT HANDLE (MISCELLANEOUS) ×2 IMPLANT
CUFF TOURN SGL QUICK 24 (TOURNIQUET CUFF)
CUFF TRNQT CYL 24X4X16.5-23 (TOURNIQUET CUFF) IMPLANT
DRAPE SURG 17X23 STRL (DRAPES) ×2 IMPLANT
GAUZE SPONGE 2X2 8PLY STRL LF (GAUZE/BANDAGES/DRESSINGS) IMPLANT
GAUZE SPONGE 4X4 12PLY STRL (GAUZE/BANDAGES/DRESSINGS) IMPLANT
GAUZE XEROFORM 1X8 LF (GAUZE/BANDAGES/DRESSINGS) IMPLANT
GLOVE BIOGEL M 7.0 STRL (GLOVE) ×2 IMPLANT
GLOVE PI ORTHO PRO STRL 7.5 (GLOVE) ×2 IMPLANT
GOWN STRL REUS W/ TWL LRG LVL3 (GOWN DISPOSABLE) ×4 IMPLANT
GOWN STRL REUS W/TWL LRG LVL3 (GOWN DISPOSABLE) ×2
KIT BASIN OR (CUSTOM PROCEDURE TRAY) ×2 IMPLANT
KIT TURNOVER KIT B (KITS) ×2 IMPLANT
NDL HYPO 25GX1X1/2 BEV (NEEDLE) IMPLANT
NEEDLE HYPO 25GX1X1/2 BEV (NEEDLE) IMPLANT
NS IRRIG 1000ML POUR BTL (IV SOLUTION) ×2 IMPLANT
PACK ORTHO EXTREMITY (CUSTOM PROCEDURE TRAY) ×2 IMPLANT
PAD ARMBOARD 7.5X6 YLW CONV (MISCELLANEOUS) ×4 IMPLANT
SOL PREP POV-IOD 4OZ 10% (MISCELLANEOUS) ×6 IMPLANT
SPECIMEN JAR SMALL (MISCELLANEOUS) ×2 IMPLANT
SUT ETHILON 3 0 PS 1 (SUTURE) ×2 IMPLANT
SYR CONTROL 10ML LL (SYRINGE) IMPLANT
TOWEL GREEN STERILE (TOWEL DISPOSABLE) ×2 IMPLANT
TOWEL GREEN STERILE FF (TOWEL DISPOSABLE) ×2 IMPLANT
TUBE CONNECTING 12X1/4 (SUCTIONS) IMPLANT
UNDERPAD 30X36 HEAVY ABSORB (UNDERPADS AND DIAPERS) ×2 IMPLANT
WATER STERILE IRR 1000ML POUR (IV SOLUTION) ×2 IMPLANT

## 2022-11-11 NOTE — Progress Notes (Signed)
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 Podiatry suggested an MRI and ABIs with empiric broad-spectrum antibiotics coverage, and asked [facility] to arrange for a direct admission.   Consultants:  Podiatry  Goals of Care:  Code Status: Full Code   DVT prophylaxis: SCDs  Interim Hx: Afebrile.  Vital signs stable. Pt unable to be examined today. Not in his room despite 2 visits to unit (ABIs then OR). Review of chart notes medical stability. To OR today w/ Podiatry.   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  Family Communication:  Disposition:  will depend upon performance post OR   Objective: Blood pressure 117/67, pulse 89, temperature 98.7 F (37.1 C), temperature source Oral, resp. rate 18, height  (1.778 m), SpO2 99 %.  Intake/Output Summary (Last 24 hours) at 11/11/2022 0951 Last  data filed at 11/11/2022 0509 Gross per 24 hour  Intake 100 ml  Output 650 ml  Net -550 ml   There were no vitals filed for this visit.  Examination: See above   CBC: Recent Labs  Lab 11/10/22 1752  WBC 16.2*  HGB 13.5  HCT 42.4  MCV 79.7*  PLT 390   Basic Metabolic Panel: Recent Labs  Lab 11/10/22 1752  NA 136  K 4.5  CL 96*  CO2 25  GLUCOSE 117*  BUN 28*  CREATININE 1.78*  CALCIUM 8.6*   GFR: CrCl cannot be calculated (Unknown ideal weight.).   Scheduled Meds:  amLODipine  10 mg Oral Daily   And   benazepril  20 mg Oral Daily   brimonidine  1 drop Left Eye BID   And   timolol  1 drop Left Eye BID   insulin aspart  0-15 Units Subcutaneous TID WC   insulin aspart  0-5 Units Subcutaneous QHS   loteprednol  1 drop Both Eyes TID   metoprolol succinate  100 mg Oral Daily   metroNIDAZOLE  500 mg Oral Q12H   pantoprazole  40 mg Oral Daily   simvastatin  20 mg Oral q1800   sodium chloride flush  3 mL Intravenous Q12H   spironolactone  25 mg Oral Daily   torsemide  20 mg Oral Daily   Continuous Infusions:  sodium chloride     cefTRIAXone (ROCEPHIN)  IV 2 g (11/10/22 1818)     LOS: 1 day   Tinnie Gens  Silvestre Gunner, MD [facility] Office  [phone] Pager - Text Page per Amion  If 7PM-7AM, please contact night-coverage per Amion 11/11/2022, 9:51 AM

## 2022-11-11 NOTE — Progress Notes (Signed)
Initial Nutrition Assessment  DOCUMENTATION CODES:   Not applicable  INTERVENTION:  Once diet resumes, recommend: Carb modified diet to help maintain blood sugar control for optimal wound healing 30 ml ProSource Plus TID, each supplement provides 100 kcals and 15 grams protein.  Juven BID to support wound healing MVI with minerals daily Obtain updated measured weight  NUTRITION DIAGNOSIS:   Increased nutrient needs related to post-op healing, wound healing as evidenced by estimated needs.  GOAL:   Patient will meet greater than or equal to 90% of their needs  MONITOR:   PO intake, Supplement acceptance, Labs, Weight trends, Diet advancement, Skin  REASON FOR ASSESSMENT:   Consult Wound healing  ASSESSMENT:   Pt admitted with diabetic foot infection. PMH significant for T2DM, HTN, HLD, CKD and chronic R hallux diabetic ulcer infection.  Pending evaluation for osteo with MRI and ABIs.    NPO for partial toe amputation today.   Spoke with pt at bedside. On CPAP at time of visit. No family present. He reports that over the last few months, his appetite has gradually been declining. His dietary recall includes: toast with scrambled eggs or a sausage and egg biscuit. Lunch is usually an apple or an orange. Dinner includes salad with a meat, rice and cornbread. Beverages include diet green tea, diet mountain dew, water or coffee.   He denies difficulty chewing or swallowing. He typically takes a MVI daily. He states that his blood sugars are usually variable but average around 180. He endorses a few instances of low blood sugar around 50 where he felt lethargic.   Last documented weight on file was 2022. Pt states that his weight has remained elevated. He states that around 1 month ago at a doctor's appointment, his weight was about 280 lbs. Pt is cold with multiple items on bed, could not obtain accurate updated measured weight at this time. Nursing ordered placed.   Pt states  that d/t LE swelling and pain, his mobility has been more limited recently.   Edema: moderate pitting BLE  Medications: SSI 0-15 units q4h, linzess, abx, protonix, torsemide, IV abx  Labs: BUN 28, Cr 1.78, GFR 39, HgbA1c 7.8%, CBG's 89-177 x24 hours  NUTRITION - FOCUSED PHYSICAL EXAM:  Flowsheet Row Most Recent Value  Orbital Region No depletion  Upper Arm Region No depletion  Thoracic and Lumbar Region No depletion  Buccal Region No depletion  Temple Region No depletion  Clavicle Bone Region No depletion  Clavicle and Acromion Bone Region No depletion  Scapular Bone Region No depletion  Dorsal Hand No depletion  Patellar Region No depletion  Anterior Thigh Region No depletion  Posterior Calf Region No depletion  Edema (RD Assessment) Moderate  [BLE]  Hair Reviewed  Eyes Reviewed  Mouth Reviewed  Skin Reviewed  Nails Reviewed       Diet Order:   Diet Order             Diet NPO time specified Except for: Sips with Meds  Diet effective midnight                   EDUCATION NEEDS:   Education needs have been addressed  Skin:  Skin Assessment: Reviewed RN Assessment  Last BM:  4/10  Height:   Ht Readings from Last 1 Encounters:  11/11/22 5\' 10"  (1.778 m)    Weight:   Wt Readings from Last 1 Encounters:  03/15/21 127.9 kg    Ideal Body Weight:  75.5 kg  BMI:  Body mass index is 40.46 kg/m.  Estimated Nutritional Needs:   Kcal:  2000-2200  Protein:  105-130g  Fluid:  >/=2L  Drusilla Kanner, RDN, LDN Clinical Nutrition

## 2022-11-11 NOTE — Brief Op Note (Signed)
11/11/2022  7:02 PM  PATIENT:  Micheal Morris  77 y.o. male  PRE-OPERATIVE DIAGNOSIS:  DIABETES INFECTION  POST-OPERATIVE DIAGNOSIS:  DIABETES INFECTION  PROCEDURE:  Procedure(s): RIGHT AMPUTATION  of  GREAT TOE (Right)  SURGEON:  Surgeon(s) and Role:    * Murrel Freet, Rachelle Hora, DPM - Primary  PHYSICIAN ASSISTANT:   ASSISTANTS: none   ANESTHESIA:   local and MAC  EBL:  20cc    DRAINS: none   LOCAL MEDICATIONS USED:  MARCAINE    and Amount: 12 ml  SPECIMEN:  right great toe  DISPOSITION OF SPECIMEN:  path and micro  COUNTS:  YES  TOURNIQUET:  * Missing tourniquet times found for documented tourniquets in log: [id number] *  DICTATION: .Note written in EPIC  PLAN OF CARE: Admit to inpatient   PATIENT DISPOSITION:  PACU - hemodynamically stable.   Delay start of Pharmacological VTE agent (>24hrs) due to surgical blood loss or risk of bleeding: no  -Expect surgical cure of infection. F/u with 10 days PO abx per culture data at discharge -WBAT in post op shoe, ordered -Should be able to go home tomorrow or Sunday pending progress -Perfusion was excellent.    Sharl Ma, DPM 11/11/2022

## 2022-11-11 NOTE — Transfer of Care (Signed)
Immediate Anesthesia Transfer of Care Note  Patient: PRAVIN RUMMEL  Procedure(s) Performed: RIGHT AMPUTATION  of  GREAT TOE (Right: Toe)  Patient Location: PACU  Anesthesia Type:MAC  Level of Consciousness: awake, alert , and oriented  Airway & Oxygen Therapy: Patient Spontanous Breathing  Post-op Assessment: Report given to RN and Post -op Vital signs reviewed and stable  Post vital signs: Reviewed and stable  Last Vitals: See PACU vitals Vitals Value Taken Time  BP    Temp    Pulse 97 11/11/22 1906  Resp 12 11/11/22 1906  SpO2 97 % 11/11/22 1906  Vitals shown include unvalidated device data.  Last Pain:  Vitals:   11/11/22 1655  TempSrc: Oral  PainSc: 0-No pain      Patients Stated Pain Goal: 0 (11/11/22 8891)  Complications: No notable events documented.

## 2022-11-11 NOTE — Anesthesia Preprocedure Evaluation (Signed)
Anesthesia Evaluation  Patient identified by MRN, date of birth, ID band Patient awake    Reviewed: Allergy & Precautions, H&P , NPO status , Patient's Chart, lab work & pertinent test results  History of Anesthesia Complications (+) DIFFICULT AIRWAY and history of anesthetic complications  Airway Mallampati: II   Neck ROM: full    Dental   Pulmonary sleep apnea    breath sounds clear to auscultation       Cardiovascular hypertension, +CHF   Rhythm:regular Rate:Normal     Neuro/Psych  Neuromuscular disease    GI/Hepatic ,GERD  ,,  Endo/Other  diabetes, Type 2    Renal/GU Renal InsufficiencyRenal disease     Musculoskeletal  (+) Arthritis ,    Abdominal   Peds  Hematology   Anesthesia Other Findings   Reproductive/Obstetrics                             Anesthesia Physical Anesthesia Plan  ASA: 3  Anesthesia Plan: MAC   Post-op Pain Management: Regional block*   Induction: Intravenous  PONV Risk Score and Plan: 1 and Ondansetron, Propofol infusion and Treatment may vary due to age or medical condition  Airway Management Planned: Simple Face Mask  Additional Equipment:   Intra-op Plan:   Post-operative Plan: Extubation in OR  Informed Consent: I have reviewed the patients History and Physical, chart, labs and discussed the procedure including the risks, benefits and alternatives for the proposed anesthesia with the patient or authorized representative who has indicated his/her understanding and acceptance.     Dental advisory given  Plan Discussed with: CRNA, Anesthesiologist and Surgeon  Anesthesia Plan Comments:        Anesthesia Quick Evaluation

## 2022-11-11 NOTE — Progress Notes (Signed)
ABI study completed.  ° °Please see CV Proc for preliminary results.  ° °Javaeh Muscatello, RDMS, RVT ° °

## 2022-11-11 NOTE — Anesthesia Postprocedure Evaluation (Addendum)
Anesthesia Post Note  Patient: Micheal Morris  Procedure(s) Performed: RIGHT AMPUTATION  of  GREAT TOE (Right: Toe)     Patient location during evaluation: PACU Anesthesia Type: MAC Level of consciousness: awake and alert, patient cooperative and oriented Pain management: pain level controlled Vital Signs Assessment: post-procedure vital signs reviewed and stable Respiratory status: nonlabored ventilation, spontaneous breathing and respiratory function stable Cardiovascular status: stable and blood pressure returned to baseline Postop Assessment: no apparent nausea or vomiting and adequate PO intake Anesthetic complications: no   No notable events documented.  Last Vitals:  Vitals:   11/11/22 1930 11/11/22 1958  BP: (!) 144/71 129/66  Pulse: 86 88  Resp: 16 16  Temp: 36.7 C 36.9 C  SpO2: 98% 99%    Last Pain:  Vitals:   11/11/22 1958  TempSrc: Oral  PainSc:                  Samanvitha Germany,E. Korbyn Vanes

## 2022-11-11 NOTE — Plan of Care (Signed)

## 2022-11-11 NOTE — Treatment Plan (Signed)
MRI has been reviewed.  ABIs are pending should not hold this up for surgery.  Discussed results of MRI with patient and his wife.  Discussed at least partial toe amputation is necessary, we will try to save as much as possible but this depends on the soft tissue envelope and full amputation of toe may be necessary.  Surgery scheduled for 4 PM today.  Continue NPO.

## 2022-11-12 ENCOUNTER — Encounter (HOSPITAL_COMMUNITY): Payer: Self-pay | Admitting: Podiatry

## 2022-11-12 ENCOUNTER — Other Ambulatory Visit: Payer: Self-pay | Admitting: Podiatry

## 2022-11-12 DIAGNOSIS — L97514 Non-pressure chronic ulcer of other part of right foot with necrosis of bone: Secondary | ICD-10-CM | POA: Diagnosis not present

## 2022-11-12 DIAGNOSIS — E11621 Type 2 diabetes mellitus with foot ulcer: Secondary | ICD-10-CM | POA: Diagnosis not present

## 2022-11-12 LAB — CULTURE, BLOOD (ROUTINE X 2): Culture: NO GROWTH

## 2022-11-12 LAB — BASIC METABOLIC PANEL
Anion gap: 12 (ref 5–15)
BUN: 30 mg/dL — ABNORMAL HIGH (ref 8–23)
CO2: 23 mmol/L (ref 22–32)
Calcium: 8.1 mg/dL — ABNORMAL LOW (ref 8.9–10.3)
Chloride: 97 mmol/L — ABNORMAL LOW (ref 98–111)
Creatinine, Ser: 1.86 mg/dL — ABNORMAL HIGH (ref 0.61–1.24)
GFR, Estimated: 37 mL/min — ABNORMAL LOW (ref >60)
Glucose, Bld: 309 mg/dL — ABNORMAL HIGH (ref 70–99)
Potassium: 4.3 mmol/L (ref 3.5–5.1)
Sodium: 132 mmol/L — ABNORMAL LOW (ref 135–145)

## 2022-11-12 LAB — CBC
HCT: 37.3 % — ABNORMAL LOW (ref 39.0–52.0)
Hemoglobin: 12.1 g/dL — ABNORMAL LOW (ref 13.0–17.0)
MCH: 25.6 pg — ABNORMAL LOW (ref 26.0–34.0)
MCHC: 32.4 g/dL (ref 30.0–36.0)
MCV: 78.9 fL — ABNORMAL LOW (ref 80.0–100.0)
Platelets: 360 10*3/uL (ref 150–400)
RBC: 4.73 MIL/uL (ref 4.22–5.81)
RDW: 14.5 % (ref 11.5–15.5)
WBC: 15 10*3/uL — ABNORMAL HIGH (ref 4.0–10.5)
nRBC: 0 % (ref 0.0–0.2)

## 2022-11-12 LAB — GLUCOSE, CAPILLARY
Glucose-Capillary: 144 mg/dL — ABNORMAL HIGH (ref 70–99)
Glucose-Capillary: 190 mg/dL — ABNORMAL HIGH (ref 70–99)
Glucose-Capillary: 234 mg/dL — ABNORMAL HIGH (ref 70–99)
Glucose-Capillary: 257 mg/dL — ABNORMAL HIGH (ref 70–99)
Glucose-Capillary: 322 mg/dL — ABNORMAL HIGH (ref 70–99)
Glucose-Capillary: 330 mg/dL — ABNORMAL HIGH (ref 70–99)
Glucose-Capillary: 330 mg/dL — ABNORMAL HIGH (ref 70–99)

## 2022-11-12 MED ORDER — EMPAGLIFLOZIN 25 MG PO TABS
25.0000 mg | ORAL_TABLET | Freq: Every day | ORAL | Status: DC
  Administered 2022-11-12 – 2022-11-13 (×2): 25 mg via ORAL
  Filled 2022-11-12 (×3): qty 1

## 2022-11-12 MED ORDER — INSULIN ASPART 100 UNIT/ML IJ SOLN
40.0000 [IU] | Freq: Once | INTRAMUSCULAR | Status: AC
  Administered 2022-11-12: 40 [IU] via SUBCUTANEOUS

## 2022-11-12 MED ORDER — INSULIN GLARGINE-YFGN 100 UNIT/ML ~~LOC~~ SOLN
90.0000 [IU] | Freq: Every day | SUBCUTANEOUS | Status: DC
  Administered 2022-11-13: 90 [IU] via SUBCUTANEOUS
  Filled 2022-11-12: qty 0.9

## 2022-11-12 MED ORDER — INSULIN ASPART 100 UNIT/ML IJ SOLN
0.0000 [IU] | Freq: Three times a day (TID) | INTRAMUSCULAR | Status: DC
  Administered 2022-11-12: 15 [IU] via SUBCUTANEOUS
  Administered 2022-11-13: 11 [IU] via SUBCUTANEOUS

## 2022-11-12 MED ORDER — INSULIN ASPART 100 UNIT/ML IJ SOLN: 50.0000 [IU] | Freq: Once | INTRAMUSCULAR | Status: DC

## 2022-11-12 MED ORDER — INSULIN ASPART 100 UNIT/ML IJ SOLN
0.0000 [IU] | Freq: Three times a day (TID) | INTRAMUSCULAR | Status: DC
  Administered 2022-11-12: 11 [IU] via SUBCUTANEOUS

## 2022-11-12 MED ORDER — LIRAGLUTIDE 18 MG/3ML ~~LOC~~ SOPN: 1.8000 mg | PEN_INJECTOR | Freq: Every day | SUBCUTANEOUS | Status: DC

## 2022-11-12 MED ORDER — INSULIN GLARGINE-YFGN 100 UNIT/ML ~~LOC~~ SOLN
50.0000 [IU] | Freq: Every day | SUBCUTANEOUS | Status: DC
  Administered 2022-11-12: 50 [IU] via SUBCUTANEOUS
  Filled 2022-11-12: qty 0.5

## 2022-11-12 MED ORDER — GABAPENTIN 300 MG PO CAPS
300.0000 mg | ORAL_CAPSULE | Freq: Two times a day (BID) | ORAL | Status: DC
  Administered 2022-11-12 – 2022-11-13 (×2): 300 mg via ORAL
  Filled 2022-11-12 (×2): qty 1

## 2022-11-12 MED ORDER — INSULIN ASPART 100 UNIT/ML IJ SOLN: 0.0000 [IU] | Freq: Every day | INTRAMUSCULAR | Status: DC

## 2022-11-12 MED ORDER — ASPIRIN 81 MG PO TBEC
81.0000 mg | DELAYED_RELEASE_TABLET | Freq: Every day | ORAL | Status: DC
  Administered 2022-11-13: 81 mg via ORAL
  Filled 2022-11-12: qty 1

## 2022-11-12 NOTE — Op Note (Addendum)
Patient Name: Micheal Morris DOB: 1946/02/19  MRN: [id number]   Date of Service: 11/10/2022 - 11/11/2022  Surgeon: Dr. Sharl Ma, DPM Assistants: None Pre-operative Diagnosis:  DIABETES INFECTION Post-operative Diagnosis:  DIABETES INFECTION Procedures: Procedures:   * RIGHT AMPUTATION  of  GREAT TOE Pathology/Specimens: ID Type Source Tests Collected by Time Destination  1 : RIGHT GREAT TOE Tissue PATH Digit amputation SURGICAL PATHOLOGY Edwin Cap, DPM 11/11/2022 1844   A : RIGHT GREAT TOE Tissue Bone AEROBIC/ANAEROBIC CULTURE W GRAM STAIN (SURGICAL/DEEP WOUND), ANAEROBIC CULTURE W GRAM STAIN (Canceled) Edwin Cap, [facility] 11/11/2022 1846    Anesthesia: Local with MAC Hemostasis: No tourniquet was used Estimated Blood Loss: 10 cc Materials: * No implants in log * Medications: 10 cc 0.25% Marcaine plain Complications: No complications noted  Indications for Procedure:  This is a 77 y.o. 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  Acutely worsened this week and after presenting to the clinic with significant cellulitis and diabetic foot infection admission was recommended.  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  Amputation was recommended.   Procedure in Detail: Patient was identified in pre-operative holding area. Formal consent was signed and the right lower extremity was marked. Patient was brought back to the operating room. Anesthesia was induced. The extremity was prepped and draped in the usual sterile fashion. Timeout was taken to confirm patient name, laterality, and procedure prior to incision.   Attention was then directed to the right foot where a racquet style incision was created around the right hallux proximal to the area of necrosis.  The incision was carried deep through subcutaneous tissue directly to bone.  Due to the amount of necrosis of the soft tissue this necessitated  complete amputation of the toe.  The collateral ligaments of the metatarsal phalangeal joint were released and the toe and soft tissue attachments were sent for pathology.  A tissue culture of the distal phalanx was taken and sent for bone culture.  The wound was thoroughly irrigated and hemostasis was achieved.  Perfusion was excellent.  The wound was closed in layers with 3-0 Monocryl, 3-0 nylon and skin staples.  The foot was then dressed with Xeroform dry sterile dressings and an Ace wrap under light compression. Patient tolerated the procedure well.   Disposition: Following a period of post-operative monitoring, patient will be transferred to the floor he may be WBAT in a postoperative shoe.  Expect he will be discharged on 10 to 14 days of oral antibiotics.Marland Kitchen

## 2022-11-12 NOTE — Evaluation (Signed)
Physical Therapy Evaluation Patient Details Name: Micheal Morris MRN: [id number] DOB: 1946/07/18 Today's Date: 11/12/2022  History of Present Illness  77yo admitted 4/11 with worsening R Achilles pain and was found to have worsening of his right hallux infection. Underwent right great toe amputation 4/12.   PMH:  DM2, HTN, HLD, CKD, and a chronic right hallux diabetic ulcer infection  Clinical Impression  Pt admitted with above diagnosis. Pt able to ambulate with RW with overall good safety awareness with wife present and she can assist pt prn at home. Recommend HHPT safety eval given that pt does have multiple stairs and while PT simulated this, may benefit from feedback in home environment. Educated with pt and wife complete and issued gait belt.  Will follow pt while in hospital. Pt should progress well.   Pt currently with functional limitations due to the deficits listed below (see PT Problem List). Pt will benefit from acute skilled PT to increase their independence and safety with mobility to allow discharge.          Recommendations for follow up therapy are one component of a multi-disciplinary discharge planning process, led by the attending physician.  Recommendations may be updated based on patient status, additional functional criteria and insurance authorization.  Follow Up Recommendations       Assistance Recommended at Discharge PRN  Patient can return home with the following  Assistance with cooking/housework;Assist for transportation;Help with stairs or ramp for entrance    Equipment Recommendations None recommended by PT  Recommendations for Other Services       Functional Status Assessment Patient has had a recent decline in their functional status and demonstrates the ability to make significant improvements in function in a reasonable and predictable amount of time.     Precautions / Restrictions Precautions Precautions: None Required Braces or Orthoses: Other  Brace Other Brace: cast shoe right foot Restrictions Weight Bearing Restrictions: Yes RLE Weight Bearing: Weight bearing as tolerated Other Position/Activity Restrictions: Heel weight bearing      Mobility  Bed Mobility Overal bed mobility: Independent                  Transfers Overall transfer level: Independent                      Ambulation/Gait Ambulation/Gait assistance: Min guard Gait Distance (Feet): 250 Feet Assistive device: Rolling walker (2 wheels), Straight cane Gait Pattern/deviations: Step-to pattern, Decreased step length - left, Decreased stance time - right, Decreased weight shift to right, Antalgic   Gait velocity interpretation: 1.31 - 2.62 ft/sec, indicative of limited community ambulator   General Gait Details: Inititally pt wanted to use his cane however PT instructed pt that it was too difficult and unsafe for pt to use cane and maintain right heel weight bearing. Pt reluctantly agreed to use RW and has one at home he can use. Pt needed cues to stay inside RW as well as cues for sequencing steps and RW. Overall pt is safe with RW and wife present to be educated.  Gave pt/wife gait belt.  Stairs Stairs: Yes Stairs assistance: Min guard Stair Management: One rail Left, Step to pattern, Forwards, With cane Number of Stairs: 12 General stair comments: Pt able to ascend and descend stairs with cues for technique.  Wife observed and understands how to assist pt on stairs.  Wheelchair Mobility    Modified Rankin (Stroke Patients Only)       Balance Overall balance  assessment: Needs assistance Sitting-balance support: No upper extremity supported, Feet supported Sitting balance-Leahy Scale: Good     Standing balance support: Bilateral upper extremity supported, Single extremity supported, During functional activity Standing balance-Leahy Scale: Poor Standing balance comment: relies on at least 1 UE support                              Pertinent Vitals/Pain Pain Assessment Pain Assessment: Faces Faces Pain Scale: Hurts little more Pain Location: heel right foot Pain Descriptors / Indicators: Aching, Grimacing, Guarding Pain Intervention(s): Limited activity within patient's tolerance, Monitored during session, Repositioned, Premedicated before session    Home Living Family/patient expects to be discharged to:: Private residence Living Arrangements: Spouse/significant other Available Help at Discharge: Family;Available 24 hours/day Type of Home: House Home Access: Stairs to enter Entrance Stairs-Rails: None Entrance Stairs-Number of Steps: 2 Alternate Level Stairs-Number of Steps: flight Home Layout: Two level;1/2 bath on main level Home Equipment: Shower seat - built in;Grab bars - tub/shower;Grab bars - toilet;Hand held Programmer, systems (2 wheels);Cane - single point      Prior Function Prior Level of Function : Independent/Modified Independent;Driving                     Hand Dominance   Dominant Hand: Right    Extremity/Trunk Assessment   Upper Extremity Assessment Upper Extremity Assessment: Defer to OT evaluation    Lower Extremity Assessment Lower Extremity Assessment: RLE deficits/detail RLE: Unable to fully assess due to pain    Cervical / Trunk Assessment Cervical / Trunk Assessment: Kyphotic  Communication   Communication: No difficulties  Cognition Arousal/Alertness: Awake/alert Behavior During Therapy: WFL for tasks assessed/performed Overall Cognitive Status: Within Functional Limits for tasks assessed                                          General Comments      Exercises General Exercises - Lower Extremity Ankle Circles/Pumps: AROM, Both, 10 reps, Supine Quad Sets: AROM, Both, 5 reps, Supine Heel Slides: AROM, Both, 5 reps, Supine Hip ABduction/ADduction: AROM, Both, 5 reps, Supine   Assessment/Plan    PT Assessment Patient  needs continued PT services  PT Problem List Decreased activity tolerance;Decreased balance;Decreased mobility;Decreased knowledge of use of DME;Decreased safety awareness;Decreased knowledge of precautions       PT Treatment Interventions DME instruction;Gait training;Functional mobility training;Therapeutic activities;Therapeutic exercise;Balance training;Patient/family education;Stair training    PT Goals (Current goals can be found in the Care Plan section)  Acute Rehab PT Goals Patient Stated Goal: to go home PT Goal Formulation: With patient Time For Goal Achievement: 11/26/22 Potential to Achieve Goals: Good    Frequency Min 3X/week     Co-evaluation               AM-PAC PT "6 Clicks" Mobility  Outcome Measure Help needed turning from your back to your side while in a flat bed without using bedrails?: None Help needed moving from lying on your back to sitting on the side of a flat bed without using bedrails?: None Help needed moving to and from a bed to a chair (including a wheelchair)?: A Little Help needed standing up from a chair using your arms (e.g., wheelchair or bedside chair)?: A Little Help needed to walk in hospital room?: A Little Help needed climbing 3-5  steps with a railing? : A Little 6 Click Score: 20    End of Session Equipment Utilized During Treatment: Gait belt Activity Tolerance: Patient tolerated treatment well Patient left: in chair;with call bell/phone within reach;with family/visitor present Nurse Communication: Mobility status PT Visit Diagnosis: Muscle weakness (generalized) (M62.81)    Time: 1610-9604 PT Time Calculation (min) (ACUTE ONLY): 23 min   Charges:   PT Evaluation $PT Eval Low Complexity: 1 Low PT Treatments $Gait Training: 8-22 mins        Carmin Alvidrez M,PT Acute Rehab Services [phone]   Bevelyn Buckles 11/12/2022, 2:17 PM

## 2022-11-12 NOTE — Progress Notes (Signed)
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 Podiatry suggested an MRI and ABIs with empiric broad-spectrum antibiotics coverage, and asked [facility] to arrange for a direct admission.   Consultants:  Podiatry  Goals of Care:  Code Status: Full Code   DVT prophylaxis: SCDs  Interim Hx: Underwent amputation of right great toe in OR 4/12.  He has been afebrile with stable vital signs postoperatively.  CBG was mildly elevated last night but is improving this morning.  Having some difficulty with balance postoperatively which was actually present before him but has been slightly worsened by loss of his great toe.  Otherwise no complaints.  Denies chest pain shortness of breath fevers or chills.  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 - s/p amputation of the R great toe in the OR per Podiatry 11/11/22 - wound care per Podiatry -anticipate transition to oral antibiotics and discharged home 4/14   HTN Blood pressure presently well-controlled   HLD Continue usual home meds    CKD IIIb Most recent crt on record is 1.86 in 2022 w/ GFR 38 - creatinine presently stable  Recent Labs  Lab 11/10/22 1752 11/12/22 0138  CREATININE 1.78* 1.86*    DM2 Strict control indicated - CBG now quite elevated postoperatively with resumption of diet  -resume long-acting insulin with rapid titration back to his home dosing - A1c 7.8 reflecting decent long-term control - anticipate return to usual prior outpatient regimen at time of discharge   Obesity - Body mass index is 40.46 kg/m.  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 rate 15, height 5\' 10"  (1.778 m), weight 127.4 kg, SpO2 95 %.  Intake/Output Summary (Last 24 hours) at 11/12/2022 0945 Last data filed at 11/12/2022 0900 Gross per 24 hour  Intake 1369.8 ml  Output 700 ml  Net 669.8 ml    Filed Weights   11/11/22 1454 11/12/22 0332  Weight: 123.2 kg 127.4 kg    Examination: General: No acute respiratory distress Lungs: Clear to auscultation bilaterally without wheezes or crackles Cardiovascular: Regular rate and rhythm without murmur gallop or rub normal S1 and S2 Abdomen: Nontender, nondistended, soft, bowel sounds positive, no rebound, no ascites, no appreciable mass Extremities: Trace bilateral lower extremity edema -right foot dressed in surgical dressing and dry   CBC: Recent Labs  Lab 11/10/22 1752 11/12/22 0138  WBC 16.2* 15.0*  HGB 13.5 12.1*  HCT 42.4 37.3*  MCV 79.7* 78.9*  PLT 390 360    Basic Metabolic Panel: Recent Labs  Lab 11/10/22 1752 11/12/22  0138  NA 136 132*  K 4.5 4.3  CL 96* 97*  CO2 25 23  GLUCOSE 117* 309*  BUN 28* 30*  CREATININE 1.78* 1.86*  CALCIUM 8.6* 8.1*    GFR: Estimated Creatinine Clearance: 46 mL/min (A) (by C-G formula based on SCr of 1.86 mg/dL (H)).   Scheduled Meds:  (feeding supplement) PROSource Plus  30 mL Oral TID BM   amLODipine  10 mg Oral Daily   And    benazepril  20 mg Oral Daily   brimonidine  1 drop Left Eye BID   And   timolol  1 drop Left Eye BID   insulin aspart  0-15 Units Subcutaneous Q4H   linaclotide  290 mcg Oral QAC breakfast   loteprednol  1 drop Both Eyes TID   metoprolol succinate  100 mg Oral Daily   metroNIDAZOLE  500 mg Oral Q12H   multivitamin with minerals  1 tablet Oral Daily   nutrition supplement (JUVEN)  1 packet Oral BID BM   pantoprazole  40 mg Oral Daily   simvastatin  20 mg Oral q1800   spironolactone  25 mg Oral Daily   torsemide  20 mg Oral Daily   Continuous Infusions:  sodium chloride Stopped (11/11/22 1618)   cefTRIAXone (ROCEPHIN)  IV 2 g (11/11/22 1618)     LOS: 2 days   Lonia Blood, MD [facility] Office  [phone] Pager - Text Page per Loretha Stapler  If 7PM-7AM, please contact night-coverage per Amion 11/12/2022, 9:45 AM

## 2022-11-12 NOTE — Evaluation (Signed)
Occupational Therapy Evaluation Patient Details Name: Micheal Morris MRN: [id number] DOB: October 24, 1945 Today's Date: 11/12/2022   History of Present Illness 77yo admitted 4/11 with worsening R Achilles pain and was found to have worsening of his right hallux infection. Underwent right great toe amputation 4/12.   PMH:  DM2, HTN, HLD, CKD, and a chronic right hallux diabetic ulcer infection   Clinical Impression   Pt s/p R great toe amputation 4/12. Pt doing well, motivated to participate, minimal pain during functional activities. Pt able to complete ADLs with mod I at near [facility] but has slower gait and slightly decreased BLE strength. Pt wife is able to assist as needed at home. Pt stated he had a standard walker and may benefit from a RW to improve mobility. Pt cleared from OT, no follow up needed.      Recommendations for follow up therapy are one component of a multi-disciplinary discharge planning process, led by the attending physician.  Recommendations may be updated based on patient status, additional functional criteria and insurance authorization.   Assistance Recommended at Discharge PRN  Patient can return home with the following Assistance with cooking/housework;Assist for transportation;Help with stairs or ramp for entrance    Functional Status Assessment  Patient has had a recent decline in their functional status and demonstrates the ability to make significant improvements in function in a reasonable and predictable amount of time.  Equipment Recommendations  Other (comment) (Pt would benefit from RW)    Recommendations for Other Services       Precautions / Restrictions Precautions Precautions: None Required Braces or Orthoses: Other Brace Other Brace: cast shoe right foot Restrictions Weight Bearing Restrictions: Yes RLE Weight Bearing: Weight bearing as tolerated Other Position/Activity Restrictions: Heel weight bearing      Mobility Bed Mobility Overal bed  mobility: Independent                  Transfers Overall transfer level: Modified independent Equipment used: Rolling walker (2 wheels)               General transfer comment: used RW for support      Balance Overall balance assessment: Needs assistance Sitting-balance support: No upper extremity supported, Feet supported Sitting balance-Leahy Scale: Good     Standing balance support: Single extremity supported, During functional activity Standing balance-Leahy Scale: Fair Standing balance comment: able to take small steps without support, no LOB, but unable to tolerate more than mild challenge                           ADL either performed or assessed with clinical judgement   ADL Overall ADL's : Modified independent                                             Vision         Perception     Praxis      Pertinent Vitals/Pain Pain Assessment Pain Assessment: Faces Faces Pain Scale: Hurts a little bit Pain Location: heel right foot Pain Descriptors / Indicators: Aching, Grimacing, Guarding Pain Intervention(s): Monitored during session     Hand Dominance Right   Extremity/Trunk Assessment Upper Extremity Assessment Upper Extremity Assessment: Overall WFL for tasks assessed   Lower Extremity Assessment Lower Extremity Assessment: Defer to PT evaluation RLE: Unable to fully assess  due to pain   Cervical / Trunk Assessment Cervical / Trunk Assessment: Kyphotic   Communication Communication Communication: No difficulties   Cognition Arousal/Alertness: Awake/alert Behavior During Therapy: WFL for tasks assessed/performed Overall Cognitive Status: Within Functional Limits for tasks assessed                                       General Comments       Exercises     Shoulder Instructions      Home Living Family/patient expects to be discharged to:: Private residence Living Arrangements:  Spouse/significant other Available Help at Discharge: Family;Available 24 hours/day Type of Home: House Home Access: Stairs to enter [employer] of Steps: 2 Entrance Stairs-Rails: None Home Layout: Two level;1/2 bath on main level Alternate Level Stairs-Number of Steps: flight Alternate Level Stairs-Rails: Left Bathroom Shower/Tub: Producer, television/film/video: Handicapped height     Home Equipment: Shower seat - built in;Grab bars - tub/shower;Grab bars - toilet;Hand held shower head;Cane - single point;Standard Environmental consultant   Additional Comments: Pt has standard walker, requested RW      Prior Functioning/Environment Prior Level of Function : Independent/Modified Independent;Driving                        OT Problem List: Decreased activity tolerance;Impaired balance (sitting and/or standing);Pain      OT Treatment/Interventions:      OT Goals(Current goals can be found in the care plan section) Acute Rehab OT Goals Patient Stated Goal: to return home at [facility] OT Goal Formulation: With patient Time For Goal Achievement: 11/26/22 Potential to Achieve Goals: Good  OT Frequency:      Co-evaluation              AM-PAC OT "6 Clicks" Daily Activity     Outcome Measure Help from another person eating meals?: None Help from another person taking care of personal grooming?: None Help from another person toileting, which includes using toliet, bedpan, or urinal?: None Help from another person bathing (including washing, rinsing, drying)?: None Help from another person to put on and taking off regular upper body clothing?: None Help from another person to put on and taking off regular lower body clothing?: None 6 Click Score: 24   End of Session Equipment Utilized During Treatment: Rolling walker (2 wheels);Gait belt Nurse Communication: Mobility status  Activity Tolerance: Patient tolerated treatment well Patient left: in bed;with call bell/phone  within reach;with family/visitor present  OT Visit Diagnosis: Unsteadiness on feet (R26.81);Pain;Other abnormalities of gait and mobility (R26.89) Pain - Right/Left: Right Pain - part of body: Ankle and joints of foot                Time: 1610-9604 OT Time Calculation (min): 25 min Charges:  OT General Charges $OT Visit: 1 Visit OT Evaluation $OT Eval Low Complexity: 1 Low OT Treatments $Self Care/Home Management : 8-22 mins  [place], OTR/L   Alexis Goodell 11/12/2022, 5:39 PM

## 2022-11-12 NOTE — Plan of Care (Signed)
  Problem: Education: Goal: Knowledge of General Education information will improve Description: Including pain rating scale, medication(s)/side effects and non-pharmacologic comfort measures Outcome: Progressing   Problem: Health Behavior/Discharge Planning: Goal: Ability to manage health-related needs will improve Outcome: Progressing   Problem: Clinical Measurements: Goal: Ability to maintain clinical measurements within normal limits will improve Outcome: Progressing Goal: Cardiovascular complication will be avoided Outcome: Progressing   Problem: Activity: Goal: Risk for activity intolerance will decrease Outcome: Progressing   Problem: Nutrition: Goal: Adequate nutrition will be maintained Outcome: Progressing   Problem: Coping: Goal: Level of anxiety will decrease Outcome: Progressing   Problem: Elimination: Goal: Will not experience complications related to bowel motility Outcome: Progressing Goal: Will not experience complications related to urinary retention Outcome: Progressing   Problem: Pain Managment: Goal: General experience of comfort will improve Outcome: Progressing   Problem: Safety: Goal: Ability to remain free from injury will improve Outcome: Progressing   

## 2022-11-12 NOTE — Progress Notes (Signed)
Ortho tech called for post op shoe placement

## 2022-11-12 NOTE — Progress Notes (Signed)
Subjective:  Patient ID: Micheal Morris, male    DOB: Dec 28, 1945,  MRN: [id number]  Patient seen at bedside POD #1 status post right great toe amputation.  He says he is feeling okay not having any pain.  He did try on the postop shoe with says it fits well but he is having difficulty ambulating and walking with balance issues which were some pre-existing and has concerned about balance with the big toe missing nail.  Otherwise doing okay.  His wife is at bedside.  Negative for chest pain and shortness of breath Fever: no Night sweats: no Constitutional signs: no Review of all other systems is negative Objective:   Vitals:   11/12/22 0345 11/12/22 0832  BP: 125/64 135/76  Pulse: 82 86  Resp:  15  Temp: 98.3 F (36.8 C) 98.7 F (37.1 C)  SpO2: 98% 95%   General AA&O x3. Normal mood and affect.  Vascular Foot is warm and well-perfused, capillary fill time intact to remaining digits  Neurologic Epicritic sensation grossly absent.  Dermatologic Dressing clean dry and intact without strikethrough  Orthopedic: MMT 5/5 in dorsiflexion, plantarflexion, inversion, and eversion. Normal joint ROM without pain or crepitus.   Recent Results (from the past 240 hour(s))  WOUND CULTURE     Status: None (Preliminary result)   Collection Time: 11/10/22  8:20 AM   Specimen: Ulcer; Wound  Result Value Ref Range Status   MICRO NUMBER: [id number]  Preliminary   SPECIMEN QUALITY: Adequate  Preliminary   SOURCE: WOUND (SITE NOT SPECIFIED)  Preliminary   STATUS: PRELIMINARY  Preliminary   GRAM STAIN: Gram-positive cocci in chains  Preliminary    Comment: No white blood cells seen No epithelial cells seen Many Gram positive cocci in chains Moderate Gram positive bacilli  Culture, blood (Routine X 2) w Reflex to ID Panel     Status: None (Preliminary result)   Collection Time: 11/10/22  5:52 PM   Specimen: BLOOD  Result Value Ref Range Status   Specimen Description BLOOD SITE NOT SPECIFIED  Final    Special Requests   Final    BOTTLES DRAWN AEROBIC ONLY Blood Culture results may not be optimal due to an excessive volume of blood received in culture bottles   Culture   Final    NO GROWTH 2 DAYS Performed at [facility], [address] [place], [place] [address]    Report Status PENDING  Incomplete  Culture, blood (Routine X 2) w Reflex to ID Panel     Status: None (Preliminary result)   Collection Time: 11/10/22  5:54 PM   Specimen: BLOOD  Result Value Ref Range Status   Specimen Description BLOOD SITE NOT SPECIFIED  Final   Special Requests   Final    BOTTLES DRAWN AEROBIC ONLY Blood Culture adequate volume   Culture   Final    NO GROWTH 2 DAYS Performed at [facility], [address] [place], [place] [address]    Report Status PENDING  Incomplete  Aerobic/Anaerobic Culture w Gram Stain (surgical/deep wound)     Status: None (Preliminary result)   Collection Time: 11/11/22  6:46 PM   Specimen: Bone; Tissue  Result Value Ref Range Status   Specimen Description BONE RIGHT TOE  Final   Special Requests RT GREAT TOE  Final   Gram Stain   Final    NO WBC SEEN NO ORGANISMS SEEN Performed at [facility], [address], [place], [place] [address]  Culture PENDING  Incomplete   Report Status PENDING  Incomplete     Assessment & Plan:  Patient was evaluated and treated and all questions answered.  POD #1 right great toe amputation -He may be WBAT to the heel in the flat postop shoe.  PT evaluation ordered, he feels like he may be having some balance issues. -Expect surgical cure of infection.  Preop wound culture showing group B strep.,  No growth to date on tissue culture from yesterday -Expect 10 days Augmentin at discharge should be sufficient, I will change based on further culture data -No further surgical plans -No dressing changes until follow-up in office next week -I will have my office contact the patient on Monday to schedule a follow-up  for Wednesday or Thursday with me or my nurse -Expect he will be stable for discharge tomorrow if PT is able to see today or tomorrow morning first thing and he has no deficits or home needs based on this evaluation  Edwin Cap, DPM  Accessible via secure chat for questions or concerns.

## 2022-11-13 DIAGNOSIS — E11621 Type 2 diabetes mellitus with foot ulcer: Secondary | ICD-10-CM | POA: Diagnosis not present

## 2022-11-13 DIAGNOSIS — L97514 Non-pressure chronic ulcer of other part of right foot with necrosis of bone: Secondary | ICD-10-CM | POA: Diagnosis not present

## 2022-11-13 LAB — BASIC METABOLIC PANEL
Anion gap: 9 (ref 5–15)
Anion gap: 13 (ref 5–15)
BUN: 45 mg/dL — ABNORMAL HIGH (ref 8–23)
BUN: 45 mg/dL — ABNORMAL HIGH (ref 8–23)
CO2: 25 mmol/L (ref 22–32)
CO2: 20 mmol/L — ABNORMAL LOW (ref 22–32)
Calcium: 8 mg/dL — ABNORMAL LOW (ref 8.9–10.3)
Calcium: 8.5 mg/dL — ABNORMAL LOW (ref 8.9–10.3)
Chloride: 101 mmol/L (ref 98–111)
Chloride: 102 mmol/L (ref 98–111)
Creatinine, Ser: 1.94 mg/dL — ABNORMAL HIGH (ref 0.61–1.24)
Creatinine, Ser: 1.81 mg/dL — ABNORMAL HIGH (ref 0.61–1.24)
GFR, Estimated: 35 mL/min — ABNORMAL LOW (ref >60)
GFR, Estimated: 39 mL/min — ABNORMAL LOW (ref >60)
Glucose, Bld: 122 mg/dL — ABNORMAL HIGH (ref 70–99)
Glucose, Bld: 252 mg/dL — ABNORMAL HIGH (ref 70–99)
Potassium: 3.6 mmol/L (ref 3.5–5.1)
Potassium: 4.7 mmol/L (ref 3.5–5.1)
Sodium: 135 mmol/L (ref 135–145)
Sodium: 135 mmol/L (ref 135–145)

## 2022-11-13 LAB — WOUND CULTURE: MICRO NUMBER:: 14812619

## 2022-11-13 LAB — GLUCOSE, CAPILLARY
Glucose-Capillary: 120 mg/dL — ABNORMAL HIGH (ref 70–99)
Glucose-Capillary: 258 mg/dL — ABNORMAL HIGH (ref 70–99)
Glucose-Capillary: 210 mg/dL — ABNORMAL HIGH (ref 70–99)

## 2022-11-13 LAB — CULTURE, BLOOD (ROUTINE X 2)

## 2022-11-13 LAB — CBC
HCT: 35.6 % — ABNORMAL LOW (ref 39.0–52.0)
Hemoglobin: 11.5 g/dL — ABNORMAL LOW (ref 13.0–17.0)
MCH: 25.6 pg — ABNORMAL LOW (ref 26.0–34.0)
MCHC: 32.3 g/dL (ref 30.0–36.0)
MCV: 79.1 fL — ABNORMAL LOW (ref 80.0–100.0)
Platelets: 353 10*3/uL (ref 150–400)
RBC: 4.5 MIL/uL (ref 4.22–5.81)
RDW: 14.6 % (ref 11.5–15.5)
WBC: 15.1 10*3/uL — ABNORMAL HIGH (ref 4.0–10.5)
nRBC: 0 % (ref 0.0–0.2)

## 2022-11-13 MED ORDER — JUVEN PO PACK: 1.0000 | PACK | Freq: Two times a day (BID) | ORAL | 1 refills | Status: DC

## 2022-11-13 MED ORDER — ACETAMINOPHEN 325 MG PO TABS: 650.0000 mg | ORAL_TABLET | Freq: Four times a day (QID) | ORAL | Status: AC | PRN

## 2022-11-13 MED ORDER — SODIUM CHLORIDE 0.9 % IV BOLUS
500.0000 mL | Freq: Once | INTRAVENOUS | Status: AC
  Administered 2022-11-13: 500 mL via INTRAVENOUS

## 2022-11-13 MED ORDER — LEVOFLOXACIN 500 MG PO TABS: 750.0000 mg | ORAL_TABLET | Freq: Every day | ORAL | Status: DC

## 2022-11-13 MED ORDER — LEVOFLOXACIN 750 MG PO TABS: 750.0000 mg | ORAL_TABLET | Freq: Every day | ORAL | 0 refills | Status: DC

## 2022-11-13 MED ORDER — PROSOURCE PLUS PO LIQD: 30.0000 mL | Freq: Three times a day (TID) | ORAL | 1 refills | Status: AC

## 2022-11-13 MED ORDER — AMOXICILLIN-POT CLAVULANATE 875-125 MG PO TABS
1.0000 | ORAL_TABLET | Freq: Two times a day (BID) | ORAL | Status: DC
  Administered 2022-11-13: 1 via ORAL
  Filled 2022-11-13: qty 1

## 2022-11-13 NOTE — Discharge Summary (Signed)
DISCHARGE SUMMARY  Micheal Morris  MR#: [id number]  DOB:03-Feb-1946  Date of Admission: 11/10/2022 Date of Discharge: 11/13/2022  Attending Physician:Kenzleigh Sedam Silvestre Gunner, MD  Patient's WUJ:[id number], Micheal Memos, MD  Consults: Podiatry   Disposition: D/C home   Follow-up Appts:  Follow-up Information     Eartha Inch, MD Follow up in 1 week(s).   Specialty: Family Medicine Contact information: [address] [place] [address] [phone]         Edwin Cap, DPM Follow up.   Specialty: Podiatry Why: The office will call you to arrange for a follow up visit in the clinic this week. Contact information: [address] 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 Podiatry suggested an MRI and ABIs with empiric broad-spectrum antibiotics coverage, and asked Micheal Morris to arrange for a direct admission.   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    Allergies as of 11/13/2022       Reactions   Nsaids Other (See Comments)   GI BLEED   Aspirin Other (See Comments)   STOMACH ULCER  STOMACH ULCER  STOMACH ULCER  STOMACH ULCER  STOMACH ULCER  STOMACH ULCER  STOMACH ULCER    Other Other (See Comments)   blisters        Medication List     STOP taking these medications    metoprolol tartrate 100 MG tablet Commonly known as: LOPRESSOR       TAKE these medications    Accu-Chek Guide w/Device Kit   acetaminophen 325 MG tablet Commonly known as: TYLENOL Take 2 tablets (650 mg total) by  mouth every 6 (six) hours as needed for mild pain, fever or headache.   amLODipine-benazepril 10-20 MG capsule Commonly known as: LOTREL Take 1 capsule by mouth daily.   aspirin 81 MG chewable tablet Chew 81 mg by mouth daily.   BD Pen Needle Nano U/F 32G X 4 MM Misc Generic drug: Insulin Pen Needle USE AS DIRECTED 4 TIMES A DAY   bimatoprost 0.01 % Soln Commonly known as: LUMIGAN Place 1 drop into both eyes at bedtime.   Combigan 0.2-0.5 % ophthalmic solution Generic drug: brimonidine-timolol Place 1 drop into both eyes 2 (two) times daily.   docusate sodium 100 MG capsule Commonly known as: COLACE Take  100 mg by mouth 2 (two) times daily.   gabapentin 300 MG capsule Commonly known as: NEURONTIN Take 300 mg by mouth 2 (two) times daily.   HumaLOG KwikPen 100 UNIT/ML KwikPen Generic drug: insulin lispro Inject 0-60 Units into the skin 3 (three) times daily. Per sliding scale three times daily with meals   Jardiance 25 MG Tabs tablet Generic drug: empagliflozin Take 25 mg by mouth daily.   Lantus SoloStar 100 UNIT/ML Solostar Pen Generic drug: insulin glargine Inject 90 Units into the skin daily.   levofloxacin 750 MG tablet Commonly known as: LEVAQUIN Take 1 tablet (750 mg total) by mouth daily. Start taking on: November 14, 2022   Linzess 290 MCG Caps capsule Generic drug: linaclotide Take 290 mcg by mouth daily before breakfast.   Lotemax 0.5 % Gel Generic drug: Loteprednol Etabonate Apply 1 drop to eye 3 (three) times daily.   metoprolol succinate 100 MG 24 hr tablet Commonly known as: TOPROL-XL Take 1 tablet by mouth every evening.   mirabegron ER 50 MG Tb24 tablet Commonly known as: MYRBETRIQ Take 50 mg by mouth daily.   multivitamin tablet Take 1 tablet by mouth daily. Centrum silver   ONE TOUCH ULTRA TEST test strip Generic drug: glucose blood   onetouch ultrasoft lancets USE AS INSTRUCTED   pantoprazole 40 MG tablet Commonly known as: PROTONIX Take 1 tablet by mouth daily.   [facility] Caps Take 1 capsule by mouth in the morning and at bedtime.   silver sulfADIAZINE 1 % cream Commonly known as: Silvadene Apply 1 Application topically daily.   simvastatin 20 MG tablet Commonly known as: ZOCOR Take 20 mg by mouth daily.   spironolactone 25 MG tablet Commonly known as: ALDACTONE Take 25 mg by mouth daily.   torsemide 20 MG tablet Commonly known as: DEMADEX Take 20 mg by mouth daily.   vardenafil 20 MG tablet Commonly known as: LEVITRA Take 20 mg by mouth daily as needed for erectile dysfunction.   Victoza 18 MG/3ML Sopn Generic  drug: liraglutide Inject 1.8 mg into the skin daily.        Day of Discharge BP 116/65 (BP Location: Right Arm)   Pulse 79   Temp 97.8 F (36.6 C) (Oral)   Resp 18   Ht 5\' 10"  (1.778 m)   Wt 127.4 kg   SpO2 100%   BMI 40.30 kg/m   Physical Exam: General: No acute respiratory distress Lungs: Clear to auscultation bilaterally without wheezes or crackles Cardiovascular: Regular rate and rhythm without murmur gallop or rub normal S1 and S2 Abdomen: Nontender, nondistended, soft, bowel sounds positive, no rebound, no ascites, no appreciable mass Extremities: No significant cyanosis, clubbing, or edema bilateral lower extremities - R foot dressed in operative dressing and undisturbed   Basic Metabolic Panel: Recent Labs  Lab 11/10/22 1752 11/12/22 0138 11/13/22 0132 11/13/22 1524  NA 136 132* 135 135  K 4.5 4.3 3.6 4.7  CL 96* 97* 101 102  CO2 20*  GLUCOSE 117* 309* 122* 252*  BUN 28* 30* 45* 45*  CREATININE 1.78* 1.86* 1.94* 1.81*  CALCIUM 8.6* 8.1* 8.0* 8.5*    CBC: Recent Labs  Lab 11/10/22 1752 11/12/22 0138 11/13/22 0132  WBC 16.2* 15.0* 15.1*  HGB 13.5 12.1* 11.5*  HCT 42.4 37.3* 35.6*  MCV 79.7* 78.9* 79.1*  PLT 390 360 353     Time spent in discharge (includes decision making & examination of pt): 35 minutes  11/13/2022, 4:35 PM   Lonia Blood, MD [facility] Office  [phone]

## 2022-11-14 LAB — AEROBIC/ANAEROBIC CULTURE W GRAM STAIN (SURGICAL/DEEP WOUND)

## 2022-11-15 LAB — CULTURE, BLOOD (ROUTINE X 2): Culture: NO GROWTH

## 2022-11-15 LAB — SURGICAL PATHOLOGY

## 2022-11-16 ENCOUNTER — Other Ambulatory Visit: Payer: Medicare PPO

## 2022-11-16 ENCOUNTER — Ambulatory Visit (INDEPENDENT_AMBULATORY_CARE_PROVIDER_SITE_OTHER): Payer: Medicare PPO | Admitting: Podiatry

## 2022-11-16 DIAGNOSIS — L97512 Non-pressure chronic ulcer of other part of right foot with fat layer exposed: Secondary | ICD-10-CM

## 2022-11-16 LAB — AEROBIC/ANAEROBIC CULTURE W GRAM STAIN (SURGICAL/DEEP WOUND)

## 2022-11-19 ENCOUNTER — Encounter: Payer: Self-pay | Admitting: Podiatry

## 2022-11-19 LAB — AEROBIC/ANAEROBIC CULTURE W GRAM STAIN (SURGICAL/DEEP WOUND): Gram Stain: NONE SEEN

## 2022-11-19 NOTE — Progress Notes (Signed)
  Subjective:  Patient ID: Micheal Morris, male    DOB: 26-Apr-1946,  MRN: [id number]  Chief Complaint  Patient presents with   Routine Post Op    POV #1 DOS 11/11/2022 for R great toe amputation      77 y.o. male returns for post-op check.  He is doing well not having much pain  Review of Systems: Negative except as noted in the HPI. Denies N/V/F/Ch.   Objective:  There were no vitals filed for this visit. There is no height or weight on file to calculate BMI. Constitutional Well developed. Well nourished.  Vascular Foot warm and well perfused. Capillary refill normal to all digits.  Calf is soft and supple, no posterior calf or knee pain, negative Homans' sign  Neurologic Normal speech. Oriented to person, place, and time. Epicritic sensation to light touch grossly present bilaterally.  Dermatologic Skin healing well without signs of infection. Skin edges well coapted without signs of infection.  Some maceration centrally  Orthopedic: He has mild edema, no tenderness to palpation noted about the surgical site.    Assessment:   1. Ulcer of great toe, right, with fat layer exposed    Plan:  Patient was evaluated and treated and all questions answered.  S/p foot surgery right -Progressing as expected post-operatively.  Foot was redressed.  No signs of active infection.  Some maceration noted.  May remove the bandage on Monday and begin to shower and redress every other day.  I will see him back in 2 weeks to remove the sutures and staples.  Continue WBAT in surgical shoe for now   Return in about 2 weeks (around 11/30/2022) for post op (no x-rays), staple/suture removal.

## 2022-11-20 LAB — AEROBIC/ANAEROBIC CULTURE W GRAM STAIN (SURGICAL/DEEP WOUND)

## 2022-11-30 ENCOUNTER — Ambulatory Visit (INDEPENDENT_AMBULATORY_CARE_PROVIDER_SITE_OTHER): Payer: Medicare PPO | Admitting: Podiatry

## 2022-11-30 DIAGNOSIS — T8189XA Other complications of procedures, not elsewhere classified, initial encounter: Secondary | ICD-10-CM | POA: Diagnosis not present

## 2022-11-30 DIAGNOSIS — M869 Osteomyelitis, unspecified: Secondary | ICD-10-CM | POA: Diagnosis not present

## 2022-11-30 MED ORDER — GENTAMICIN SULFATE 0.1 % EX OINT: 1.0000 | TOPICAL_OINTMENT | Freq: Every day | CUTANEOUS | 0 refills | Status: DC

## 2022-11-30 NOTE — Progress Notes (Signed)
  Subjective:  Patient ID: Micheal Morris, male    DOB: 1946-04-12,  MRN: [id number]  Chief Complaint  Patient presents with   Routine Post Op    staple/suture removal.POV #2 DOS 11/11/2022 for R great toe amputation PER Keely      77 y.o. male returns for post-op check.  Returns for follow-up they have noticed some drainage on the medial side of the incision, no increased edema or redness notes yellow color drainage  Review of Systems: Negative except as noted in the HPI. Denies N/V/F/Ch.   Objective:  There were no vitals filed for this visit. There is no height or weight on file to calculate BMI. Constitutional Well developed. Well nourished.  Vascular Foot warm and well perfused. Capillary refill normal to all digits.  Calf is soft and supple, no posterior calf or knee pain, negative Homans' sign  Neurologic Normal speech. Oriented to person, place, and time. Epicritic sensation to light touch grossly present bilaterally.  Dermatologic Incision is well-healed with the exception of a 1 cm x 0.5 cm area on the medial most portion of the incision, some serous drainage with suture material present  Orthopedic: He has mild edema, no tenderness to palpation noted about the surgical site.     Assessment:   1. Osteomyelitis of great toe of right foot (HCC)   2. Delayed surgical wound healing, initial encounter     Plan:  Patient was evaluated and treated and all questions answered.  S/p foot surgery right -All staples and sutures were removed today.  There was an area of delayed healing that I suspect is likely secondary to a small suture abscess in the medial portion of the incision.  This was debrided and the suture abscess evacuated.  Dressed with povidone iodine ointment and a bandage.  Recommend continuing daily dressing changes, Rx for gentamicin ointment sent to pharmacy.  Follow-up in 2 weeks plan to transition to collagen dressings at that point.  Return in about 2  weeks (around 12/14/2022) for post op (new x-rays), wound care.

## 2022-12-03 ENCOUNTER — Other Ambulatory Visit: Payer: Self-pay

## 2022-12-03 ENCOUNTER — Encounter (HOSPITAL_BASED_OUTPATIENT_CLINIC_OR_DEPARTMENT_OTHER): Payer: Self-pay

## 2022-12-03 ENCOUNTER — Emergency Department (HOSPITAL_BASED_OUTPATIENT_CLINIC_OR_DEPARTMENT_OTHER): Payer: Medicare PPO | Admitting: Radiology

## 2022-12-03 ENCOUNTER — Emergency Department (HOSPITAL_BASED_OUTPATIENT_CLINIC_OR_DEPARTMENT_OTHER): Payer: Medicare PPO

## 2022-12-03 ENCOUNTER — Emergency Department (HOSPITAL_BASED_OUTPATIENT_CLINIC_OR_DEPARTMENT_OTHER)
Admission: EM | Admit: 2022-12-03 | Discharge: 2022-12-03 | Disposition: A | Discharge status: Home / Self Care | Payer: Medicare PPO | Attending: Emergency Medicine | Admitting: Emergency Medicine

## 2022-12-03 DIAGNOSIS — E860 Dehydration: Secondary | ICD-10-CM | POA: Diagnosis not present

## 2022-12-03 DIAGNOSIS — D72829 Elevated white blood cell count, unspecified: Secondary | ICD-10-CM | POA: Insufficient documentation

## 2022-12-03 DIAGNOSIS — N189 Chronic kidney disease, unspecified: Secondary | ICD-10-CM | POA: Diagnosis not present

## 2022-12-03 DIAGNOSIS — E1122 Type 2 diabetes mellitus with diabetic chronic kidney disease: Secondary | ICD-10-CM | POA: Diagnosis not present

## 2022-12-03 DIAGNOSIS — I129 Hypertensive chronic kidney disease with stage 1 through stage 4 chronic kidney disease, or unspecified chronic kidney disease: Secondary | ICD-10-CM | POA: Diagnosis not present

## 2022-12-03 DIAGNOSIS — Z7984 Long term (current) use of oral hypoglycemic drugs: Secondary | ICD-10-CM | POA: Insufficient documentation

## 2022-12-03 DIAGNOSIS — R1110 Vomiting, unspecified: Secondary | ICD-10-CM | POA: Diagnosis present

## 2022-12-03 DIAGNOSIS — Z7982 Long term (current) use of aspirin: Secondary | ICD-10-CM | POA: Diagnosis not present

## 2022-12-03 DIAGNOSIS — K5900 Constipation, unspecified: Secondary | ICD-10-CM | POA: Diagnosis not present

## 2022-12-03 DIAGNOSIS — Z79899 Other long term (current) drug therapy: Secondary | ICD-10-CM | POA: Diagnosis not present

## 2022-12-03 LAB — I-STAT VENOUS BLOOD GAS, ED
Acid-Base Excess: 3 mmol/L — ABNORMAL HIGH (ref 0.0–2.0)
Bicarbonate: 27.6 mmol/L (ref 20.0–28.0)
Calcium, Ion: 1.16 mmol/L (ref 1.15–1.40)
HCT: 42 % (ref 39.0–52.0)
Hemoglobin: 14.3 g/dL (ref 13.0–17.0)
O2 Saturation: 82 %
Patient temperature: 97.7
Potassium: 4.8 mmol/L (ref 3.5–5.1)
Sodium: 138 mmol/L (ref 135–145)
TCO2: 29 mmol/L (ref 22–32)
pCO2, Ven: 41.1 mmHg — ABNORMAL LOW (ref 44–60)
pH, Ven: 7.434 — ABNORMAL HIGH (ref 7.25–7.43)
pO2, Ven: 44 mmHg (ref 32–45)

## 2022-12-03 LAB — LACTIC ACID, PLASMA
Lactic Acid, Venous: 2.4 mmol/L (ref 0.5–1.9)
Lactic Acid, Venous: 3 mmol/L (ref 0.5–1.9)
Lactic Acid, Venous: 4.3 mmol/L (ref 0.5–1.9)

## 2022-12-03 LAB — CBC
HCT: 46 % (ref 39.0–52.0)
Hemoglobin: 14.6 g/dL (ref 13.0–17.0)
MCH: 25 pg — ABNORMAL LOW (ref 26.0–34.0)
MCHC: 31.7 g/dL (ref 30.0–36.0)
MCV: 78.9 fL — ABNORMAL LOW (ref 80.0–100.0)
Platelets: 268 10*3/uL (ref 150–400)
RBC: 5.83 MIL/uL — ABNORMAL HIGH (ref 4.22–5.81)
RDW: 16.2 % — ABNORMAL HIGH (ref 11.5–15.5)
WBC: 15.2 10*3/uL — ABNORMAL HIGH (ref 4.0–10.5)
nRBC: 0 % (ref 0.0–0.2)

## 2022-12-03 LAB — URINALYSIS, ROUTINE W REFLEX MICROSCOPIC
Bacteria, UA: NONE SEEN
Bilirubin Urine: NEGATIVE
Glucose, UA: >1000 mg/dL — AB
Ketones, ur: 15 mg/dL — AB
Leukocytes,Ua: NEGATIVE
Nitrite: NEGATIVE
Specific Gravity, Urine: 1.019 (ref 1.005–1.030)
pH: 5.5 (ref 5.0–8.0)

## 2022-12-03 LAB — BASIC METABOLIC PANEL
Anion gap: 13 (ref 5–15)
BUN: 35 mg/dL — ABNORMAL HIGH (ref 8–23)
CO2: 26 mmol/L (ref 22–32)
Calcium: 9.3 mg/dL (ref 8.9–10.3)
Chloride: 102 mmol/L (ref 98–111)
Creatinine, Ser: 1.38 mg/dL — ABNORMAL HIGH (ref 0.61–1.24)
GFR, Estimated: 53 mL/min — ABNORMAL LOW (ref >60)
Glucose, Bld: 244 mg/dL — ABNORMAL HIGH (ref 70–99)
Potassium: 4.6 mmol/L (ref 3.5–5.1)
Sodium: 141 mmol/L (ref 135–145)

## 2022-12-03 LAB — COMPREHENSIVE METABOLIC PANEL
ALT: 9 U/L (ref 0–44)
AST: 16 U/L (ref 15–41)
Albumin: 4.1 g/dL (ref 3.5–5.0)
Alkaline Phosphatase: 93 U/L (ref 38–126)
Anion gap: 18 — ABNORMAL HIGH (ref 5–15)
BUN: 42 mg/dL — ABNORMAL HIGH (ref 8–23)
CO2: 22 mmol/L (ref 22–32)
Calcium: 9.8 mg/dL (ref 8.9–10.3)
Chloride: 100 mmol/L (ref 98–111)
Creatinine, Ser: 1.39 mg/dL — ABNORMAL HIGH (ref 0.61–1.24)
GFR, Estimated: 53 mL/min — ABNORMAL LOW (ref >60)
Glucose, Bld: 239 mg/dL — ABNORMAL HIGH (ref 70–99)
Potassium: 4 mmol/L (ref 3.5–5.1)
Sodium: 140 mmol/L (ref 135–145)
Total Bilirubin: 0.5 mg/dL (ref 0.3–1.2)
Total Protein: 8.1 g/dL (ref 6.5–8.1)

## 2022-12-03 LAB — CBG MONITORING, ED: Glucose-Capillary: 226 mg/dL — ABNORMAL HIGH (ref 70–99)

## 2022-12-03 LAB — TROPONIN I (HIGH SENSITIVITY)
Troponin I (High Sensitivity): 14 ng/L (ref <18)
Troponin I (High Sensitivity): 19 ng/L — ABNORMAL HIGH (ref <18)

## 2022-12-03 LAB — LIPASE, BLOOD: Lipase: <10 U/L — ABNORMAL LOW (ref 11–51)

## 2022-12-03 MED ORDER — ONDANSETRON HCL 4 MG/2ML IJ SOLN
4.0000 mg | Freq: Once | INTRAMUSCULAR | Status: AC
  Administered 2022-12-03: 4 mg via INTRAVENOUS
  Filled 2022-12-03: qty 2

## 2022-12-03 MED ORDER — IOHEXOL 300 MG/ML  SOLN
100.0000 mL | Freq: Once | INTRAMUSCULAR | Status: AC | PRN
  Administered 2022-12-03: 100 mL via INTRAVENOUS

## 2022-12-03 MED ORDER — LACTATED RINGERS IV BOLUS
1000.0000 mL | Freq: Once | INTRAVENOUS | Status: AC
  Administered 2022-12-03: 1000 mL via INTRAVENOUS

## 2022-12-03 MED ORDER — SODIUM CHLORIDE 0.9 % IV BOLUS
500.0000 mL | Freq: Once | INTRAVENOUS | Status: AC
  Administered 2022-12-03: 500 mL via INTRAVENOUS

## 2022-12-03 MED ORDER — ONDANSETRON HCL 4 MG PO TABS: 4.0000 mg | ORAL_TABLET | Freq: Four times a day (QID) | ORAL | 0 refills | Status: DC | PRN

## 2022-12-03 MED ORDER — POLYETHYLENE GLYCOL 3350 17 G PO PACK: 17.0000 g | PACK | Freq: Every day | ORAL | 0 refills | Status: AC

## 2022-12-03 MED ORDER — FLEET ENEMA 7-19 GM/118ML RE ENEM
1.0000 | ENEMA | Freq: Once | RECTAL | Status: AC
  Administered 2022-12-03: 1 via RECTAL
  Filled 2022-12-03: qty 1

## 2022-12-03 NOTE — ED Notes (Signed)
Patient transported to X-ray 

## 2022-12-03 NOTE — Discharge Instructions (Addendum)
Your testing is reassuring.  Keep yourself hydrated.  X-ray shows no evidence of bowel obstruction.  Take MiraLAX twice daily for the next 3 days and then once daily after this.  Return to the ED for worsening abdominal pain, fever, vomiting, chest pain, shortness of breath or other concerns.

## 2022-12-03 NOTE — ED Provider Notes (Signed)
Care of patient received from prior provider at 8:15 PM, please see their note for complete H/P and care plan.  Received handoff per ED course.  Clinical Course as of 12/03/22 2017  Sat Dec 03, 2022  1500 Stable  7 YOM with recent admission for osteo. Poor PO intake since discharge. Dehydrated getting 2LIVF and serial lactic with plan to DC if lactic is downtrending [CC]  1659 LA worsening Repeat VBG/BMP and bolus [CC]  1803 EMERGENCY DEPARTMENT  US GUIDANCE EXAM Emergency Ultrasound:  US Guidance for Needle Guidance  INDICATIONS: Difficult vascular access Linear probe used in real-time to visualize location of needle entry through skin.   PERFORMED BY: Myself IMAGES ARCHIVED?: Yes LIMITATIONS: None VIEWS USED: Transverse INTERPRETATION: Needle visualized within vein  [CC]  2014 I was called to bedside.   He states all of his symptoms have resolved and he is requesting discharge.  Lactic acid is downtrending. During patient's care, I was informed by nursing that many of his samples earlier in the day had been hemolyzed.  Therefore I placed my own ultrasound-guided IV as above. After this there was easy flow of lab work which demonstrated downtrending lactic acid.  Uncertain if patient has underlying pathology or if this is a laboratory abnormality that caused patient's elevated lactic acid. I discussed all of this with the patient that I cannot fully deem the patient is safe and there still remains a potential missed diagnosis today based on their limited evaluation.  I offered hospital observation, further diagnostic care and management and patient declined all these interventions stating that he feels fine and would like to follow-up outpatient with his PCP.  He expressed understanding of risk of more severe disease but still would like to be discharged. [CC]    Clinical Course User Index [CC] Glyn Ade, MD   Disposition:  I have considered need for hospitalization, however,  considering all of the above, I believe this patient is stable for discharge at this time.  Patient/family educated about specific return precautions for given chief complaint and symptoms.  Patient/family educated about follow-up with PCP.     Patient/family expressed understanding of return precautions and need for follow-up. Patient spoken to regarding all imaging and laboratory results and appropriate follow up for these results. All education provided in verbal form with additional information in written form. Time was allowed for answering of patient questions. Patient discharged.    Emergency Department Medication Summary:   Medications  sodium chloride 0.9 % bolus 500 mL (0 mLs Intravenous Stopped 12/03/22 1912)  ondansetron (ZOFRAN) injection 4 mg (4 mg Intravenous Given 12/03/22 0838)  sodium chloride 0.9 % bolus 500 mL (0 mLs Intravenous Stopped 12/03/22 1913)  iohexol (OMNIPAQUE) 300 MG/ML solution 100 mL (100 mLs Intravenous Contrast Given 12/03/22 1040)  sodium phosphate (FLEET) 7-19 GM/118ML enema 1 enema (1 enema Rectal Given 12/03/22 1122)  ondansetron (ZOFRAN) injection 4 mg (4 mg Intravenous Given 12/03/22 1224)  lactated ringers bolus 1,000 mL (0 mLs Intravenous Stopped 12/03/22 1515)  lactated ringers bolus 1,000 mL (0 mLs Intravenous Stopped 12/03/22 1914)  ondansetron (ZOFRAN) injection 4 mg (4 mg Intravenous Given 12/03/22 2007)            Glyn Ade, MD 12/03/22 2017

## 2022-12-03 NOTE — ED Triage Notes (Signed)
Pt presents with abdominal pain, constipation since Tuesday, and minimal emesis. Has not been able to eat for several days.

## 2022-12-03 NOTE — ED Notes (Addendum)
Respiratory Therapy walked with Micheal Morris around the nurses' station.  His O2 sat=94% and his HR=112 on room air.  Pt. Was not short of breath and is resting in his bed at this time.

## 2022-12-03 NOTE — ED Provider Notes (Signed)
EMERGENCY DEPARTMENT AT [facility] Provider Note   CSN: [id number] Arrival date & time: 12/03/22  4098     History  Chief Complaint  Patient presents with   Emesis   Constipation    Micheal Morris is a 77 y.o. male.  Patient with a history of hypertension, diabetes, CKD, hyperlipidemia, recent osteomyelitis of his right foot status post great toe amputation presenting with constipation, abdominal pain and vomiting.  States he has not had a bowel movement for the past 4 days.  Normally goes every day.  Tried suppository at home without relief.  Having diffuse abdominal cramping and feels like he needs to have a bowel movement cannot.  Several episodes of vomiting yesterday but just dry heaving this morning.  Decreased p.o. intake yesterday.  Takes Linzess for constipation at baseline but denies any history of IBS or IBD.  Previous cholecystectomy.  Still has appendix.  No chest pain or shortness of breath.  No fever.  He is on topical antibiotics for his foot but no p.o. antibiotics currently.  He saw his podiatrist 3 days ago and was thought to be healing well. States he feels like he is dehydrated.  The history is provided by the patient and the spouse.  Emesis Associated symptoms: abdominal pain   Associated symptoms: no arthralgias, no cough, no fever, no headaches and no myalgias   Constipation Associated symptoms: abdominal pain and vomiting   Associated symptoms: no dysuria and no fever        Home Medications Prior to Admission medications   Medication Sig Start Date End Date Taking? Authorizing Provider  acetaminophen (TYLENOL) 325 MG tablet Take 2 tablets (650 mg total) by mouth every 6 (six) hours as needed for mild pain, fever or headache. 11/13/22   Lonia Blood, MD  amLODipine-benazepril (LOTREL) 10-20 MG capsule Take 1 capsule by mouth daily.    [provider]  aspirin 81 MG chewable tablet Chew 81 mg by mouth daily.     [provider]  BD PEN NEEDLE NANO U/F 32G X 4 MM MISC USE AS DIRECTED 4 TIMES A DAY 03/30/18   [provider]  bimatoprost (LUMIGAN) 0.01 % SOLN Place 1 drop into both eyes at bedtime. 09/16/14   [provider]  Blood Glucose Monitoring Suppl (ACCU-CHEK GUIDE) w/Device KIT  08/29/19   [provider]  brimonidine-timolol (COMBIGAN) 0.2-0.5 % ophthalmic solution Place 1 drop into both eyes 2 (two) times daily. 04/12/15   [provider]  docusate sodium (COLACE) 100 MG capsule Take 100 mg by mouth 2 (two) times daily.    [provider]  gabapentin (NEURONTIN) 300 MG capsule Take 300 mg by mouth 2 (two) times daily.    [provider]  gentamicin ointment (GARAMYCIN) 0.1 % Apply 1 Application topically daily. 11/30/22   McDonald, Adam R, DPM  HUMALOG KWIKPEN 100 UNIT/ML KiwkPen Inject 0-60 Units into the skin 3 (three) times daily. Per sliding scale three times daily with meals 03/13/18   [provider]  JARDIANCE 25 MG TABS tablet Take 25 mg by mouth daily. 02/21/18   [provider]  Lancets Letta Pate ULTRASOFT) lancets USE AS INSTRUCTED 06/28/13   [provider]  LANTUS SOLOSTAR 100 UNIT/ML Solostar Pen Inject 90 Units into the skin daily. 04/16/20   [provider]  levofloxacin (LEVAQUIN) 750 MG tablet Take 1 tablet (750 mg total) by mouth daily. 11/14/22   Lonia Blood, MD  linaclotide Karlene Einstein)  290 MCG CAPS capsule Take 290 mcg by mouth daily before breakfast.    [provider]  liraglutide (VICTOZA) 18 MG/3ML SOPN Inject 1.8 mg into the skin daily.    [provider]  LOTEMAX 0.5 % GEL Apply 1 drop to eye 3 (three) times daily. 12/29/21   [provider]  metoprolol succinate (TOPROL-XL) 100 MG 24 hr tablet Take 1 tablet by mouth every evening. 04/14/20   [provider]  mirabegron ER (MYRBETRIQ) 50 MG TB24 tablet Take 50 mg by mouth daily. 09/27/19    [provider]  Multiple Vitamin (MULTIVITAMIN) tablet Take 1 tablet by mouth daily. Centrum silver    [provider]  nutrition supplement, JUVEN, (JUVEN) PACK Take 1 packet by mouth 2 (two) times daily between meals. 11/13/22   Lonia Blood, MD  Nutritional Supplements (,FEEDING SUPPLEMENT, PROSOURCE PLUS) liquid Take 30 mLs by mouth 3 (three) times daily between meals. 11/13/22 01/12/23  Lonia Blood, MD  ONE TOUCH ULTRA TEST test strip  03/12/18   [provider]  pantoprazole (PROTONIX) 40 MG tablet Take 1 tablet by mouth daily. 11/15/21   [provider]  Probiotic Product (PHILLIPS COLON HEALTH) CAPS Take 1 capsule by mouth in the morning and at bedtime.    [provider]  silver sulfADIAZINE (SILVADENE) 1 % cream Apply 1 Application topically daily. 10/27/22   McDonald, Rachelle Hora, DPM  simvastatin (ZOCOR) 20 MG tablet Take 20 mg by mouth daily. 10/23/19   [provider]  spironolactone (ALDACTONE) 25 MG tablet Take 25 mg by mouth daily.  01/30/18   [provider]  torsemide (DEMADEX) 20 MG tablet Take 20 mg by mouth daily. 09/27/19   [provider]  vardenafil (LEVITRA) 20 MG tablet Take 20 mg by mouth daily as needed for erectile dysfunction. 08/07/12   [provider]      Allergies    Nsaids, Aspirin, and Other    Review of Systems   Review of Systems  Constitutional:  Positive for activity change and appetite change. Negative for fever.  HENT:  Negative for congestion and rhinorrhea.   Respiratory:  Negative for cough, chest tightness and shortness of breath.   Cardiovascular:  Negative for chest pain.  Gastrointestinal:  Positive for abdominal pain, constipation and vomiting.  Genitourinary:  Negative for dysuria and hematuria.  Musculoskeletal:  Negative for arthralgias and myalgias.  Skin:  Negative for rash.  Neurological:  Negative for dizziness, weakness and headaches.   all other systems  are negative except as noted in the HPI and PMH.    Physical Exam Updated Vital Signs BP (!) 153/78 (BP Location: Right Arm)   Pulse (!) 101   Temp 97.7 F (36.5 C) (Oral)   Resp 16   Ht 5\' 10"  (1.778 m)   Wt (!) 137 kg   SpO2 96%   BMI 43.34 kg/m  Physical Exam Vitals and nursing note reviewed.  Constitutional:      General: He is not in acute distress.    Appearance: He is well-developed.  HENT:     Head: Normocephalic and atraumatic.     Mouth/Throat:     Mouth: Mucous membranes are dry.     Pharynx: No oropharyngeal exudate.  Eyes:     Conjunctiva/sclera: Conjunctivae normal.     Pupils: Pupils are equal, round, and reactive to light.  Neck:     Comments: No meningismus. Cardiovascular:     Rate and Rhythm: Normal  rate and regular rhythm.     Heart sounds: Normal heart sounds. No murmur heard. Pulmonary:     Effort: Pulmonary effort is normal. No respiratory distress.     Breath sounds: Normal breath sounds.  Abdominal:     Palpations: Abdomen is soft.     Tenderness: There is abdominal tenderness. There is no guarding or rebound.     Comments: Diffuse tenderness, no guarding or rebound  Genitourinary:    Comments: Soft stool just past examining finger.  Unable to reach manually.  No gross blood Musculoskeletal:        General: No tenderness. Normal range of motion.     Cervical back: Normal range of motion and neck supple.  Skin:    General: Skin is warm.  Neurological:     Mental Status: He is alert and oriented to person, place, and time.     Cranial Nerves: No cranial nerve deficit.     Motor: No abnormal muscle tone.     Coordination: Coordination normal.     Comments:  5/5 strength throughout. CN 2-12 intact.Equal grip strength.   Psychiatric:        Behavior: Behavior normal.     ED Results / Procedures / Treatments   Labs (all labs ordered are listed, but only abnormal results are displayed) Labs Reviewed  LIPASE, BLOOD - Abnormal; Notable for  the following components:      Result Value   Lipase <10 (*)    All other components within normal limits  COMPREHENSIVE METABOLIC PANEL - Abnormal; Notable for the following components:   Glucose, Bld 239 (*)    BUN 42 (*)    Creatinine, Ser 1.39 (*)    GFR, Estimated 53 (*)    Anion gap 18 (*)    All other components within normal limits  CBC - Abnormal; Notable for the following components:   WBC 15.2 (*)    RBC 5.83 (*)    MCV 78.9 (*)    MCH 25.0 (*)    RDW 16.2 (*)    All other components within normal limits  URINALYSIS, ROUTINE W REFLEX MICROSCOPIC - Abnormal; Notable for the following components:   Color, Urine COLORLESS (*)    Glucose, UA >1,000 (*)    Hgb urine dipstick TRACE (*)    Ketones, ur 15 (*)    Protein, ur TRACE (*)    All other components within normal limits  LACTIC ACID, PLASMA - Abnormal; Notable for the following components:   Lactic Acid, Venous 2.4 (*)    All other components within normal limits  LACTIC ACID, PLASMA - Abnormal; Notable for the following components:   Lactic Acid, Venous 3.0 (*)    All other components within normal limits  OCCULT BLOOD X 1 CARD TO LAB, STOOL  LACTIC ACID, PLASMA  LACTIC ACID, PLASMA  TROPONIN I (HIGH SENSITIVITY)  TROPONIN I (HIGH SENSITIVITY)    EKG EKG Interpretation  Date/Time:  Saturday Dec 03 2022 12:28:05 EDT Ventricular Rate:  98 PR Interval:  205 QRS Duration: 80 QT Interval:  371 QTC Calculation: 474 R Axis:   31 Text Interpretation: Sinus rhythm Low voltage, precordial leads Borderline T abnormalities, inferior leads No significant change was found Confirmed by Glynn Octave [phone]) on 12/03/2022 12:30:36 PM  Radiology CT ABDOMEN PELVIS W CONTRAST  Result Date: 12/03/2022 CLINICAL DATA:  Diffuse abdominal pain and vomiting for 5 days. EXAM: CT ABDOMEN AND PELVIS WITH CONTRAST TECHNIQUE: Multidetector CT imaging of the abdomen  and pelvis was performed using the standard protocol following  bolus administration of intravenous contrast. RADIATION DOSE REDUCTION: This exam was performed according to the departmental dose-optimization program which includes automated exposure control, adjustment of the mA and/or kV according to patient size and/or use of iterative reconstruction technique. CONTRAST:  OMNIPAQUE IOHEXOL 300 MG/ML  SOLN COMPARISON:  07/04/2019 FINDINGS: Lower Chest: No acute findings. Hepatobiliary: No hepatic masses identified. Prior cholecystectomy. No evidence of biliary obstruction. Pancreas:  No mass or inflammatory changes. Spleen: Within normal limits in size and appearance. Adrenals/Urinary Tract: No suspicious masses identified. No evidence of ureteral calculi or hydronephrosis. Stomach/Bowel: No evidence of obstruction, inflammatory process or abnormal fluid collections. Vascular/Lymphatic: No pathologically enlarged lymph nodes. No acute vascular findings. Aortic atherosclerotic calcification incidentally noted. Reproductive:  No mass or other significant abnormality. Other:  None. Musculoskeletal:  No suspicious bone lesions identified. IMPRESSION: No acute findings or other significant abnormality. Aortic Atherosclerosis (ICD10-I70.0). Electronically Signed   By: Danae Orleans M.D.   On: 12/03/2022 11:05   DG Abdomen Acute W/Chest  Result Date: 12/03/2022 CLINICAL DATA:  77 year old male with history of generalized abdominal pain, vomiting and constipation. EXAM: DG ABDOMEN ACUTE WITH 1 VIEW CHEST COMPARISON:  Chest x-ray 08/25/2019. FINDINGS: Lung volumes are normal. No consolidative airspace disease. No pleural effusions. No pneumothorax. No pulmonary nodule or mass noted. Pulmonary vasculature and the cardiomediastinal silhouette are within normal limits. Atherosclerotic calcifications in the thoracic aorta. Gas and stool are seen scattered throughout the colon extending to the level of the distal rectum. No pathologic distension of small bowel is noted. No gross  evidence of pneumoperitoneum. IMPRESSION: 1.   1.  Nonobstructive bowel gas pattern. 2. No pneumoperitoneum. 3. No radiographic evidence of acute cardiopulmonary disease. 4. Aortic atherosclerosis. Electronically Signed   By: Trudie Reed M.D.   On: 12/03/2022 08:41    Procedures Procedures    Medications Ordered in ED Medications  sodium chloride 0.9 % bolus 500 mL (has no administration in time range)  ondansetron (ZOFRAN) injection 4 mg (has no administration in time range)    ED Course/ Medical Decision Making/ A&P Clinical Course as of 12/03/22 1502  Sat Dec 03, 2022  1500 Stable  76 YOM with recent admission for osteo. Poor PO intake since discharge. Dehydrated getting 2LIVF and serial lactic with plan to DC if lactic is downtrending [CC]    Clinical Course User Index [CC] Glyn Ade, MD                             Medical Decision Making Amount and/or Complexity of Data Reviewed Independent Historian: spouse Labs: ordered. Decision-making details documented in ED Course. Radiology: ordered and independent interpretation performed. Decision-making details documented in ED Course. ECG/medicine tests: ordered and independent interpretation performed. Decision-making details documented in ED Course.  Risk OTC drugs. Prescription drug management.   Constipation for 4 days with abdominal pain, distention, nausea and vomiting.  Vital stable, no distress, abdomen soft without peritoneal signs.  Patient does have some stool palpable past examining finger but unable to disimpact manually.  Will hydrate gently, check labs, evaluate for bowel obstruction with imaging.  Acute abdominal series negative for bowel obstruction or free air.  Labs reassuring with stable creatinine.  Lactate 2.4.  Labs show leukocytosis of 15 with this appears to be chronic.  Patient is hydrated gently. CT scan is negative for small bowel obstruction or other acute pathology.  No  bowel  obstruction.  No appendicitis.  Was able to have a successful bowel movement after enema and feels much improved.  He is tolerating p.o.  No further vomiting.  Lactate however has risen from 2.4-3.0.  Patient feeling improved.  Denies abdominal pain.  He is tolerating p.o.  Will give additional IV fluids.  Patient feels improved.  He is tolerating p.o.  Describes abdominal pain is minimal.  Discussed initiating MiraLAX in addition to his Colace that he is currently taking as well as his Linzess.  Care to be transferred at shift change pending improvement in his lactate.        Final Clinical Impression(s) / ED Diagnoses Final diagnoses:  Constipation, unspecified constipation type    Rx / DC Orders ED Discharge Orders     None         Brittainy Bucker, Jeannett Senior, MD 12/03/22 1504

## 2022-12-04 ENCOUNTER — Other Ambulatory Visit: Payer: Self-pay

## 2022-12-04 LAB — LACTIC ACID, PLASMA: Lactic Acid, Venous: 2.5 mmol/L (ref 0.5–1.9)

## 2022-12-19 ENCOUNTER — Ambulatory Visit (INDEPENDENT_AMBULATORY_CARE_PROVIDER_SITE_OTHER): Payer: Medicare PPO

## 2022-12-19 ENCOUNTER — Ambulatory Visit (INDEPENDENT_AMBULATORY_CARE_PROVIDER_SITE_OTHER): Payer: Medicare PPO | Admitting: Podiatry

## 2022-12-19 DIAGNOSIS — M79675 Pain in left toe(s): Secondary | ICD-10-CM | POA: Diagnosis not present

## 2022-12-19 DIAGNOSIS — M2141 Flat foot [pes planus] (acquired), right foot: Secondary | ICD-10-CM

## 2022-12-19 DIAGNOSIS — E0843 Diabetes mellitus due to underlying condition with diabetic autonomic (poly)neuropathy: Secondary | ICD-10-CM

## 2022-12-19 DIAGNOSIS — M869 Osteomyelitis, unspecified: Secondary | ICD-10-CM

## 2022-12-19 DIAGNOSIS — M79674 Pain in right toe(s): Secondary | ICD-10-CM

## 2022-12-19 DIAGNOSIS — M86171 Other acute osteomyelitis, right ankle and foot: Secondary | ICD-10-CM | POA: Diagnosis not present

## 2022-12-19 DIAGNOSIS — M2142 Flat foot [pes planus] (acquired), left foot: Secondary | ICD-10-CM

## 2022-12-19 DIAGNOSIS — Z89411 Acquired absence of right great toe: Secondary | ICD-10-CM

## 2022-12-19 DIAGNOSIS — B351 Tinea unguium: Secondary | ICD-10-CM

## 2022-12-20 NOTE — Progress Notes (Signed)
  Subjective:  Patient ID: Micheal Morris, male    DOB: 02-09-1946,  MRN: [id number]  Chief Complaint  Patient presents with   Routine Post Op    POV #3 DOS 11/11/2022 for R great toe amputation PER Keely      77 y.o. male returns for post-op check.  Returns for follow-up has not any drainage, swelling is improving.  His nails are also thickened elongated and have not been cut since before surgery.  There is a callus on the outside of the heel as well.  Review of Systems: Negative except as noted in the HPI. Denies N/V/F/Ch.   Objective:  There were no vitals filed for this visit. There is no height or weight on file to calculate BMI. Constitutional Well developed. Well nourished.  Vascular Foot warm and well perfused. Capillary refill normal to all digits.  Calf is soft and supple, no posterior calf or knee pain, negative Homans' sign  Neurologic Normal speech. Oriented to person, place, and time. Epicritic sensation to light touch grossly present bilaterally.  Dermatologic Incision is well-healed and not atrophic, no open portions of the wound are present.  Nails thickened elongated causing pain and discomfort with subungual debris and yellow discoloration.  Healed wound with callus formation plantar lateral right heel  Orthopedic: He has moderate edema, no tenderness to palpation noted about the surgical site.    Radiographs show unchanged appearance of amputation site with no osteolysis or erosions noted in metatarsal head Assessment:   1. Osteomyelitis of great toe of right foot (HCC)   2. Status post amputation of great toe, right (HCC)   3. Pes planus of both feet   4. Diabetes mellitus due to underlying condition with diabetic autonomic neuropathy, unspecified whether long term insulin use (HCC)     Plan:  Patient was evaluated and treated and all questions answered.  S/p foot surgery right -Doing very well he may begin to transition back to regular shoe gear and  hygiene does not need further ointment or dressings on incision.  Would like him to be fitted for extra-depth diabetic shoes and amputation filler for the right foot first toe.  He will be scheduled for this.  Advised on signs and symptoms of recurrent infection especially pressure on the second toe.  They will see me ASAP if any of these develop.  Dry skin on the healed wound on the plantar lateral right foot was debrided, there is no open ulceration.  Recommended using urea containing foot cream such as O'Keefe's  Discussed the etiology and treatment options for the condition in detail with the patient. Recommended debridement of the nails today. Sharp and mechanical debridement performed of all painful and mycotic nails today. Nails debrided in length and thickness using a nail nipper to level of comfort. Discussed treatment options including appropriate shoe gear. Follow up as needed in the future with Dr. Stacie Acres again for painful nails.    Return in about 3 months (around 03/21/2023) for at risk diabetic foot care.

## 2023-01-09 ENCOUNTER — Ambulatory Visit: Payer: Medicare PPO | Admitting: Podiatry

## 2023-01-16 ENCOUNTER — Ambulatory Visit (INDEPENDENT_AMBULATORY_CARE_PROVIDER_SITE_OTHER): Payer: Medicare PPO | Admitting: Podiatrist

## 2023-01-16 DIAGNOSIS — M2142 Flat foot [pes planus] (acquired), left foot: Secondary | ICD-10-CM

## 2023-01-16 DIAGNOSIS — M2141 Flat foot [pes planus] (acquired), right foot: Secondary | ICD-10-CM

## 2023-01-16 DIAGNOSIS — Z89411 Acquired absence of right great toe: Secondary | ICD-10-CM

## 2023-01-16 DIAGNOSIS — E0843 Diabetes mellitus due to underlying condition with diabetic autonomic (poly)neuropathy: Secondary | ICD-10-CM

## 2023-01-16 NOTE — Progress Notes (Signed)
Patient presents today to be measured for  diabetic shoes and insoles.  Patient was measured for 1 pair of diabetic shoes and 3 pairs of foam casted diabetic insoles and a toe filler for the Rt ft .   Ht 5'10 Wt 272 Shoe size 14 2e Brooks black velcro [id number]  [phone]  Re-appointment for regularly scheduled diabetic foot care visits or if they should experience any trouble with the shoes or insoles.

## 2023-01-17 ENCOUNTER — Telehealth: Payer: Self-pay | Admitting: Podiatry

## 2023-01-17 MED ORDER — DOXYCYCLINE HYCLATE 100 MG PO CAPS: 100.0000 mg | ORAL_CAPSULE | Freq: Two times a day (BID) | ORAL | 0 refills | Status: AC

## 2023-01-17 NOTE — Telephone Encounter (Signed)
Patient called.  He had a right hallux amp by Dr. Lilian Kapur recently.  He now states the right 2nd toe had a blister on the bottom of the toe, which popped recently, and now has an opening on the dorsal aspect of that toe (2nd) near the nail which has been draining clearing/pinkish fluid.  Bandaid is pretty wet each day.  They did switch to betadine since someone advised them of this yesterday.  They don't have an appt with Dr. Lilian Kapur until August and are currently on the wait list for any cancellations.  I informed them we'd place him at a high priority for cancellations to try to see him sooner.  Will send in oral abx for now.  Use either the gentamicin ointment, or the betadine he just started using, but switch from Bandaids to a gauze dressing on the 2nd toe.

## 2023-01-31 ENCOUNTER — Ambulatory Visit: Payer: Medicare PPO | Admitting: Podiatry

## 2023-01-31 DIAGNOSIS — L97512 Non-pressure chronic ulcer of other part of right foot with fat layer exposed: Secondary | ICD-10-CM | POA: Diagnosis not present

## 2023-01-31 DIAGNOSIS — L97509 Non-pressure chronic ulcer of other part of unspecified foot with unspecified severity: Secondary | ICD-10-CM | POA: Diagnosis not present

## 2023-01-31 DIAGNOSIS — E11621 Type 2 diabetes mellitus with foot ulcer: Secondary | ICD-10-CM | POA: Diagnosis not present

## 2023-01-31 DIAGNOSIS — M2041 Other hammer toe(s) (acquired), right foot: Secondary | ICD-10-CM

## 2023-01-31 DIAGNOSIS — Z89419 Acquired absence of unspecified great toe: Secondary | ICD-10-CM

## 2023-01-31 MED ORDER — SANTYL 250 UNIT/GM EX OINT
1.0000 | TOPICAL_OINTMENT | Freq: Every day | CUTANEOUS | 0 refills | Status: AC
Start: 2023-01-31 — End: ?

## 2023-01-31 MED ORDER — DOXYCYCLINE HYCLATE 100 MG PO CAPS
100.0000 mg | ORAL_CAPSULE | Freq: Two times a day (BID) | ORAL | 0 refills | Status: AC
Start: 2023-01-31 — End: 2023-02-10

## 2023-01-31 NOTE — Progress Notes (Unsigned)
Chief Complaint  Patient presents with   Foot Swelling    Pt states he has fluid in his foot which is really swollen he was prescribed medication for the swelling but the foot just keep getting worse started about 12 days ago   HPI: 77 y.o. male presenting today with concern of swelling in the right foot.  He underwent a right hallux amputation by Dr. Lilian Kapur a couple months ago, secondary to osteomyelitis.  He stated that this healed uneventfully.  However within the past 12 days he feels that there is been an increase in swelling in that foot.  He states that he was prescribed some medication for the swelling but it is not improving.  He feels like his compression stockings have aggravated the swelling.  If he is tries to wear open toe compression stockings it keeps the swelling "stuck" in the toes.  Past Medical History:  Diagnosis Date   Arthritis    "knees" (01/01/2016)   Cataract    OS   CHF (congestive heart failure) (HCC)    Chronic kidney disease    Diabetes mellitus (HCC)    Diabetic retinopathy (HCC)    NPDR OU   Difficult intubation    Erectile dysfunction    GERD (gastroesophageal reflux disease)    Glaucoma    OS   HTN (hypertension)    Hyperlipidemia    Neuromuscular disorder (HCC)    neuropathy   Obesity    OSA on CPAP    Pain of left upper arm    Restrictive lung disease    Sensory hearing loss, bilateral    Sleep apnea    SOBOE (shortness of breath on exertion) 11/03/2015   currently goes to gym   Type II diabetes mellitus (HCC)     Past Surgical History:  Procedure Laterality Date   AMPUTATION TOE Right 11/11/2022   Procedure: RIGHT AMPUTATION  of  GREAT TOE;  Surgeon: Edwin Cap, DPM;  Location: [facility] OR;  Service: Podiatry;  Laterality: Right;   CATARACT EXTRACTION Right 03/23/2020   Dr. Chalmers Guest   COLONOSCOPY     EYE SURGERY Right 03/23/2020   Cat Sx - Dr. Chalmers Guest   GLAUCOMA SURGERY Bilateral    "laser"   JOINT REPLACEMENT Left     knee   LAPAROSCOPIC CHOLECYSTECTOMY  09/17/2018   TOTAL KNEE ARTHROPLASTY Left 11/09/2015   Procedure: LEFT TOTAL KNEE ARTHROPLASTY;  Surgeon: Ollen Gross, MD;  Location: [facility];  Service: Orthopedics;  Laterality: Left;    Allergies  Allergen Reactions   Nsaids Other (See Comments)    GI BLEED   Aspirin Other (See Comments)    STOMACH ULCER  STOMACH ULCER  STOMACH ULCER  STOMACH ULCER  STOMACH ULCER  STOMACH ULCER  STOMACH ULCER    Other Other (See Comments)    blisters   Smoker?: non-smoker  Review of Systems  Musculoskeletal:        Hammertoe right 2nd toe. Previous right hallux amputation.  Skin:        Edema right forefoot. Ulcer distal right 2nd toe    PHYSICAL EXAM: There were no vitals filed for this visit.  General: The patient is alert and oriented x3 in no acute distress.  Dermatology: Skin is warm, dry and supple bilateral lower extremities. Interspaces are clear of maceration and debris.      Wound 1 description:  Location: Distal right second toe.    Depth: subQ    Wound  Border: Hemorrhagic hyperkeratosis    Odor?:  No    Surrounding Tissue: Edematous with mild calor    Infected?:  Yes    Necrosis?:  No    Pain?:  No    Tunneling: No    Dimensions (cm): 0.3 x 0.4 x 0.2 cm  Vascular: Pedal pulses are diminished  Neurological: Light touch sensation diminished bilateral feet.   Musculoskeletal Exam: There is a semirigid contracture of the right second toe at the PIP joint.  This is not fully reducible.  There is previous right hallux amputation     Latest Ref Rng & Units 11/10/2022    5:52 PM  Hemoglobin A1C  Hemoglobin-A1c 4.8 - 5.6 % 7.8    ASSESSMENT / PLAN OF CARE: 1. Ulcer of great toe, right, with fat layer exposed (HCC)      Meds ordered this encounter  Medications   collagenase (SANTYL) 250 UNIT/GM ointment    Sig: Apply 1 Application topically daily.    Dispense:  15 g    Refill:  0   doxycycline (VIBRAMYCIN) 100 MG  capsule    Sig: Take 1 capsule (100 mg total) by mouth 2 (two) times daily for 10 days.    Dispense:  20 capsule    Refill:  0   The ulceration was sharply debrided of hyperkeratotic and devitalized soft tissue with sterile #312 blade to the level of subQ .  Hemostasis obtained.  Medihoney ointment and DSD applied.  Reviewed off-loading with patient.  He notes that he has a surgical shoe at home and will switch back to that instead of wearing regular shoes.  He will avoid his compression stockings for now but will need to try to elevate the foot above the level of the heart several times per day for at least 20 to 30 minutes at a time  Reviewed daily dressing changes with patient.  He was prescribed collagenase Santyl ointment to begin applying to the ulcer.  They were informed they can use their gentamicin ointment mixed with the Santyl at time of application.  They will need to wrap the toe with gauze and avoid any Band-Aid use.  Keep the area clean and dry.  Keep it weightbearing to a minimum for now.  Prescribed doxycycline 100 mg twice daily for 10 days.  Discussed risks / concerns regarding ulcer with patient and possible sequelae if left untreated.  Stressed importance of infection prevention at home. Short-term goals are:  resolve infection, off-load ulcer, heal ulcer Long-term goals are:  prevent recurrence, prevent amputation.   Return in about 2 weeks (around 02/14/2023) for w/ Dr. Lilian Kapur for ulcer check right 2nd toe.  If ulcer not improved significantly, he will need x-ray evaluation of the second toe to rule out any periosteal changes to the distal phalanx of the second toe   Briah Nary DBurna Mortimer, DPM, FACFAS [facility]     [address] [place], [place] [address]                Office [phone]  Fax [phone]

## 2023-02-02 DIAGNOSIS — Z89419 Acquired absence of unspecified great toe: Secondary | ICD-10-CM | POA: Insufficient documentation

## 2023-02-02 DIAGNOSIS — M2041 Other hammer toe(s) (acquired), right foot: Secondary | ICD-10-CM | POA: Insufficient documentation

## 2023-02-02 DIAGNOSIS — L97512 Non-pressure chronic ulcer of other part of right foot with fat layer exposed: Secondary | ICD-10-CM | POA: Insufficient documentation

## 2023-02-09 ENCOUNTER — Telehealth: Payer: Self-pay

## 2023-02-09 NOTE — Telephone Encounter (Signed)
Patient called to say that he will finish the antibiotics tomorrow, and that his ulcer is still draining/bleeding and that the swelling has not improved. He has an appointment next week - dow we need to bring him in sooner? Or should we refill the antibiotics?

## 2023-02-10 ENCOUNTER — Encounter: Payer: Self-pay | Admitting: Podiatry

## 2023-02-10 MED ORDER — AMOXICILLIN-POT CLAVULANATE 875-125 MG PO TABS: 1.0000 | ORAL_TABLET | Freq: Two times a day (BID) | ORAL | 0 refills | Status: DC

## 2023-02-10 NOTE — Telephone Encounter (Signed)
Patient finishing up his doxycycline abx.  He called and said the toe is still draining.  Has appt next week.  Rx Augmentin 875mg  BID sent to his pharmacy x 10 days.  F/u as scheduled.  Keep wound covered and change dressing daily.

## 2023-02-10 NOTE — Addendum Note (Signed)
Addended byLucia Estelle D on: 02/10/2023 08:09 AM   Modules accepted: Orders

## 2023-02-14 ENCOUNTER — Emergency Department (HOSPITAL_COMMUNITY): Payer: Medicare PPO

## 2023-02-14 ENCOUNTER — Ambulatory Visit: Payer: Medicare PPO

## 2023-02-14 ENCOUNTER — Inpatient Hospital Stay (HOSPITAL_COMMUNITY)
Admission: EM | Admit: 2023-02-14 | Discharge: 2023-02-20 | DRG: 617 | Disposition: A | Discharge status: Home / Self Care | Payer: Medicare PPO | Attending: Internal Medicine | Admitting: Internal Medicine

## 2023-02-14 ENCOUNTER — Encounter (HOSPITAL_COMMUNITY): Payer: Self-pay

## 2023-02-14 ENCOUNTER — Ambulatory Visit: Payer: Medicare PPO | Admitting: Podiatry

## 2023-02-14 ENCOUNTER — Other Ambulatory Visit: Payer: Self-pay

## 2023-02-14 DIAGNOSIS — M868X7 Other osteomyelitis, ankle and foot: Secondary | ICD-10-CM | POA: Diagnosis present

## 2023-02-14 DIAGNOSIS — Z96652 Presence of left artificial knee joint: Secondary | ICD-10-CM | POA: Diagnosis present

## 2023-02-14 DIAGNOSIS — E1140 Type 2 diabetes mellitus with diabetic neuropathy, unspecified: Secondary | ICD-10-CM | POA: Diagnosis present

## 2023-02-14 DIAGNOSIS — L02611 Cutaneous abscess of right foot: Secondary | ICD-10-CM | POA: Diagnosis present

## 2023-02-14 DIAGNOSIS — Z7984 Long term (current) use of oral hypoglycemic drugs: Secondary | ICD-10-CM

## 2023-02-14 DIAGNOSIS — Z8249 Family history of ischemic heart disease and other diseases of the circulatory system: Secondary | ICD-10-CM

## 2023-02-14 DIAGNOSIS — L03115 Cellulitis of right lower limb: Secondary | ICD-10-CM | POA: Diagnosis not present

## 2023-02-14 DIAGNOSIS — E1165 Type 2 diabetes mellitus with hyperglycemia: Secondary | ICD-10-CM | POA: Diagnosis present

## 2023-02-14 DIAGNOSIS — L03119 Cellulitis of unspecified part of limb: Principal | ICD-10-CM

## 2023-02-14 DIAGNOSIS — K219 Gastro-esophageal reflux disease without esophagitis: Secondary | ICD-10-CM | POA: Diagnosis present

## 2023-02-14 DIAGNOSIS — E11628 Type 2 diabetes mellitus with other skin complications: Secondary | ICD-10-CM | POA: Diagnosis present

## 2023-02-14 DIAGNOSIS — A419 Sepsis, unspecified organism: Secondary | ICD-10-CM

## 2023-02-14 DIAGNOSIS — L97512 Non-pressure chronic ulcer of other part of right foot with fat layer exposed: Secondary | ICD-10-CM

## 2023-02-14 DIAGNOSIS — I10 Essential (primary) hypertension: Secondary | ICD-10-CM | POA: Diagnosis present

## 2023-02-14 DIAGNOSIS — Z794 Long term (current) use of insulin: Secondary | ICD-10-CM | POA: Diagnosis not present

## 2023-02-14 DIAGNOSIS — H409 Unspecified glaucoma: Secondary | ICD-10-CM | POA: Diagnosis present

## 2023-02-14 DIAGNOSIS — E11621 Type 2 diabetes mellitus with foot ulcer: Secondary | ICD-10-CM | POA: Diagnosis present

## 2023-02-14 DIAGNOSIS — I5032 Chronic diastolic (congestive) heart failure: Secondary | ICD-10-CM | POA: Diagnosis present

## 2023-02-14 DIAGNOSIS — Z79899 Other long term (current) drug therapy: Secondary | ICD-10-CM

## 2023-02-14 DIAGNOSIS — N179 Acute kidney failure, unspecified: Secondary | ICD-10-CM | POA: Diagnosis present

## 2023-02-14 DIAGNOSIS — E1169 Type 2 diabetes mellitus with other specified complication: Secondary | ICD-10-CM | POA: Diagnosis present

## 2023-02-14 DIAGNOSIS — L97513 Non-pressure chronic ulcer of other part of right foot with necrosis of muscle: Secondary | ICD-10-CM | POA: Diagnosis not present

## 2023-02-14 DIAGNOSIS — E113293 Type 2 diabetes mellitus with mild nonproliferative diabetic retinopathy without macular edema, bilateral: Secondary | ICD-10-CM | POA: Diagnosis present

## 2023-02-14 DIAGNOSIS — G4730 Sleep apnea, unspecified: Secondary | ICD-10-CM | POA: Diagnosis present

## 2023-02-14 DIAGNOSIS — I130 Hypertensive heart and chronic kidney disease with heart failure and stage 1 through stage 4 chronic kidney disease, or unspecified chronic kidney disease: Secondary | ICD-10-CM | POA: Diagnosis present

## 2023-02-14 DIAGNOSIS — E1152 Type 2 diabetes mellitus with diabetic peripheral angiopathy with gangrene: Secondary | ICD-10-CM | POA: Diagnosis present

## 2023-02-14 DIAGNOSIS — Z7982 Long term (current) use of aspirin: Secondary | ICD-10-CM

## 2023-02-14 DIAGNOSIS — L089 Local infection of the skin and subcutaneous tissue, unspecified: Secondary | ICD-10-CM | POA: Diagnosis present

## 2023-02-14 DIAGNOSIS — G4733 Obstructive sleep apnea (adult) (pediatric): Secondary | ICD-10-CM | POA: Diagnosis present

## 2023-02-14 DIAGNOSIS — E1142 Type 2 diabetes mellitus with diabetic polyneuropathy: Secondary | ICD-10-CM

## 2023-02-14 DIAGNOSIS — Z6841 Body Mass Index (BMI) 40.0 and over, adult: Secondary | ICD-10-CM

## 2023-02-14 DIAGNOSIS — D631 Anemia in chronic kidney disease: Secondary | ICD-10-CM | POA: Diagnosis present

## 2023-02-14 DIAGNOSIS — E785 Hyperlipidemia, unspecified: Secondary | ICD-10-CM | POA: Diagnosis present

## 2023-02-14 DIAGNOSIS — N1831 Chronic kidney disease, stage 3a: Secondary | ICD-10-CM | POA: Diagnosis present

## 2023-02-14 DIAGNOSIS — Z823 Family history of stroke: Secondary | ICD-10-CM

## 2023-02-14 DIAGNOSIS — Z886 Allergy status to analgesic agent status: Secondary | ICD-10-CM

## 2023-02-14 DIAGNOSIS — N183 Chronic kidney disease, stage 3 unspecified: Secondary | ICD-10-CM | POA: Diagnosis not present

## 2023-02-14 DIAGNOSIS — E1122 Type 2 diabetes mellitus with diabetic chronic kidney disease: Secondary | ICD-10-CM | POA: Diagnosis present

## 2023-02-14 DIAGNOSIS — Z833 Family history of diabetes mellitus: Secondary | ICD-10-CM

## 2023-02-14 DIAGNOSIS — M869 Osteomyelitis, unspecified: Secondary | ICD-10-CM | POA: Diagnosis not present

## 2023-02-14 LAB — COMPREHENSIVE METABOLIC PANEL
ALT: 18 U/L (ref 0–44)
AST: 21 U/L (ref 15–41)
Albumin: 3 g/dL — ABNORMAL LOW (ref 3.5–5.0)
Alkaline Phosphatase: 80 U/L (ref 38–126)
Anion gap: 14 (ref 5–15)
BUN: 70 mg/dL — ABNORMAL HIGH (ref 8–23)
CO2: 21 mmol/L — ABNORMAL LOW (ref 22–32)
Calcium: 8.7 mg/dL — ABNORMAL LOW (ref 8.9–10.3)
Chloride: 95 mmol/L — ABNORMAL LOW (ref 98–111)
Creatinine, Ser: 2.11 mg/dL — ABNORMAL HIGH (ref 0.61–1.24)
GFR, Estimated: 32 mL/min — ABNORMAL LOW (ref >60)
Glucose, Bld: 306 mg/dL — ABNORMAL HIGH (ref 70–99)
Potassium: 4.3 mmol/L (ref 3.5–5.1)
Sodium: 130 mmol/L — ABNORMAL LOW (ref 135–145)
Total Bilirubin: 0.6 mg/dL (ref 0.3–1.2)
Total Protein: 7.5 g/dL (ref 6.5–8.1)

## 2023-02-14 LAB — CBC WITH DIFFERENTIAL/PLATELET
Abs Immature Granulocytes: 0.14 10*3/uL — ABNORMAL HIGH (ref 0.00–0.07)
Basophils Absolute: 0.1 10*3/uL (ref 0.0–0.1)
Basophils Relative: 0 %
Eosinophils Absolute: 0.1 10*3/uL (ref 0.0–0.5)
Eosinophils Relative: 1 %
HCT: 41.3 % (ref 39.0–52.0)
Hemoglobin: 13.2 g/dL (ref 13.0–17.0)
Immature Granulocytes: 1 %
Lymphocytes Relative: 10 %
Lymphs Abs: 1.3 10*3/uL (ref 0.7–4.0)
MCH: 25.5 pg — ABNORMAL LOW (ref 26.0–34.0)
MCHC: 32 g/dL (ref 30.0–36.0)
MCV: 79.7 fL — ABNORMAL LOW (ref 80.0–100.0)
Monocytes Absolute: 0.9 10*3/uL (ref 0.1–1.0)
Monocytes Relative: 7 %
Neutro Abs: 11.3 10*3/uL — ABNORMAL HIGH (ref 1.7–7.7)
Neutrophils Relative %: 81 %
Platelets: 385 10*3/uL (ref 150–400)
RBC: 5.18 MIL/uL (ref 4.22–5.81)
RDW: 15.3 % (ref 11.5–15.5)
WBC: 13.8 10*3/uL — ABNORMAL HIGH (ref 4.0–10.5)
nRBC: 0 % (ref 0.0–0.2)

## 2023-02-14 LAB — I-STAT CG4 LACTIC ACID, ED: Lactic Acid, Venous: 2.4 mmol/L (ref 0.5–1.9)

## 2023-02-14 MED ORDER — LACTATED RINGERS IV SOLN: INTRAVENOUS | Status: DC

## 2023-02-14 MED ORDER — SODIUM CHLORIDE 0.9 % IV SOLN
2.0000 g | Freq: Two times a day (BID) | INTRAVENOUS | Status: DC
  Administered 2023-02-15 – 2023-02-20 (×12): 2 g via INTRAVENOUS
  Filled 2023-02-14 (×12): qty 12.5

## 2023-02-14 MED ORDER — TIMOLOL MALEATE 0.5 % OP SOLN
1.0000 [drp] | Freq: Two times a day (BID) | OPHTHALMIC | Status: DC
  Administered 2023-02-15 – 2023-02-20 (×12): 1 [drp] via OPHTHALMIC
  Filled 2023-02-14: qty 5

## 2023-02-14 MED ORDER — LACTATED RINGERS IV BOLUS (SEPSIS)
1000.0000 mL | Freq: Once | INTRAVENOUS | Status: AC
  Administered 2023-02-14: 1000 mL via INTRAVENOUS

## 2023-02-14 MED ORDER — PANTOPRAZOLE SODIUM 40 MG PO TBEC
40.0000 mg | DELAYED_RELEASE_TABLET | Freq: Every day | ORAL | Status: DC
  Administered 2023-02-15 – 2023-02-20 (×6): 40 mg via ORAL
  Filled 2023-02-14 (×6): qty 1

## 2023-02-14 MED ORDER — METRONIDAZOLE 500 MG PO TABS
500.0000 mg | ORAL_TABLET | Freq: Two times a day (BID) | ORAL | Status: DC
  Administered 2023-02-15 – 2023-02-20 (×11): 500 mg via ORAL
  Filled 2023-02-14 (×10): qty 1

## 2023-02-14 MED ORDER — LATANOPROST 0.005 % OP SOLN
1.0000 [drp] | Freq: Every day | OPHTHALMIC | Status: DC
  Administered 2023-02-15 – 2023-02-19 (×6): 1 [drp] via OPHTHALMIC
  Filled 2023-02-14: qty 2.5

## 2023-02-14 MED ORDER — SIMVASTATIN 20 MG PO TABS
20.0000 mg | ORAL_TABLET | Freq: Every day | ORAL | Status: DC
  Administered 2023-02-15 – 2023-02-20 (×6): 20 mg via ORAL
  Filled 2023-02-14 (×6): qty 1

## 2023-02-14 MED ORDER — SODIUM CHLORIDE 0.9 % IV SOLN
2.0000 g | INTRAVENOUS | Status: DC
  Administered 2023-02-14: 2 g via INTRAVENOUS
  Filled 2023-02-14: qty 20

## 2023-02-14 MED ORDER — FENTANYL CITRATE PF 50 MCG/ML IJ SOSY: 12.5000 ug | PREFILLED_SYRINGE | INTRAMUSCULAR | Status: DC | PRN

## 2023-02-14 MED ORDER — BRIMONIDINE TARTRATE-TIMOLOL 0.2-0.5 % OP SOLN: 1.0000 [drp] | Freq: Two times a day (BID) | OPHTHALMIC | Status: DC

## 2023-02-14 MED ORDER — ACETAMINOPHEN 650 MG RE SUPP: 650.0000 mg | Freq: Four times a day (QID) | RECTAL | Status: DC | PRN

## 2023-02-14 MED ORDER — SODIUM CHLORIDE 0.9% FLUSH
3.0000 mL | Freq: Two times a day (BID) | INTRAVENOUS | Status: DC
  Administered 2023-02-15 – 2023-02-19 (×9): 3 mL via INTRAVENOUS

## 2023-02-14 MED ORDER — BRIMONIDINE TARTRATE 0.2 % OP SOLN
1.0000 [drp] | Freq: Two times a day (BID) | OPHTHALMIC | Status: DC
  Administered 2023-02-15 – 2023-02-20 (×12): 1 [drp] via OPHTHALMIC
  Filled 2023-02-14: qty 5

## 2023-02-14 MED ORDER — METRONIDAZOLE 500 MG/100ML IV SOLN
500.0000 mg | Freq: Two times a day (BID) | INTRAVENOUS | Status: DC
  Administered 2023-02-14: 500 mg via INTRAVENOUS
  Filled 2023-02-14: qty 100

## 2023-02-14 MED ORDER — SENNOSIDES-DOCUSATE SODIUM 8.6-50 MG PO TABS: 1.0000 | ORAL_TABLET | Freq: Every evening | ORAL | Status: DC | PRN

## 2023-02-14 MED ORDER — INSULIN ASPART 100 UNIT/ML IJ SOLN
0.0000 [IU] | INTRAMUSCULAR | Status: DC
  Administered 2023-02-15: 3 [IU] via SUBCUTANEOUS
  Administered 2023-02-15: 5 [IU] via SUBCUTANEOUS
  Administered 2023-02-15: 3 [IU] via SUBCUTANEOUS

## 2023-02-14 MED ORDER — LINACLOTIDE 145 MCG PO CAPS
290.0000 ug | ORAL_CAPSULE | Freq: Every day | ORAL | Status: DC
  Administered 2023-02-15 – 2023-02-20 (×6): 290 ug via ORAL
  Filled 2023-02-14 (×6): qty 2

## 2023-02-14 MED ORDER — INSULIN GLARGINE-YFGN 100 UNIT/ML ~~LOC~~ SOLN
45.0000 [IU] | Freq: Every day | SUBCUTANEOUS | Status: DC
  Administered 2023-02-15 – 2023-02-16 (×3): 45 [IU] via SUBCUTANEOUS
  Filled 2023-02-14 (×4): qty 0.45

## 2023-02-14 MED ORDER — ACETAMINOPHEN 325 MG PO TABS: 650.0000 mg | ORAL_TABLET | Freq: Four times a day (QID) | ORAL | Status: DC | PRN

## 2023-02-14 MED ORDER — OXYCODONE HCL 5 MG PO TABS: 5.0000 mg | ORAL_TABLET | ORAL | Status: DC | PRN

## 2023-02-14 NOTE — ED Notes (Signed)
2nd toe wound  dressed with vaseline gauze and coban.

## 2023-02-14 NOTE — H&P (Signed)
History and Physical    Micheal Morris ZOX:[id number] DOB: [birth year] DOA: 02/14/2023  PCP: Eartha Inch, MD   Patient coming from: Home   Chief Complaint: Right second toe infection, fever   HPI: Micheal Morris is a pleasant 77 y.o. male with medical history significant for insulin-dependent diabetes mellitus, chronic diastolic CHF, hypertension, OSA on CPAP, and right toe ulcer followed by podiatry who presents at the direction of his podiatrist for worsening right second toe infection despite oral antibiotics.  Patient reports that he has been on doxycycline for 3 weeks but his right second toe infection continues to drain and has become progressively discolored and malodorous.  He was recently given Augmentin but was seen in the podiatry clinic today and directed to the ED.  He reports that he had a fever at home 2 days ago.  ED Course: Upon arrival to the ED, patient is found to be afebrile and saturating well on room air with normal heart rate and systolic blood pressure 107 and greater.  Labs are most notable for WBC 13,800, lactic acid 2.4, glucose 306, BUN 70, and creatinine 2.11.  Blood cultures were ordered and the patient was given a liter of LR, Rocephin, and Flagyl in the ED.  Review of Systems:  All other systems reviewed and apart from HPI, are negative.  Past Medical History:  Diagnosis Date   Arthritis    "knees" (01/01/2016)   Cataract    OS   CHF (congestive heart failure) (HCC)    Chronic kidney disease    Diabetes mellitus (HCC)    Diabetic retinopathy (HCC)    NPDR OU   Difficult intubation    Erectile dysfunction    GERD (gastroesophageal reflux disease)    Glaucoma    OS   HTN (hypertension)    Hyperlipidemia    Neuromuscular disorder (HCC)    neuropathy   Obesity    OSA on CPAP    Pain of left upper arm    Restrictive lung disease    Sensory hearing loss, bilateral    Sleep apnea    SOBOE (shortness of breath on exertion) 11/03/2015    currently goes to gym   Type II diabetes mellitus (HCC)     Past Surgical History:  Procedure Laterality Date   AMPUTATION TOE Right 11/11/2022   Procedure: RIGHT AMPUTATION  of  GREAT TOE;  Surgeon: Edwin Cap, DPM;  Location: [facility];  Service: Podiatry;  Laterality: Right;   CATARACT EXTRACTION Right 03/23/2020   Dr. Chalmers Guest   COLONOSCOPY     EYE SURGERY Right 03/23/2020   Cat Sx - Dr. Chalmers Guest   GLAUCOMA SURGERY Bilateral    "laser"   JOINT REPLACEMENT Left    knee   LAPAROSCOPIC CHOLECYSTECTOMY  09/17/2018   TOTAL KNEE ARTHROPLASTY Left 11/09/2015   Procedure: LEFT TOTAL KNEE ARTHROPLASTY;  Surgeon: Ollen Gross, MD;  Location: [facility];  Service: Orthopedics;  Laterality: Left;    Social History:   reports that he has never smoked. He has never used smokeless tobacco. He reports that he does not drink alcohol and does not use drugs.  Allergies  Allergen Reactions   Nsaids Other (See Comments)    GI BLEED   Aspirin Other (See Comments)    STOMACH ULCER  STOMACH ULCER  STOMACH ULCER  STOMACH ULCER  STOMACH ULCER  STOMACH ULCER  STOMACH ULCER    Other Other (See Comments)    blisters  Family History  Problem Relation Age of Onset   Stroke Mother    Diabetes Mother    Hypertension Mother    Heart disease Mother    Hypertension Brother    Diabetes Brother    Diabetes Sister      Prior to Admission medications   Medication Sig Start Date End Date Taking? Authorizing Provider  fluticasone (FLONASE) 50 MCG/ACT nasal spray Place 1 spray into both nostrils daily. 01/10/23  Yes [provider]  levocetirizine (XYZAL) 5 MG tablet Take 5 mg by mouth every evening. 01/10/23  Yes [provider]  NOVOLOG FLEXPEN 100 UNIT/ML FlexPen Inject 15 Units/kg into the skin 3 (three) times daily with meals. 10/22/19  Yes [provider]  acetaminophen (TYLENOL) 325 MG tablet Take 2 tablets (650 mg total) by mouth every 6 (six) hours as  needed for mild pain, fever or headache. 11/13/22   Lonia Blood, MD  amLODipine-benazepril (LOTREL) 10-20 MG capsule Take 1 capsule by mouth daily.    [provider]  amoxicillin-clavulanate (AUGMENTIN) 875-125 MG tablet Take 1 tablet by mouth 2 (two) times daily. 02/10/23   McCaughan, Dia D, DPM  aspirin 81 MG chewable tablet Chew 81 mg by mouth daily.    [provider]  BD PEN NEEDLE NANO U/F 32G X 4 MM MISC 1 each by Other route in the morning, at noon, in the evening, and at bedtime. 03/30/18   [provider]  bimatoprost (LUMIGAN) 0.01 % SOLN Place 1 drop into both eyes at bedtime. 09/16/14   [provider]  Blood Glucose Monitoring Suppl (ACCU-CHEK GUIDE) w/Device KIT 1 each by Other route 4 (four) times daily. 08/29/19   [provider]  brimonidine-timolol (COMBIGAN) 0.2-0.5 % ophthalmic solution Place 1 drop into both eyes 2 (two) times daily. 04/12/15   [provider]  collagenase (SANTYL) 250 UNIT/GM ointment Apply 1 Application topically daily. 01/31/23   McCaughan, Dia D, DPM  docusate sodium (COLACE) 100 MG capsule Take 100 mg by mouth 2 (two) times daily.    [provider]  gabapentin (NEURONTIN) 300 MG capsule Take 300 mg by mouth 2 (two) times daily.    [provider]  gentamicin ointment (GARAMYCIN) 0.1 % Apply 1 Application topically daily. 11/30/22   McDonald, Adam R, DPM  HUMALOG KWIKPEN 100 UNIT/ML KiwkPen Inject 0-60 Units into the skin 3 (three) times daily. Per sliding scale three times daily with meals 03/13/18   [provider]  JARDIANCE 25 MG TABS tablet Take 25 mg by mouth daily. 02/21/18   [provider]  Lancets (ONETOUCH ULTRASOFT) lancets 1 each by Other route in the morning, at noon, in the evening, and at bedtime. 06/28/13   [provider]  LANTUS SOLOSTAR 100 UNIT/ML Solostar Pen Inject 90 Units into the skin daily. 04/16/20   [provider]   levofloxacin (LEVAQUIN) 750 MG tablet Take 1 tablet (750 mg total) by mouth daily. 11/14/22   Lonia Blood, MD  linaclotide (LINZESS) 290 MCG CAPS capsule Take 290 mcg by mouth daily before breakfast.    [provider]  liraglutide (VICTOZA) 18 MG/3ML SOPN Inject 1.8 mg into the skin daily.    [provider]  LOTEMAX 0.5 % GEL Apply 1 drop to eye 3 (three) times daily. 12/29/21   [provider]  metoprolol succinate (TOPROL-XL) 100 MG 24 hr tablet Take 100 mg by mouth every evening. 04/14/20   [provider]  mirabegron ER (  MYRBETRIQ) 50 MG TB24 tablet Take 50 mg by mouth daily. 09/27/19   [provider]  Multiple Vitamin (MULTIVITAMIN) tablet Take 1 tablet by mouth daily. Centrum silver    [provider]  nutrition supplement, JUVEN, (JUVEN) PACK Take 1 packet by mouth 2 (two) times daily between meals. 11/13/22   Lonia Blood, MD  ondansetron (ZOFRAN) 4 MG tablet Take 1 tablet (4 mg total) by mouth every 6 (six) hours as needed for nausea or vomiting. 12/03/22   Glyn Ade, MD  ONE TOUCH ULTRA TEST test strip 1 each by Other route in the morning, at noon, in the evening, and at bedtime. 03/12/18   [provider]  pantoprazole (PROTONIX) 40 MG tablet Take 40 mg by mouth daily. 11/15/21   [provider]  polyethylene glycol (MIRALAX) 17 g packet Take 17 g by mouth daily. 12/03/22   Rancour, Jeannett Senior, MD  Probiotic Product [facility] COLON HEALTH) CAPS Take 1 capsule by mouth in the morning and at bedtime.    [provider]  silver sulfADIAZINE (SILVADENE) 1 % cream Apply 1 Application topically daily. 10/27/22   McDonald, Rachelle Hora, DPM  simvastatin (ZOCOR) 20 MG tablet Take 20 mg by mouth daily. 10/23/19   [provider]  spironolactone (ALDACTONE) 25 MG tablet Take 25 mg by mouth daily.  01/30/18   [provider]  torsemide (DEMADEX) 20 MG tablet Take 20 mg by mouth daily. 09/27/19    [provider]  TURMERIC PO Take 400 mg by mouth daily.    [provider]  vardenafil (LEVITRA) 20 MG tablet Take 20 mg by mouth daily as needed for erectile dysfunction. 08/07/12   [provider]    Physical Exam: Vitals:   02/14/23 2215 02/14/23 2230 02/14/23 2245 02/14/23 2300  BP: 127/63 129/63 122/68 128/65  Pulse: 71 70 69 72  Resp: 12 12 14 10   Temp:      TempSrc:      SpO2: 100% 100% 100% 100%  Weight:      Height:         Constitutional: NAD, calm  Eyes: PERTLA, lids and conjunctivae normal ENMT: Mucous membranes are moist. Posterior pharynx clear of any exudate or lesions.   Neck: supple, no masses  Respiratory: no wheezing, no crackles. No accessory muscle use.  Cardiovascular: S1 & S2 heard, regular rate and rhythm. Bilateral lower extremity edema.  Abdomen: no tenderness, soft. Bowel sounds active.  Musculoskeletal: no clubbing / cyanosis. No joint deformity upper and lower extremities.   Skin: Right second toe discolored, malodorous, and draining. Skin otherwise warm, dry, well-perfused. Neurologic: CN 2-12 grossly intact. Moving all extremities. Alert and oriented.  Psychiatric: Pleasant. Cooperative.    Labs and Imaging on Admission: I have personally reviewed following labs and imaging studies  CBC: Recent Labs  Lab 02/14/23 1904  WBC 13.8*  NEUTROABS 11.3*  HGB 13.2  HCT 41.3  MCV 79.7*  PLT 385   Basic Metabolic Panel: Recent Labs  Lab 02/14/23 1904  NA 130*  K 4.3  CL 95*  CO2 21*  GLUCOSE 306*  BUN 70*  CREATININE 2.11*  CALCIUM 8.7*   GFR: Estimated Creatinine Clearance: 39.3 mL/min (A) (by C-G formula based on SCr of 2.11 mg/dL (H)). Liver Function Tests: Recent Labs  Lab 02/14/23 1904  AST 21  ALT 18  ALKPHOS 80  BILITOT 0.6  PROT 7.5  ALBUMIN 3.0*   No results for input(s): "LIPASE", "AMYLASE" in the last  168 hours. No results for input(s): "AMMONIA" in the last 168 hours. Coagulation  Profile: No results for input(s): "INR", "PROTIME" in the last 168 hours. Cardiac Enzymes: No results for input(s): "CKTOTAL", "CKMB", "CKMBINDEX", "TROPONINI" in the last 168 hours. BNP (last 3 results) No results for input(s): "PROBNP" in the last 8760 hours. HbA1C: No results for input(s): "HGBA1C" in the last 72 hours. CBG: No results for input(s): "GLUCAP" in the last 168 hours. Lipid Profile: No results for input(s): "CHOL", "HDL", "LDLCALC", "TRIG", "CHOLHDL", "LDLDIRECT" in the last 72 hours. Thyroid Function Tests: No results for input(s): "TSH", "T4TOTAL", "FREET4", "T3FREE", "THYROIDAB" in the last 72 hours. Anemia Panel: No results for input(s): "VITAMINB12", "FOLATE", "FERRITIN", "TIBC", "IRON", "RETICCTPCT" in the last 72 hours. Urine analysis:    Component Value Date/Time   COLORURINE COLORLESS (A) 12/03/2022 1022   APPEARANCEUR CLEAR 12/03/2022 1022   LABSPEC 1.019 12/03/2022 1022   PHURINE 5.5 12/03/2022 1022   GLUCOSEU >1,000 (A) 12/03/2022 1022   HGBUR TRACE (A) 12/03/2022 1022   BILIRUBINUR NEGATIVE 12/03/2022 1022   KETONESUR 15 (A) 12/03/2022 1022   PROTEINUR TRACE (A) 12/03/2022 1022   NITRITE NEGATIVE 12/03/2022 1022   LEUKOCYTESUR NEGATIVE 12/03/2022 1022   Sepsis Labs: @LABRCNTIP (procalcitonin:4,lacticidven:4) )No results found for this or any previous visit (from the past 240 hour(s)).   Radiological Exams on Admission: No results found.   Assessment/Plan   1. 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 Chronic  HFpEF  - Appears compensated  - Hold diuretics and ACE-i in setting of AKI, monitor weight and I/Os   4. Insulin-dependent DM  - A1c was 7.8% in April 2024  - Check CBGs, continue long- and short-acting insulin    5. Hypertension  - BP low-normal in ED and antihypertensives held initially    6. OSA  - CPAP at bedtime    DVT prophylaxis: SCDs  Code Status: Full  Level of Care: Level of care: Telemetry Medical Family Communication: wife at bedside  Disposition Plan:  Patient is from: Home  Anticipated d/c is to: Home  Anticipated d/c date is: 02/18/23  Patient currently: Pending podiatry evaluation and likely surgery this admission  Consults called: None  Admission status: Inpatient     Briscoe Deutscher, MD [facility]  02/14/2023, 11:13 PM

## 2023-02-14 NOTE — ED Notes (Signed)
ED TO INPATIENT HANDOFF REPORT  ED Nurse Name and Phone #: Amil Amen [phone]  S Name/Age/Gender Micheal Morris 77 y.o. male Room/Bed: 001C/001C  Code Status   Code Status: Prior  Home/SNF/Other Home Patient oriented to: self, place, time, and situation Is this baseline? Yes   Triage Complete: Triage complete  Chief Complaint Diabetic foot infection (HCC) [J62.831, L08.9]  Triage Note Pt had R great toe removed 4/12; 2nd toe now gangrenous; pt sent by podiatry for further eval; hx dm, htn; pt endorses fevers at home; denies pain currently; taking tylenol at home; x rays done today   Allergies Allergies  Allergen Reactions   Nsaids Other (See Comments)    GI BLEED   Aspirin Other (See Comments)    STOMACH ULCER  STOMACH ULCER  STOMACH ULCER  STOMACH ULCER  STOMACH ULCER  STOMACH ULCER  STOMACH ULCER    Other Other (See Comments)    blisters    Level of Care/Admitting Diagnosis ED Disposition     ED Disposition  Admit   Condition  --   Comment  Hospital Area: [facility] [[id number]]  Level of Care: Telemetry Medical [104]  May admit patient to [facility] or [facility] if equivalent level of care is available:: No  Covid Evaluation: Asymptomatic - no recent exposure (last 10 days) testing not required  Diagnosis: Diabetic foot infection [facility]) [[id number]]  Admitting Physician: Briscoe Deutscher [[id number]]  Attending Physician: Briscoe Deutscher [[id number]]  Certification:: I certify this patient will need inpatient services for at least 2 midnights  Estimated Length of Stay: 4          B Medical/Surgery History Past Medical History:  Diagnosis Date   Arthritis    "knees" (01/01/2016)   Cataract    OS   CHF (congestive heart failure) (HCC)    Chronic kidney disease    Diabetes mellitus (HCC)    Diabetic retinopathy (HCC)    NPDR OU   Difficult intubation    Erectile dysfunction    GERD (gastroesophageal reflux disease)    Glaucoma    OS    HTN (hypertension)    Hyperlipidemia    Neuromuscular disorder (HCC)    neuropathy   Obesity    OSA on CPAP    Pain of left upper arm    Restrictive lung disease    Sensory hearing loss, bilateral    Sleep apnea    SOBOE (shortness of breath on exertion) 11/03/2015   currently goes to gym   Type II diabetes mellitus (HCC)    Past Surgical History:  Procedure Laterality Date   AMPUTATION TOE Right 11/11/2022   Procedure: RIGHT AMPUTATION  of  GREAT TOE;  Surgeon: Edwin Cap, DPM;  Location: [facility] OR;  Service: Podiatry;  Laterality: Right;   CATARACT EXTRACTION Right 03/23/2020   Dr. Chalmers Guest   COLONOSCOPY     EYE SURGERY Right 03/23/2020   Cat Sx - Dr. Chalmers Guest   GLAUCOMA SURGERY Bilateral    "laser"   JOINT REPLACEMENT Left    knee   LAPAROSCOPIC CHOLECYSTECTOMY  09/17/2018   TOTAL KNEE ARTHROPLASTY Left 11/09/2015   Procedure: LEFT TOTAL KNEE ARTHROPLASTY;  Surgeon: Ollen Gross, MD;  Location: [facility];  Service: Orthopedics;  Laterality: Left;     A IV Location/Drains/Wounds Patient Lines/Drains/Airways Status     Active Line/Drains/Airways     Name Placement date Placement time Site Days   Peripheral IV 02/14/23 20 G Anterior;Right Forearm  02/14/23  2135  Forearm  less than 1            Intake/Output Last 24 hours No intake or output data in the 24 hours ending 02/14/23 2241  Labs/Imaging Results for orders placed or performed during the hospital encounter of 02/14/23 (from the past 48 hour(s))  Comprehensive metabolic panel     Status: Abnormal   Collection Time: 02/14/23  7:04 PM  Result Value Ref Range   Sodium 130 (L) 135 - 145 mmol/L   Potassium 4.3 3.5 - 5.1 mmol/L   Chloride 95 (L) 98 - 111 mmol/L   CO2 21 (L) 22 - 32 mmol/L   Glucose, Bld 306 (H) 70 - 99 mg/dL    Comment: Glucose reference range applies only to samples taken after fasting for at least 8 hours.   BUN 70 (H) 8 - 23 mg/dL   Creatinine, Ser 1.61 (H) 0.61 - 1.24 mg/dL    Calcium 8.7 (L) 8.9 - 10.3 mg/dL   Total Protein 7.5 6.5 - 8.1 g/dL   Albumin 3.0 (L) 3.5 - 5.0 g/dL   AST 21 15 - 41 U/L   ALT 18 0 - 44 U/L   Alkaline Phosphatase 80 38 - 126 U/L   Total Bilirubin 0.6 0.3 - 1.2 mg/dL   GFR, Estimated 32 (L) >60 mL/min    Comment: (NOTE) Calculated using the CKD-EPI Creatinine Equation (2021)    Anion gap 14 5 - 15    Comment: Performed at [facility], [address], [place], [place] [address]  CBC with Differential     Status: Abnormal   Collection Time: 02/14/23  7:04 PM  Result Value Ref Range   WBC 13.8 (H) 4.0 - 10.5 K/uL   RBC 5.18 4.22 - 5.81 MIL/uL   Hemoglobin 13.2 13.0 - 17.0 g/dL   HCT 54.0 98.1 - 19.1 %   MCV 79.7 (L) 80.0 - 100.0 fL   MCH 25.5 (L) 26.0 - 34.0 pg   MCHC 32.0 30.0 - 36.0 g/dL   RDW 47.8 29.5 - 62.1 %   Platelets 385 150 - 400 K/uL   nRBC 0.0 0.0 - 0.2 %   Neutrophils Relative % 81 %   Neutro Abs 11.3 (H) 1.7 - 7.7 K/uL   Lymphocytes Relative 10 %   Lymphs Abs 1.3 0.7 - 4.0 K/uL   Monocytes Relative 7 %   Monocytes Absolute 0.9 0.1 - 1.0 K/uL   Eosinophils Relative 1 %   Eosinophils Absolute 0.1 0.0 - 0.5 K/uL   Basophils Relative 0 %   Basophils Absolute 0.1 0.0 - 0.1 K/uL   Immature Granulocytes 1 %   Abs Immature Granulocytes 0.14 (H) 0.00 - 0.07 K/uL    Comment: Performed at [facility], [address] [place], [place] [address]  I-Stat Lactic Acid, ED     Status: Abnormal   Collection Time: 02/14/23  7:09 PM  Result Value Ref Range   Lactic Acid, Venous 2.4 (HH) 0.5 - 1.9 mmol/L   No results found.  Pending Labs Unresulted Labs (From admission, onward)     Start     Ordered   02/14/23 2111  Blood Cultures x 2 sites  BLOOD CULTURE X 2,   STAT      02/14/23 2111            Vitals/Pain Today's Vitals   02/14/23 2158 02/14/23 2200 02/14/23 2215 02/14/23 2230  BP: 119/63 115/64 127/63 129/63  Pulse: 73 71 71 70  Resp: 14 11 12 12   Temp: 98.4 F (36.9 C)     TempSrc: Oral      SpO2: 100% 100% 100% 100%  Weight:      Height:      PainSc: 0-No pain       Isolation Precautions No active isolations  Medications Medications  lactated ringers infusion (has no administration in time range)  lactated ringers bolus 1,000 mL (1,000 mLs Intravenous New Bag/Given 02/14/23 2152)  cefTRIAXone (ROCEPHIN) 2 g in sodium chloride 0.9 % 100 mL IVPB (0 g Intravenous Stopped 02/14/23 2231)    And  metroNIDAZOLE (FLAGYL) IVPB 500 mg (500 mg Intravenous New Bag/Given 02/14/23 2231)    Mobility non-ambulatory     Focused Assessments Wound rt 2nd toe gangrene   R Recommendations: See Admitting Provider Note  Report given to:   Additional Notes: A+Ox4,

## 2023-02-14 NOTE — ED Provider Notes (Signed)
[place] EMERGENCY DEPARTMENT AT [facility] Provider Note   CSN: [id number] Arrival date & time: 02/14/23  1829     History {Add pertinent medical, surgical, social history, OB history to HPI:1} Chief Complaint  Patient presents with  . Foot Pain    Micheal Morris is a 77 y.o. male.  The history is provided by the patient, the spouse and medical records.  Foot Pain      Home Medications Prior to Admission medications   Medication Sig Start Date End Date Taking? Authorizing Provider  fluticasone (FLONASE) 50 MCG/ACT nasal spray Place 1 spray into both nostrils daily. 01/10/23  Yes [provider]  levocetirizine (XYZAL) 5 MG tablet Take 5 mg by mouth every evening. 01/10/23  Yes [provider]  NOVOLOG FLEXPEN 100 UNIT/ML FlexPen Inject 15 Units/kg into the skin 3 (three) times daily with meals. 10/22/19  Yes [provider]  acetaminophen (TYLENOL) 325 MG tablet Take 2 tablets (650 mg total) by mouth every 6 (six) hours as needed for mild pain, fever or headache. 11/13/22   Lonia Blood, MD  amLODipine-benazepril (LOTREL) 10-20 MG capsule Take 1 capsule by mouth daily.    [provider]  amoxicillin-clavulanate (AUGMENTIN) 875-125 MG tablet Take 1 tablet by mouth 2 (two) times daily. 02/10/23   McCaughan, Dia D, DPM  aspirin 81 MG chewable tablet Chew 81 mg by mouth daily.    [provider]  BD PEN NEEDLE NANO U/F 32G X 4 MM MISC 1 each by Other route in the morning, at noon, in the evening, and at bedtime. 03/30/18   [provider]  bimatoprost (LUMIGAN) 0.01 % SOLN Place 1 drop into both eyes at bedtime. 09/16/14   [provider]  Blood Glucose Monitoring Suppl (ACCU-CHEK GUIDE) w/Device KIT 1 each by Other route 4 (four) times daily. 08/29/19   [provider]  brimonidine-timolol (COMBIGAN) 0.2-0.5 % ophthalmic solution Place 1 drop into both eyes 2 (two) times daily. 04/12/15   [provider]  collagenase (SANTYL) 250 UNIT/GM ointment Apply 1 Application topically daily. 01/31/23   McCaughan, Dia D, DPM  docusate sodium (COLACE) 100 MG capsule Take 100 mg by mouth 2 (two) times daily.    [provider]  gabapentin (NEURONTIN) 300 MG capsule Take 300 mg by mouth 2 (two) times daily.    [provider]  gentamicin ointment (GARAMYCIN) 0.1 % Apply 1 Application topically daily. 11/30/22   McDonald, Adam R, DPM  HUMALOG KWIKPEN 100 UNIT/ML KiwkPen Inject 0-60 Units into the skin 3 (three) times daily. Per sliding scale three times daily with meals 03/13/18   [provider]  JARDIANCE 25 MG TABS tablet Take 25 mg by mouth daily. 02/21/18   [provider]  Lancets (ONETOUCH ULTRASOFT) lancets 1 each by Other route in the morning, at noon, in the evening, and at bedtime. 06/28/13   [provider]  LANTUS SOLOSTAR 100 UNIT/ML Solostar Pen Inject 90 Units into the skin daily. 04/16/20   [provider]  levofloxacin (LEVAQUIN) 750 MG tablet Take 1 tablet (750 mg total) by mouth daily. 11/14/22   Lonia Blood, MD  linaclotide (LINZESS) 290 MCG CAPS capsule Take 290 mcg by mouth daily before breakfast.    [provider]  liraglutide (VICTOZA) 18 MG/3ML SOPN Inject 1.8 mg into the skin daily.    [provider]  LOTEMAX 0.5 % GEL Apply 1 drop to eye 3 (three) times daily.  12/29/21   [provider]  metoprolol succinate (TOPROL-XL) 100 MG 24 hr tablet Take 100 mg by mouth every evening. 04/14/20   [provider]  mirabegron ER (MYRBETRIQ) 50 MG TB24 tablet Take 50 mg by mouth daily. 09/27/19   [provider]  Multiple Vitamin (MULTIVITAMIN) tablet Take 1 tablet by mouth daily. Centrum silver    [provider]  nutrition supplement, JUVEN, (JUVEN) PACK Take 1 packet by mouth 2 (two) times daily between meals. 11/13/22   Lonia Blood, MD  ondansetron (ZOFRAN) 4 MG tablet  Take 1 tablet (4 mg total) by mouth every 6 (six) hours as needed for nausea or vomiting. 12/03/22   Glyn Ade, MD  ONE TOUCH ULTRA TEST test strip 1 each by Other route in the morning, at noon, in the evening, and at bedtime. 03/12/18   [provider]  pantoprazole (PROTONIX) 40 MG tablet Take 40 mg by mouth daily. 11/15/21   [provider]  polyethylene glycol (MIRALAX) 17 g packet Take 17 g by mouth daily. 12/03/22   Rancour, Jeannett Senior, MD  Probiotic Product [facility]) CAPS Take 1 capsule by mouth in the morning and at bedtime.    [provider]  silver sulfADIAZINE (SILVADENE) 1 % cream Apply 1 Application topically daily. 10/27/22   McDonald, Rachelle Hora, DPM  simvastatin (ZOCOR) 20 MG tablet Take 20 mg by mouth daily. 10/23/19   [provider]  spironolactone (ALDACTONE) 25 MG tablet Take 25 mg by mouth daily.  01/30/18   [provider]  torsemide (DEMADEX) 20 MG tablet Take 20 mg by mouth daily. 09/27/19   [provider]  TURMERIC PO Take 400 mg by mouth daily.    [provider]  vardenafil (LEVITRA) 20 MG tablet Take 20 mg by mouth daily as needed for erectile dysfunction. 08/07/12   [provider]      Allergies    Nsaids, Aspirin, and Other    Review of Systems   Review of Systems  Physical Exam Updated Vital Signs BP (!) 107/57   Pulse 86   Temp 97.7 F (36.5 C)   Resp 16   SpO2 97%  Physical Exam  ED Results / Procedures / Treatments   Labs (all labs ordered are listed, but only abnormal results are displayed) Labs Reviewed  COMPREHENSIVE METABOLIC PANEL - Abnormal; Notable for the following components:      Result Value   Sodium 130 (*)    Chloride 95 (*)    CO2 21 (*)    Glucose, Bld 306 (*)    BUN 70 (*)    Creatinine, Ser 2.11 (*)    Calcium 8.7 (*)    Albumin 3.0 (*)    GFR, Estimated 32 (*)    All other components within normal limits  CBC WITH DIFFERENTIAL/PLATELET -  Abnormal; Notable for the following components:   WBC 13.8 (*)    MCV 79.7 (*)    MCH 25.5 (*)    Neutro Abs 11.3 (*)    Abs Immature Granulocytes 0.14 (*)    All other components within normal limits  I-STAT CG4 LACTIC ACID, ED - Abnormal; Notable for the following components:   Lactic Acid, Venous 2.4 (*)    All other components within normal limits  CULTURE, BLOOD (ROUTINE X 2)  CULTURE, BLOOD (ROUTINE X 2)  I-STAT CG4 LACTIC ACID, ED    EKG None  Radiology No results found.  Procedures Procedures  {Document  cardiac monitor, telemetry assessment procedure when appropriate:1}  Medications Ordered in ED Medications  lactated ringers infusion (has no administration in time range)  lactated ringers bolus 1,000 mL (has no administration in time range)  cefTRIAXone (ROCEPHIN) 2 g in sodium chloride 0.9 % 100 mL IVPB (has no administration in time range)    And  metroNIDAZOLE (FLAGYL) IVPB 500 mg (has no administration in time range)    ED Course/ Medical Decision Making/ A&P   {   Click here for ABCD2, HEART and other calculatorsREFRESH Note before signing :1}                          Medical Decision Making Amount and/or Complexity of Data Reviewed Labs: ordered.  Risk Prescription drug management.   ***  {Document critical care time when appropriate:1} {Document review of labs and clinical decision tools ie heart score, Chads2Vasc2 etc:1}  {Document your independent review of radiology images, and any outside records:1} {Document your discussion with family members, caretakers, and with consultants:1} {Document social determinants of health affecting pt's care:1} {Document your decision making why or why not admission, treatments were needed:1} Final Clinical Impression(s) / ED Diagnoses Final diagnoses:  None    Rx / DC Orders ED Discharge Orders     None

## 2023-02-14 NOTE — Progress Notes (Signed)
Chief Complaint  Patient presents with   Foot Swelling   HPI: 77 y.o. male presenting today with his wife for follow-up of right distal second toe ulceration.  He had been on doxycycline since his last visit but ran out at the end of last week.  Augmentin had been called in but they were not aware it would have been sent to the pharmacy.  He has not been on oral antibiotics for the past few days.  Towards the end of the appointment today his wife stated that he has recently had a temperature of 102 F, and she has been giving him Tylenol to help keep his temperature under control.  She does note that there is an odor to the toe.  Also notes that there has been increased drainage.  He had a previous right hallux amputation by Dr. Lilian Kapur this year.  Past Medical History:  Diagnosis Date   Arthritis    "knees" (01/01/2016)   Cataract    OS   CHF (congestive heart failure) (HCC)    Chronic kidney disease    Diabetes mellitus (HCC)    Diabetic retinopathy (HCC)    NPDR OU   Difficult intubation    Erectile dysfunction    GERD (gastroesophageal reflux disease)    Glaucoma    OS   HTN (hypertension)    Hyperlipidemia    Neuromuscular disorder (HCC)    neuropathy   Obesity    OSA on CPAP    Pain of left upper arm    Restrictive lung disease    Sensory hearing loss, bilateral    Sleep apnea    SOBOE (shortness of breath on exertion) 11/03/2015   currently goes to gym   Type II diabetes mellitus (HCC)     Past Surgical History:  Procedure Laterality Date   AMPUTATION TOE Right 11/11/2022   Procedure: RIGHT AMPUTATION  of  GREAT TOE;  Surgeon: Edwin Cap, DPM;  Location: [facility] OR;  Service: Podiatry;  Laterality: Right;   CATARACT EXTRACTION Right 03/23/2020   Dr. Chalmers Guest   COLONOSCOPY     EYE SURGERY Right 03/23/2020   Cat Sx - Dr. Chalmers Guest   GLAUCOMA SURGERY Bilateral    "laser"   JOINT REPLACEMENT Left    knee   LAPAROSCOPIC CHOLECYSTECTOMY  09/17/2018    TOTAL KNEE ARTHROPLASTY Left 11/09/2015   Procedure: LEFT TOTAL KNEE ARTHROPLASTY;  Surgeon: Ollen Gross, MD;  Location: [facility] ORS;  Service: Orthopedics;  Laterality: Left;    Allergies  Allergen Reactions   Nsaids Other (See Comments)    GI BLEED   Aspirin Other (See Comments)    STOMACH ULCER  STOMACH ULCER  STOMACH ULCER  STOMACH ULCER  STOMACH ULCER  STOMACH ULCER  STOMACH ULCER    Other Other (See Comments)    blisters   Smoker?: non-smoker  Review of Systems  Constitutional:  Positive for fever.       Fever noted by patient, vitals not obtained today in office.  Skin:        Ulcer and swelling, right 2nd toe     PHYSICAL EXAM: There were no vitals filed for this visit.  General: The patient is alert and oriented x3 in no acute distress.  Dermatology: Right second toe is edematous.  The distal aspect of the toe has a large necrotic eschar which is formed with a full-thickness ulceration at the distal aspect of the toe.  There is a foul  odor noted.  No purulence is expressed.    Wound 1 description:  Location: Distal right second toe    Depth: To bone, using sterile probe    Wound Border: Necrotic eschar and hyperkeratosis    Odor?:  Yes, foul    Surrounding Tissue: Edematous and hyperpigmented    Infected?:  Yes    Necrosis?:  Yes distally    Pain?:  Minimal, peripheral neuropathy noted    Tunneling: Tunnels proximally to middle phalanx    Dimensions (cm): 0.8 cm x 0.6 cm x 1.5 cm deep  Vascular: Pedal pulses are diminished  Neurological: Light touch sensation diminished right foot   Musculoskeletal Exam: Previous right hallux amputation right foot.  Long second toe right foot. contracture right second toe DIP joint     Latest Ref Rng & Units 11/10/2022    5:52 PM  Hemoglobin A1C  Hemoglobin-A1c 4.8 - 5.6 % 7.8    RADIOGRAPHIC EXAM (right foot, 3 views, 02/14/2023):  The distal 50% of the right second toe distal phalanx shows osteolysis.  No fracture is  noted.  The distal right third toe was looking suspicious on DP view, however on oblique view the distal phalanx is intact.  Contracture at the DIP joint noted of second toe.  Increase in soft tissue density and volume of the second toe.  There is a gauze dressing on the toe for the x-ray exam.  ASSESSMENT / PLAN OF CARE: 1. Ulcer of right second toe with fat layer exposed (HCC)   2. 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 Type 2 diabetes mellitus with right diabetic foot infection (HCC)     Deep culture swabs of the right second toe wound was obtained today.  The ulceration was sharply debrided of hyperkeratotic and devitalized soft tissue with sterile #312 blade to the level of distal phalanx right second toe.  Hemostasis obtained.  Antibiotic ointment and DSD applied.    A long discussion ensued with the patient and his wife.  The x-ray is concerning for osteomyelitis due to the clinical presentation and the osteolysis seen at the distal phalanx of the right second toe combined with the foul odor noted today and worsening of the ulceration even though he has been on doxycycline for the past couple of weeks.  His recent fever also indicates that he is beginning to have systemic signs of infection and this needs urgently treated.  Recommended that they go to the emergency department at [facility] for admission, IV antibiotics, MRI of the right foot, and infectious disease consultation.  They were in agreement.  Our podiatrist on-call was notified and will be expecting the patient.    The patient would like to try to save the toe with long-term antibiotics if possible, but due to the poor viability of appearance of the distal aspect of the toe today, I am not sure that is a feasible option for him.   Clerance Lav, DPM, FACFAS [facility]     [address] [place], [place] [address]                Office [phone]  Fax  [phone]

## 2023-02-14 NOTE — Sepsis Progress Note (Signed)
 Following for sepsis monitoring ?

## 2023-02-14 NOTE — ED Triage Notes (Addendum)
Pt had R great toe removed 4/12; 2nd toe now gangrenous; pt sent by podiatry for further eval; hx dm, htn; pt endorses fevers at home; denies pain currently; taking tylenol at home; x rays done today

## 2023-02-14 NOTE — Progress Notes (Signed)
Pharmacy Antibiotic Note  Micheal Morris is a 77 y.o. male admitted on 02/14/2023 with R foot gangrene, h/o Pseudomonas infection.  Pharmacy has been consulted for Cefepime dosing.  Plan: Cefepime 2 g IV q12h  Height: 5\' 9"  (175.3 cm) Weight: 123.4 kg (272 lb) IBW/kg (Calculated) : 70.7  Temp (24hrs), Avg:98.1 F (36.7 C), Min:97.7 F (36.5 C), Max:98.4 F (36.9 C)  Recent Labs  Lab 02/14/23 1904 02/14/23 1909  WBC 13.8*  --   CREATININE 2.11*  --   LATICACIDVEN  --  2.4*    Estimated Creatinine Clearance: 39.3 mL/min (A) (by C-G formula based on SCr of 2.11 mg/dL (H)).    Allergies  Allergen Reactions   Nsaids Other (See Comments)    GI BLEED   Aspirin Other (See Comments)    STOMACH ULCER  STOMACH ULCER  STOMACH ULCER  STOMACH ULCER  STOMACH ULCER  STOMACH ULCER  STOMACH ULCER    Other Other (See Comments)    blisters     Eddie Candle 02/14/2023 11:28 PM

## 2023-02-15 ENCOUNTER — Inpatient Hospital Stay (HOSPITAL_COMMUNITY): Payer: Medicare PPO

## 2023-02-15 DIAGNOSIS — L089 Local infection of the skin and subcutaneous tissue, unspecified: Secondary | ICD-10-CM | POA: Diagnosis not present

## 2023-02-15 DIAGNOSIS — N179 Acute kidney failure, unspecified: Secondary | ICD-10-CM | POA: Diagnosis not present

## 2023-02-15 DIAGNOSIS — E1165 Type 2 diabetes mellitus with hyperglycemia: Secondary | ICD-10-CM | POA: Diagnosis not present

## 2023-02-15 DIAGNOSIS — L03119 Cellulitis of unspecified part of limb: Principal | ICD-10-CM

## 2023-02-15 DIAGNOSIS — E11628 Type 2 diabetes mellitus with other skin complications: Secondary | ICD-10-CM | POA: Diagnosis not present

## 2023-02-15 LAB — CBC
HCT: 37.8 % — ABNORMAL LOW (ref 39.0–52.0)
Hemoglobin: 11.8 g/dL — ABNORMAL LOW (ref 13.0–17.0)
MCH: 25.1 pg — ABNORMAL LOW (ref 26.0–34.0)
MCHC: 31.2 g/dL (ref 30.0–36.0)
MCV: 80.4 fL (ref 80.0–100.0)
Platelets: 382 10*3/uL (ref 150–400)
RBC: 4.7 MIL/uL (ref 4.22–5.81)
RDW: 15.2 % (ref 11.5–15.5)
WBC: 14.7 10*3/uL — ABNORMAL HIGH (ref 4.0–10.5)
nRBC: 0 % (ref 0.0–0.2)

## 2023-02-15 LAB — GLUCOSE, CAPILLARY
Glucose-Capillary: 103 mg/dL — ABNORMAL HIGH (ref 70–99)
Glucose-Capillary: 172 mg/dL — ABNORMAL HIGH (ref 70–99)
Glucose-Capillary: 173 mg/dL — ABNORMAL HIGH (ref 70–99)
Glucose-Capillary: 197 mg/dL — ABNORMAL HIGH (ref 70–99)
Glucose-Capillary: 209 mg/dL — ABNORMAL HIGH (ref 70–99)
Glucose-Capillary: 219 mg/dL — ABNORMAL HIGH (ref 70–99)

## 2023-02-15 LAB — BASIC METABOLIC PANEL
Anion gap: 12 (ref 5–15)
BUN: 65 mg/dL — ABNORMAL HIGH (ref 8–23)
CO2: 24 mmol/L (ref 22–32)
Calcium: 8.6 mg/dL — ABNORMAL LOW (ref 8.9–10.3)
Chloride: 99 mmol/L (ref 98–111)
Creatinine, Ser: 2.02 mg/dL — ABNORMAL HIGH (ref 0.61–1.24)
GFR, Estimated: 34 mL/min — ABNORMAL LOW (ref >60)
Glucose, Bld: 241 mg/dL — ABNORMAL HIGH (ref 70–99)
Potassium: 4.2 mmol/L (ref 3.5–5.1)
Sodium: 135 mmol/L (ref 135–145)

## 2023-02-15 LAB — PREALBUMIN: Prealbumin: 8 mg/dL — ABNORMAL LOW (ref 18–38)

## 2023-02-15 LAB — SEDIMENTATION RATE: Sed Rate: 99 mm/hr — ABNORMAL HIGH (ref 0–16)

## 2023-02-15 LAB — C-REACTIVE PROTEIN: CRP: 10.5 mg/dL — ABNORMAL HIGH (ref <1.0)

## 2023-02-15 LAB — LACTIC ACID, PLASMA: Lactic Acid, Venous: 1.8 mmol/L (ref 0.5–1.9)

## 2023-02-15 MED ORDER — INSULIN ASPART 100 UNIT/ML IJ SOLN
0.0000 [IU] | Freq: Every day | INTRAMUSCULAR | Status: DC
  Administered 2023-02-15 – 2023-02-17 (×3): 2 [IU] via SUBCUTANEOUS
  Administered 2023-02-19: 3 [IU] via SUBCUTANEOUS

## 2023-02-15 MED ORDER — PROSOURCE PLUS PO LIQD
30.0000 mL | Freq: Three times a day (TID) | ORAL | Status: DC
  Administered 2023-02-15 – 2023-02-20 (×14): 30 mL via ORAL
  Filled 2023-02-15 (×14): qty 30

## 2023-02-15 MED ORDER — SODIUM CHLORIDE 0.45 % IV SOLN: INTRAVENOUS | Status: AC

## 2023-02-15 MED ORDER — ADULT MULTIVITAMIN W/MINERALS CH
1.0000 | ORAL_TABLET | Freq: Every day | ORAL | Status: DC
  Administered 2023-02-15 – 2023-02-20 (×6): 1 via ORAL
  Filled 2023-02-15 (×6): qty 1

## 2023-02-15 MED ORDER — INSULIN ASPART 100 UNIT/ML IJ SOLN
0.0000 [IU] | Freq: Three times a day (TID) | INTRAMUSCULAR | Status: DC
  Administered 2023-02-15: 3 [IU] via SUBCUTANEOUS
  Administered 2023-02-16 – 2023-02-17 (×3): 5 [IU] via SUBCUTANEOUS
  Administered 2023-02-17: 3 [IU] via SUBCUTANEOUS
  Administered 2023-02-17: 5 [IU] via SUBCUTANEOUS
  Administered 2023-02-18: 2 [IU] via SUBCUTANEOUS
  Administered 2023-02-18: 5 [IU] via SUBCUTANEOUS
  Administered 2023-02-18: 8 [IU] via SUBCUTANEOUS
  Administered 2023-02-19 (×2): 3 [IU] via SUBCUTANEOUS
  Administered 2023-02-20 (×2): 2 [IU] via SUBCUTANEOUS

## 2023-02-15 MED ORDER — GLUCERNA SHAKE PO LIQD
237.0000 mL | ORAL | Status: DC
  Administered 2023-02-15 – 2023-02-19 (×4): 237 mL via ORAL

## 2023-02-15 MED ORDER — JUVEN PO PACK
1.0000 | PACK | Freq: Two times a day (BID) | ORAL | Status: DC
  Administered 2023-02-15 – 2023-02-20 (×8): 1 via ORAL
  Filled 2023-02-15 (×7): qty 1

## 2023-02-15 NOTE — Inpatient Diabetes Management (Signed)
Inpatient Diabetes Program Recommendations  AACE/ADA: New Consensus Statement on Inpatient Glycemic Control (2015)  Target Ranges:  Prepandial:   less than 140 mg/dL      Peak postprandial:   less than 180 mg/dL (1-2 hours)      Critically ill patients:  140 - 180 mg/dL   Lab Results  Component Value Date   GLUCAP 103 (H) 02/15/2023   HGBA1C 7.8 (H) 11/10/2022    Review of Glycemic Control  Diabetes history: DM2 Outpatient Diabetes medications: Lantus 90 units every day, Novolog 15-30 units at breakfast, 20-30 units at lunch, 20-35 units at supper Current orders for Inpatient glycemic control: Semglee 45 at bedtime, Novolog 0-15 TID with meals and 0-5 HS  HgbA1C - 7.8% Endo - Kerr CBGs today: 209, 173, 103  Blood sugars fairly good today. Meds verified with pt.   Inpatient Diabetes Program Recommendations:    Agree with orders.  Will likely need meal coverage insulin when taking po's. Consider adding Novolog 6 units TID with meals if eating > 50%  For surgery on 7/19. When NPO, consider changing Novolog to 0-15 units Q4H.  Will continue to follow.  Thank you. Ailene Ards, RD, LDN, CDCES Inpatient Diabetes Coordinator [phone]

## 2023-02-15 NOTE — Plan of Care (Signed)
  Problem: Health Behavior/Discharge Planning: Goal: Ability to manage health-related needs will improve Outcome: Progressing   Problem: Metabolic: Goal: Ability to maintain appropriate glucose levels will improve Outcome: Progressing   Problem: Nutritional: Goal: Maintenance of adequate nutrition will improve Outcome: Progressing   

## 2023-02-15 NOTE — Progress Notes (Signed)
Initial Nutrition Assessment  DOCUMENTATION CODES:   Obesity unspecified  INTERVENTION:  Continue Carb modified diet Glucerna Shake po once daily, each supplement provides 220 kcal and 10 grams of protein Juven BID to support wound healing 30 ml ProSource Plus TID, each supplement provides 100 kcals and 15 grams protein.  MVI with minerals daily  NUTRITION DIAGNOSIS:   Increased nutrient needs related to wound healing as evidenced by estimated needs  GOAL:   Patient will meet greater than or equal to 90% of their needs  MONITOR:   PO intake, Supplement acceptance, Labs, Weight trends, Skin  REASON FOR ASSESSMENT:   Consult Wound healing  ASSESSMENT:   Pt admitted with R second toe infection. PMH significant for insulin-dependent DM, chronic diastolic CHF, HTN, OSA on CPAP.  Podiatry following for R second digit osteomyelitis. Plans for amputation on Friday.   Spoke with pt at bedside. His wife present, assisted to provide nutrition related history.  Pt's appetite was doing well until about 1 month ago with new toe infection and addition of abx. He has been eating 1 small meal daily including 1 egg, a piece of toast and a piece of sausage for breakfast. Dinner may include 2-4oz pot roast or salmon coquette or salad with chicken. He has gotten to the point of eating about 75% of those meals though.   His wife reports that he has continued with Juven BID and ProSource BID at home to help continue with wound healing and increased protein intake. They also purchased Glucerna but he had not been consuming those yet.   Unfortunately, there is limited weight history on file to review. ED weight on 5/4 was 137 kg. Question whether this weight is accurate. Weight on 4/13 was 127.4 kg and current weight is 126 kg. Will continue to monitor trends throughout admission.   Medications: SSI 0-15 units q4h, semglee 45 units at bedtime, linzess, flagyl, protonix Drips: NaCl @  57ml/hr Abx  Labs: BUN 65, Cr 2.02, GFR 34, CBG's 173-208 x12 hours, HgbA1c 7.8% (10/2022)  NUTRITION - FOCUSED PHYSICAL EXAM:  Flowsheet Row Most Recent Value  Orbital Region No depletion  Upper Arm Region No depletion  Thoracic and Lumbar Region No depletion  Buccal Region No depletion  Temple Region No depletion  Clavicle Bone Region No depletion  Clavicle and Acromion Bone Region No depletion  Scapular Bone Region No depletion  Dorsal Hand No depletion  Patellar Region No depletion  Anterior Thigh Region No depletion  Posterior Calf Region No depletion  Edema (RD Assessment) Mild  [RLE]  Hair Reviewed  Eyes Reviewed  Mouth Reviewed  Skin Reviewed  Nails Reviewed       Diet Order:   Diet Order             Diet Carb Modified Fluid consistency: Thin; Room service appropriate? Yes  Diet effective now                   EDUCATION NEEDS:   Education needs have been addressed  Skin:  Skin Assessment: Skin Integrity Issues: Skin Integrity Issues:: Diabetic Ulcer Diabetic Ulcer: R second toe  Last BM:  7/16  Height:   Ht Readings from Last 1 Encounters:  02/14/23 5\' 9"  (1.753 m)    Weight:   Wt Readings from Last 1 Encounters:  02/15/23 126 kg    Ideal Body Weight:  72.7 kg  BMI:  Body mass index is 41.02 kg/m.  Estimated Nutritional Needs:   Kcal:  1900-2100  Protein:  105-120g  Fluid:  >/=1.9L  Drusilla Kanner, RDN, LDN Clinical Nutrition

## 2023-02-15 NOTE — Progress Notes (Signed)
[facility] PROGRESS NOTE   Micheal Morris BJY:[id number] DOB: 12/04/1945 DOA: 02/14/2023  PCP: Micheal Inch, MD  Brief History/Interval Summary: 77 y.o. male with medical history significant for insulin-dependent diabetes mellitus, chronic diastolic CHF, hypertension, OSA on CPAP, and right toe ulcer followed by podiatry who was sent to the emergency department by his podiatrist due to worsening right second toe infection.  This is despite being on antibiotics in the outpatient setting.  Patient was hospitalized for further management.    Consultants: Podiatry to follow patient in the hospital.  Procedures: None yet    Subjective/Interval History: Complains of neuropathic pain in his lower extremities but otherwise he feels well.  No nausea vomiting.  No chest pain or shortness of breath.    Assessment/Plan:  Gangrene involving the right second toe Patient sent to the hospital by podiatry.  Blood cultures have been sent and are pending. Pseudomonas grew from his wound in April 2024. Patient currently on cefepime.  He is afebrile.  WBC is elevated at 14.7. Defer management to podiatry. Based on podiatry note from 7/16 there does not appear to be any immediate plans for surgical intervention.  They do recommend MRI of the foot which will be ordered.  ID consultation is recommended which can be pursued in the next 24 to 48 hours.  Acute kidney injury on chronic kidney disease stage IIIa Baseline creatinine is around 1.4.  He was given IV fluids in the emergency department.  Diuretics and ACE inhibitor currently on hold.  Avoid nephrotoxic agents.  Monitor urine output.  Continue IV fluids for now.  Recheck labs tomorrow.  Chronic systolic CHF Seems to be reasonably well compensated.  Diuretics and ACE inhibitor placed on hold due to worsening renal function.  Insulin-dependent diabetes mellitus HbA1c was 7.8 in April. Continue insulin.  Monitor CBGs.   SSI.  Normocytic anemia Mild drop in hemoglobin is dilutional.  No evidence of overt bleeding.  Essential hypertension Monitor blood pressures closely.  ACE inhibitor placed on hold.  History of glaucoma Continue with eyedrops.  Obstructive sleep apnea Continue with CPAP.  Morbid obesity Estimated body mass index is 41.02 kg/m as calculated from the following:   Height as of this encounter: 5\' 9"  (1.753 m).   Weight as of this encounter: 126 kg.  DVT Prophylaxis: None for now due to possible surgery Code Status: Full code Family Communication: Discussed with patient and his wife Disposition Plan: To be determined  Status is: Inpatient Remains inpatient appropriate because: Need for IV antibiotics.  Possible need for surgery    Medications: Scheduled:  brimonidine  1 drop Both Eyes BID   And   timolol  1 drop Both Eyes BID   insulin aspart  0-15 Units Subcutaneous Q4H   insulin glargine-yfgn  45 Units Subcutaneous QHS   latanoprost  1 drop Both Eyes QHS   linaclotide  290 mcg Oral QAC breakfast   metroNIDAZOLE  500 mg Oral Q12H   pantoprazole  40 mg Oral Daily   simvastatin  20 mg Oral Daily   sodium chloride flush  3 mL Intravenous Q12H   Continuous:  sodium chloride 75 mL/hr at 02/15/23 0701   ceFEPime (MAXIPIME) IV 2 g (02/15/23 0943)   YQM:VHQIONGEXBMWU **OR** acetaminophen, fentaNYL (SUBLIMAZE) injection, oxyCODONE, senna-docusate  Antibiotics: Anti-infectives (From admission, onward)    Start     Dose/Rate Route Frequency Ordered Stop   02/15/23 1000  metroNIDAZOLE (FLAGYL) tablet 500 mg  500 mg Oral Every 12 hours 02/14/23 2313 02/22/23 0959   02/15/23 0100  ceFEPIme (MAXIPIME) 2 g in sodium chloride 0.9 % 100 mL IVPB        2 g 200 mL/hr over 30 Minutes Intravenous 2 times daily 02/14/23 2329     02/14/23 2200  cefTRIAXone (ROCEPHIN) 2 g in sodium chloride 0.9 % 100 mL IVPB  Status:  Discontinued       Placed in "And" Linked Group   2 g 200  mL/hr over 30 Minutes Intravenous Every 24 hours 02/14/23 2146 02/14/23 2313   02/14/23 2200  metroNIDAZOLE (FLAGYL) IVPB 500 mg  Status:  Discontinued       Placed in "And" Linked Group   500 mg 100 mL/hr over 60 Minutes Intravenous Every 12 hours 02/14/23 2146 02/14/23 2313       Objective:  Vital Signs  Vitals:   02/14/23 2338 02/15/23 0411 02/15/23 0500 02/15/23 0716  BP: 129/62 (!) 117/54  120/62  Pulse: 75 67  69  Resp: 17 17    Temp: 97.7 F (36.5 C) 98 F (36.7 C)  98.2 F (36.8 C)  TempSrc:    Oral  SpO2: 100% 97%  97%  Weight:   126 kg   Height:        Intake/Output Summary (Last 24 hours) at 02/15/2023 1034 Last data filed at 02/15/2023 2956 Gross per 24 hour  Intake 340 ml  Output 725 ml  Net -385 ml   Filed Weights   02/14/23 2156 02/14/23 2157 02/15/23 0500  Weight: 135 kg 123.4 kg 126 kg    General appearance: Awake alert.  In no distress Resp: Clear to auscultation bilaterally.  Normal effort Cardio: S1-S2 is normal regular.  No S3-S4.  No rubs murmurs or bruit GI: Abdomen is soft.  Nontender nondistended.  Bowel sounds are present normal.  No masses organomegaly Extremities: Right foot is covered in dressing Neurologic: Alert and oriented x3.  No focal neurological deficits.    Lab Results:  Data Reviewed: I have personally reviewed following labs and reports of the imaging studies  CBC: Recent Labs  Lab 02/14/23 1904 02/15/23 0037  WBC 13.8* 14.7*  NEUTROABS 11.3*  --   HGB 13.2 11.8*  HCT 41.3 37.8*  MCV 79.7* 80.4  PLT 385 382    Basic Metabolic Panel: Recent Labs  Lab 02/14/23 1904 02/15/23 0037  NA 130* 135  K 4.3 4.2  CL 95* 99  CO2 21* 24  GLUCOSE 306* 241*  BUN 70* 65*  CREATININE 2.11* 2.02*  CALCIUM 8.7* 8.6*    GFR: Estimated Creatinine Clearance: 41.5 mL/min (A) (by C-G formula based on SCr of 2.02 mg/dL (H)).  Liver Function Tests: Recent Labs  Lab 02/14/23 1904  AST 21  ALT 18  ALKPHOS 80  BILITOT  0.6  PROT 7.5  ALBUMIN 3.0*     CBG: Recent Labs  Lab 02/15/23 0013 02/15/23 0410 02/15/23 0717  GLUCAP 197* 209* 173*     Radiology Studies: DG Foot Complete Right  Result Date: 02/15/2023 Please see detailed radiograph report in office note.      LOS: 1 day   [name] [employer] on [email or web address]  02/15/2023, 10:34 AM

## 2023-02-15 NOTE — Consult Note (Addendum)
  Subjective:  Patient ID: Micheal Morris, male    DOB: 06-05-1946,  MRN: [id number]  A 77 y.o. male e with medical history significant for insulin-dependent diabetes mellitus, chronic diastolic CHF, hypertension, OSA on CPAP, and right second toe osteomyelitis.  He says it and it is getting worse.  He has was seen in our clinic was sent to be admitted for amputation.  He is found to be high white blood cell count of 14.7 denies any other acute complaints Objective:   Vitals:   02/15/23 0411 02/15/23 0716  BP: (!) 117/54 120/62  Pulse: 67 69  Resp: 17   Temp: 98 F (36.7 C) 98.2 F (36.8 C)  SpO2: 97% 97%   General AA&O x3. Normal mood and affect.  Vascular Dorsalis pedis and posterior tibial pulses 2/4 bilat. Brisk capillary refill to all digits. Pedal hair present.  Neurologic Epicritic sensation grossly intact.  Dermatologic Right second digit ulceration with fat layer exposed probing down to bone with superficial purulent drainage noted.  Mild erythema noted.  Orthopedic: MMT 5/5 in dorsiflexion, plantarflexion, inversion, and eversion. Normal joint ROM without pain or crepitus.    Assessment & Plan:  Patient was evaluated and treated and all questions answered.  Right second digit osteomyelitis -All questions or concerns were discussed with the patient in extensive detail given the amount ofBony destruction the present at the second distal tip I believe patient will benefit from second digit amputation. -MRI was ordered to confirm the extent of the bone infection -Weightbearing as tolerated in surgical shoe -Continue IV antibiotics -Betadine wet-to-dry dressing -Patient is scheduled for surgery on Friday.  N.p.o. after midnight on Thursday.  Candelaria Stagers, DPM  Accessible via secure chat for questions or concerns.

## 2023-02-16 DIAGNOSIS — N179 Acute kidney failure, unspecified: Secondary | ICD-10-CM | POA: Diagnosis not present

## 2023-02-16 DIAGNOSIS — E1165 Type 2 diabetes mellitus with hyperglycemia: Secondary | ICD-10-CM | POA: Diagnosis not present

## 2023-02-16 DIAGNOSIS — E11628 Type 2 diabetes mellitus with other skin complications: Secondary | ICD-10-CM | POA: Diagnosis not present

## 2023-02-16 DIAGNOSIS — L089 Local infection of the skin and subcutaneous tissue, unspecified: Secondary | ICD-10-CM | POA: Diagnosis not present

## 2023-02-16 LAB — CBC
HCT: 37.7 % — ABNORMAL LOW (ref 39.0–52.0)
Hemoglobin: 11.5 g/dL — ABNORMAL LOW (ref 13.0–17.0)
MCH: 24.6 pg — ABNORMAL LOW (ref 26.0–34.0)
MCHC: 30.5 g/dL (ref 30.0–36.0)
MCV: 80.6 fL (ref 80.0–100.0)
Platelets: 386 10*3/uL (ref 150–400)
RBC: 4.68 MIL/uL (ref 4.22–5.81)
RDW: 15.4 % (ref 11.5–15.5)
WBC: 13.6 10*3/uL — ABNORMAL HIGH (ref 4.0–10.5)
nRBC: 0 % (ref 0.0–0.2)

## 2023-02-16 LAB — BASIC METABOLIC PANEL
Anion gap: 6 (ref 5–15)
BUN: 46 mg/dL — ABNORMAL HIGH (ref 8–23)
CO2: 24 mmol/L (ref 22–32)
Calcium: 8.5 mg/dL — ABNORMAL LOW (ref 8.9–10.3)
Chloride: 105 mmol/L (ref 98–111)
Creatinine, Ser: 1.81 mg/dL — ABNORMAL HIGH (ref 0.61–1.24)
GFR, Estimated: 39 mL/min — ABNORMAL LOW (ref >60)
Glucose, Bld: 196 mg/dL — ABNORMAL HIGH (ref 70–99)
Potassium: 4.3 mmol/L (ref 3.5–5.1)
Sodium: 135 mmol/L (ref 135–145)

## 2023-02-16 LAB — GLUCOSE, CAPILLARY
Glucose-Capillary: 113 mg/dL — ABNORMAL HIGH (ref 70–99)
Glucose-Capillary: 219 mg/dL — ABNORMAL HIGH (ref 70–99)
Glucose-Capillary: 235 mg/dL — ABNORMAL HIGH (ref 70–99)

## 2023-02-16 MED ORDER — SODIUM CHLORIDE 0.45 % IV SOLN: INTRAVENOUS | Status: DC

## 2023-02-16 NOTE — Plan of Care (Signed)
  Problem: Metabolic: Goal: Ability to maintain appropriate glucose levels will improve Outcome: Progressing   Problem: Nutritional: Goal: Maintenance of adequate nutrition will improve Outcome: Progressing   Problem: Skin Integrity: Goal: Risk for impaired skin integrity will decrease Outcome: Progressing   

## 2023-02-16 NOTE — Progress Notes (Signed)
[facility] PROGRESS NOTE   Micheal Morris KGM:[id number] DOB: 1946-06-26 DOA: 02/14/2023  PCP: Eartha Inch, MD  Brief History/Interval Summary: 77 y.o. male with medical history significant for insulin-dependent diabetes mellitus, chronic diastolic CHF, hypertension, OSA on CPAP, and right toe ulcer followed by podiatry who was sent to the emergency department by his podiatrist due to worsening right second toe infection.  This is despite being on antibiotics in the outpatient setting.  Patient was hospitalized for further management.    Consultants: Podiatry   Procedures: None yet    Subjective/Interval History: Denies any new complaints this morning.  Slept well.  Wife is at the bedside.    Assessment/Plan:  Osteomyelitis involving right second toe/abscess Patient sent to the hospital by podiatry.  Blood cultures have been sent and are pending. Pseudomonas grew from his wound in April 2024. Patient was placed on cefepime.  His WBC was noted to be elevated.  13.6 today. MRI of the right foot was done which raised concern for osteomyelitis in the second digit.  They also saw a fluid collection along the medial aspect of the first metatarsal raising concern for an abscess. Podiatry plans to take to the OR tomorrow. Pain is reasonably well-controlled.  Acute kidney injury on chronic kidney disease stage IIIa Baseline creatinine is around 1.4.  He was given IV fluids in the emergency department.  Diuretics and ACE inhibitor currently on hold.  Avoid nephrotoxic agents.  Monitor urine output.   Renal function slightly improved to 1.8 today.  Continue IV fluids for now.  Avoid nephrotoxic agents.    Chronic systolic CHF Seems to be reasonably well compensated.  Diuretics and ACE inhibitor placed on hold due to worsening renal function. Last echocardiogram is from 2022 which showed that he had normal left ventricular systolic function at that time.  Insulin-dependent  diabetes mellitus HbA1c was 7.8 in April. Continue insulin.  Monitor CBGs.  SSI.  Normocytic anemia Mild drop in hemoglobin is dilutional.  No evidence of overt bleeding.  Essential hypertension Monitor blood pressures closely.  ACE inhibitor placed on hold.  History of glaucoma Continue with eyedrops.  Obstructive sleep apnea Continue with CPAP.  Morbid obesity Estimated body mass index is 41.02 kg/m as calculated from the following:   Height as of this encounter: 5\' 9"  (1.753 m).   Weight as of this encounter: 126 kg.  DVT Prophylaxis: None for now due to possible surgery Code Status: Full code Family Communication: Discussed with patient and his wife Disposition Plan: To be determined  Status is: Inpatient Remains inpatient appropriate because: Need for IV antibiotics and surgery    Medications: Scheduled:  (feeding supplement) PROSource Plus  30 mL Oral TID BM   brimonidine  1 drop Both Eyes BID   And   timolol  1 drop Both Eyes BID   feeding supplement (GLUCERNA SHAKE)  237 mL Oral Q24H   insulin aspart  0-15 Units Subcutaneous TID WC   insulin aspart  0-5 Units Subcutaneous QHS   insulin glargine-yfgn  45 Units Subcutaneous QHS   latanoprost  1 drop Both Eyes QHS   linaclotide  290 mcg Oral QAC breakfast   metroNIDAZOLE  500 mg Oral Q12H   multivitamin with minerals  1 tablet Oral Daily   nutrition supplement (JUVEN)  1 packet Oral BID BM   pantoprazole  40 mg Oral Daily   simvastatin  20 mg Oral Daily   sodium chloride flush  3 mL Intravenous Q12H  Continuous:  ceFEPime (MAXIPIME) IV 2 g (02/16/23 8295)   AOZ:HYQMVHQIONGEX **OR** acetaminophen, fentaNYL (SUBLIMAZE) injection, oxyCODONE, senna-docusate  Antibiotics: Anti-infectives (From admission, onward)    Start     Dose/Rate Route Frequency Ordered Stop   02/15/23 1000  metroNIDAZOLE (FLAGYL) tablet 500 mg        500 mg Oral Every 12 hours 02/14/23 2313 02/22/23 0959   02/15/23 0100  ceFEPIme  (MAXIPIME) 2 g in sodium chloride 0.9 % 100 mL IVPB        2 g 200 mL/hr over 30 Minutes Intravenous 2 times daily 02/14/23 2329     02/14/23 2200  cefTRIAXone (ROCEPHIN) 2 g in sodium chloride 0.9 % 100 mL IVPB  Status:  Discontinued       Placed in "And" Linked Group   2 g 200 mL/hr over 30 Minutes Intravenous Every 24 hours 02/14/23 2146 02/14/23 2313   02/14/23 2200  metroNIDAZOLE (FLAGYL) IVPB 500 mg  Status:  Discontinued       Placed in "And" Linked Group   500 mg 100 mL/hr over 60 Minutes Intravenous Every 12 hours 02/14/23 2146 02/14/23 2313       Objective:  Vital Signs  Vitals:   02/15/23 1617 02/15/23 1946 02/16/23 0450 02/16/23 0737  BP: 116/68 116/64 122/65 121/63  Pulse: 72 71 69 65  Resp:  18 17 18   Temp: 98.1 F (36.7 C) 98.8 F (37.1 C)  98.8 F (37.1 C)  TempSrc: Oral Oral    SpO2: 99% 100% 98%   Weight:      Height:        Intake/Output Summary (Last 24 hours) at 02/16/2023 1018 Last data filed at 02/16/2023 0239 Gross per 24 hour  Intake --  Output 550 ml  Net -550 ml   Filed Weights   02/14/23 2156 02/14/23 2157 02/15/23 0500  Weight: 135 kg 123.4 kg 126 kg    General appearance: Awake alert.  In no distress Resp: Clear to auscultation bilaterally.  Normal effort Cardio: S1-S2 is normal regular.  No S3-S4.  No rubs murmurs or bruit GI: Abdomen is soft.  Nontender nondistended.  Bowel sounds are present normal.  No masses organomegaly Extremities: Right foot covered in dressing   Lab Results:  Data Reviewed: I have personally reviewed following labs and reports of the imaging studies  CBC: Recent Labs  Lab 02/14/23 1904 02/15/23 0037 02/16/23 0116  WBC 13.8* 14.7* 13.6*  NEUTROABS 11.3*  --   --   HGB 13.2 11.8* 11.5*  HCT 41.3 37.8* 37.7*  MCV 79.7* 80.4 80.6  PLT 385 382 386    Basic Metabolic Panel: Recent Labs  Lab 02/14/23 1904 02/15/23 0037 02/16/23 0116  NA 130* 135 135  K 4.3 4.2 4.3  CL 95* 99 105  CO2 21* 24  24  GLUCOSE 306* 241* 196*  BUN 70* 65* 46*  CREATININE 2.11* 2.02* 1.81*  CALCIUM 8.7* 8.6* 8.5*    GFR: Estimated Creatinine Clearance: 46.3 mL/min (A) (by C-G formula based on SCr of 1.81 mg/dL (H)).  Liver Function Tests: Recent Labs  Lab 02/14/23 1904  AST 21  ALT 18  ALKPHOS 80  BILITOT 0.6  PROT 7.5  ALBUMIN 3.0*     CBG: Recent Labs  Lab 02/15/23 0717 02/15/23 1245 02/15/23 1619 02/15/23 2125 02/16/23 0740  GLUCAP 173* 103* 172* 219* 113*     Radiology Studies: MR FOOT RIGHT WO CONTRAST  Result Date: 02/15/2023 CLINICAL DATA:  Soft tissue infection  suspected. History of insulin-dependent diabetes. EXAM: MRI OF THE RIGHT FOREFOOT WITHOUT CONTRAST TECHNIQUE: Multiplanar, multisequence MR imaging of the right was performed. No intravenous contrast was administered. COMPARISON:  Radiographs dated February 14, 2019 FINDINGS: Bones/Joint/Cartilage Marrow edema of the proximal middle and distal phalanges of the second digit with deep skin wound about the dorsal aspect of the second digit concerning for osteomyelitis. Postsurgical changes for prior amputation through the first metatarsophalangeal joint. Marrow signal within remaining osseous structures is within normal limits. Ligaments Lisfranc ligament is intact.  Collateral ligaments are maintained. Muscles and Tendons Increased intrasubstance signal of the plantar muscles suggesting diabetic myopathy/myositis. Soft tissues Soft tissue edema about the dorsum of the foot with a collection about medial aspect of the first metatarsal measuring a proximally 1.7 x 3.3 cm concerning for an abscess. IMPRESSION: 1. Marrow edema of the proximal, middle and distal phalanges of the second digit with deep skin wound about the dorsal aspect of the second digit concerning for osteomyelitis. 2. Fluid collection about the medial aspect of the first metatarsal measuring 1.7 x 3.3 cm concerning for an abscess. 3. Postsurgical changes for prior  amputation through the first metatarsophalangeal joint. 4. Increased intrasubstance signal of the plantar muscles suggesting diabetic myopathy/myositis. Electronically Signed   By: Larose Hires D.O.   On: 02/15/2023 22:14   DG Foot Complete Right  Result Date: 02/15/2023 Please see detailed radiograph report in office note.      LOS: 2 days   Haeden Hudock Rito Ehrlich  [facility] Pager on [email or web address]  02/16/2023, 10:18 AM

## 2023-02-16 NOTE — Progress Notes (Signed)
Here's his preliminary culture report from when I saw him at the office before sending him to the ED.  Thank you.

## 2023-02-16 NOTE — Plan of Care (Signed)
  Problem: Education: Goal: Ability to describe self-care measures that may prevent or decrease complications (Diabetes Survival Skills Education) will improve 02/16/2023 1904 by Irish Lack, RN Outcome: Progressing 02/16/2023 1903 by Irish Lack, RN Outcome: Progressing Goal: Individualized Educational Video(s) Outcome: Progressing   Problem: Fluid Volume: Goal: Ability to maintain a balanced intake and output will improve Outcome: Progressing

## 2023-02-16 NOTE — Inpatient Diabetes Management (Signed)
Inpatient Diabetes Program Recommendations  AACE/ADA: New Consensus Statement on Inpatient Glycemic Control (2015)  Target Ranges:  Prepandial:   less than 140 mg/dL      Peak postprandial:   less than 180 mg/dL (1-2 hours)      Critically ill patients:  140 - 180 mg/dL   Lab Results  Component Value Date   GLUCAP 219 (H) 02/16/2023   HGBA1C 7.8 (H) 11/10/2022    Review of Glycemic Control  Latest Reference Range & Units 02/15/23 07:17 02/15/23 12:45 02/15/23 16:19 02/15/23 21:25 02/16/23 07:40 02/16/23 11:18  Glucose-Capillary 70 - 99 mg/dL 161 (H) 096 (H) 045 (H) 219 (H) 113 (H) 219 (H)   Diabetes history: DM2 Outpatient Diabetes medications: Lantus 90 units every day, Novolog 15-30 units at breakfast, 20-30 units at lunch, 20-35 units at supper Current orders for Inpatient glycemic control:  Semglee 45 units qhs Novolog 0-15 TID + HS  Glucerna Q24 hours HgbA1C - 7.8% Endo - [facility] CBGs today: 209, 173, 103  Inpatient Diabetes Program Recommendations:    -   Consider adding Novolog 4 units TID with meals if eating > 50%  Will continue to follow.  Thanks,  Christena Deem RN, MSN, BC-ADM Inpatient Diabetes Coordinator Team Pager [phone] (8a-5p)

## 2023-02-16 NOTE — Plan of Care (Signed)
  Problem: Education: Goal: Ability to describe self-care measures that may prevent or decrease complications (Diabetes Survival Skills Education) will improve Outcome: Progressing   Problem: Fluid Volume: Goal: Ability to maintain a balanced intake and output will improve Outcome: Progressing

## 2023-02-17 DIAGNOSIS — E11628 Type 2 diabetes mellitus with other skin complications: Secondary | ICD-10-CM | POA: Diagnosis not present

## 2023-02-17 DIAGNOSIS — E1165 Type 2 diabetes mellitus with hyperglycemia: Secondary | ICD-10-CM | POA: Diagnosis not present

## 2023-02-17 DIAGNOSIS — L089 Local infection of the skin and subcutaneous tissue, unspecified: Secondary | ICD-10-CM | POA: Diagnosis not present

## 2023-02-17 DIAGNOSIS — L03119 Cellulitis of unspecified part of limb: Secondary | ICD-10-CM | POA: Diagnosis not present

## 2023-02-17 DIAGNOSIS — N179 Acute kidney failure, unspecified: Secondary | ICD-10-CM | POA: Diagnosis not present

## 2023-02-17 LAB — GLUCOSE, CAPILLARY
Glucose-Capillary: 177 mg/dL — ABNORMAL HIGH (ref 70–99)
Glucose-Capillary: 221 mg/dL — ABNORMAL HIGH (ref 70–99)
Glucose-Capillary: 205 mg/dL — ABNORMAL HIGH (ref 70–99)
Glucose-Capillary: 217 mg/dL — ABNORMAL HIGH (ref 70–99)
Glucose-Capillary: 244 mg/dL — ABNORMAL HIGH (ref 70–99)

## 2023-02-17 LAB — CBC
HCT: 39.9 % (ref 39.0–52.0)
Hemoglobin: 12.5 g/dL — ABNORMAL LOW (ref 13.0–17.0)
MCH: 25.5 pg — ABNORMAL LOW (ref 26.0–34.0)
MCHC: 31.3 g/dL (ref 30.0–36.0)
MCV: 81.4 fL (ref 80.0–100.0)
Platelets: 390 10*3/uL (ref 150–400)
RBC: 4.9 MIL/uL (ref 4.22–5.81)
RDW: 15.5 % (ref 11.5–15.5)
WBC: 13.8 10*3/uL — ABNORMAL HIGH (ref 4.0–10.5)
nRBC: 0 % (ref 0.0–0.2)

## 2023-02-17 LAB — WOUND CULTURE
MICRO NUMBER:: 15206414
SPECIMEN QUALITY:: ADEQUATE

## 2023-02-17 LAB — BASIC METABOLIC PANEL
Anion gap: 9 (ref 5–15)
BUN: 41 mg/dL — ABNORMAL HIGH (ref 8–23)
CO2: 21 mmol/L — ABNORMAL LOW (ref 22–32)
Calcium: 8.7 mg/dL — ABNORMAL LOW (ref 8.9–10.3)
Chloride: 107 mmol/L (ref 98–111)
Creatinine, Ser: 1.51 mg/dL — ABNORMAL HIGH (ref 0.61–1.24)
GFR, Estimated: 48 mL/min — ABNORMAL LOW (ref >60)
Glucose, Bld: 250 mg/dL — ABNORMAL HIGH (ref 70–99)
Potassium: 4.6 mmol/L (ref 3.5–5.1)
Sodium: 137 mmol/L (ref 135–145)

## 2023-02-17 LAB — CULTURE, BLOOD (ROUTINE X 2)
Culture: NO GROWTH
Special Requests: ADEQUATE

## 2023-02-17 MED ORDER — SODIUM CHLORIDE 0.45 % IV SOLN: INTRAVENOUS | Status: AC

## 2023-02-17 MED ORDER — INSULIN GLARGINE-YFGN 100 UNIT/ML ~~LOC~~ SOLN
50.0000 [IU] | Freq: Every day | SUBCUTANEOUS | Status: DC
  Administered 2023-02-17 – 2023-02-19 (×3): 50 [IU] via SUBCUTANEOUS
  Filled 2023-02-17 (×4): qty 0.5

## 2023-02-17 MED ORDER — HEPARIN SODIUM (PORCINE) 5000 UNIT/ML IJ SOLN
5000.0000 [IU] | Freq: Three times a day (TID) | INTRAMUSCULAR | Status: AC
  Administered 2023-02-17: 5000 [IU] via SUBCUTANEOUS
  Filled 2023-02-17 (×2): qty 1

## 2023-02-17 NOTE — Plan of Care (Signed)
  Problem: Education: Goal: Ability to describe self-care measures that may prevent or decrease complications (Diabetes Survival Skills Education) will improve Outcome: Not Progressing Goal: Individualized Educational Video(s) Outcome: Not Progressing   Problem: Coping: Goal: Ability to adjust to condition or change in health will improve Outcome: Not Progressing   Problem: Fluid Volume: Goal: Ability to maintain a balanced intake and output will improve Outcome: Not Progressing   Problem: Health Behavior/Discharge Planning: Goal: Ability to identify and utilize available resources and services will improve Outcome: Not Progressing Goal: Ability to manage health-related needs will improve Outcome: Not Progressing   Problem: Metabolic: Goal: Ability to maintain appropriate glucose levels will improve Outcome: Not Progressing   Problem: Nutritional: Goal: Maintenance of adequate nutrition will improve Outcome: Not Progressing Goal: Progress toward achieving an optimal weight will improve Outcome: Not Progressing   Problem: Skin Integrity: Goal: Risk for impaired skin integrity will decrease Outcome: Not Progressing   Problem: Tissue Perfusion: Goal: Adequacy of tissue perfusion will improve Outcome: Not Progressing   Problem: Education: Goal: Knowledge of General Education information will improve Description: Including pain rating scale, medication(s)/side effects and non-pharmacologic comfort measures Outcome: Not Progressing   Problem: Health Behavior/Discharge Planning: Goal: Ability to manage health-related needs will improve Outcome: Not Progressing   Problem: Clinical Measurements: Goal: Ability to maintain clinical measurements within normal limits will improve Outcome: Not Progressing

## 2023-02-17 NOTE — Plan of Care (Signed)
  Problem: Education: Goal: Ability to describe self-care measures that may prevent or decrease complications (Diabetes Survival Skills Education) will improve Outcome: Progressing   Problem: Coping: Goal: Ability to adjust to condition or change in health will improve Outcome: Progressing   Problem: Health Behavior/Discharge Planning: Goal: Ability to manage health-related needs will improve Outcome: Progressing   Problem: Metabolic: Goal: Ability to maintain appropriate glucose levels will improve Outcome: Progressing

## 2023-02-17 NOTE — Inpatient Diabetes Management (Signed)
Inpatient Diabetes Program Recommendations  AACE/ADA: New Consensus Statement on Inpatient Glycemic Control (2015)  Target Ranges:  Prepandial:   less than 140 mg/dL      Peak postprandial:   less than 180 mg/dL (1-2 hours)      Critically ill patients:  140 - 180 mg/dL   Lab Results  Component Value Date   GLUCAP 177 (H) 02/17/2023   HGBA1C 7.8 (H) 11/10/2022    Review of Glycemic Control  Latest Reference Range & Units 02/16/23 07:40 02/16/23 11:18 02/16/23 19:17 02/17/23 07:28  Glucose-Capillary 70 - 99 mg/dL 409 (H) 811 (H) 914 (H) 177 (H)   Diabetes history: DM2 Outpatient Diabetes medications: Lantus 90 units every day, Novolog 15-30 units at breakfast, 20-30 units at lunch, 20-35 units at supper Current orders for Inpatient glycemic control:  Semglee 45 units qhs Novolog 0-15 TID + HS  Glucerna Q24 hours HgbA1C - 7.8% Endo - [facility] CBGs today: 209, 173, 103  Inpatient Diabetes Program Recommendations:    -   Consider adding Novolog 4 units TID with meals if eating > 50%  Will continue to follow.  Thanks,  Christena Deem RN, MSN, BC-ADM Inpatient Diabetes Coordinator Team Pager [phone] (8a-5p)

## 2023-02-17 NOTE — Plan of Care (Signed)

## 2023-02-17 NOTE — Progress Notes (Signed)
[facility] PROGRESS NOTE   Micheal Morris:[id number] DOB: 1946/02/04 DOA: 02/14/2023  PCP: Eartha Inch, MD  Brief History/Interval Summary: 77 y.o. male with medical history significant for insulin-dependent diabetes mellitus, chronic diastolic CHF, hypertension, OSA on CPAP, and right toe ulcer followed by podiatry who was sent to the emergency department by his podiatrist due to worsening right second toe infection.  This is despite being on antibiotics in the outpatient setting.  Patient was hospitalized for further management.    Consultants: Podiatry   Procedures: None yet    Subjective/Interval History: No complaints this AM.    Assessment/Plan:  Osteomyelitis involving right second toe/abscess Patient sent to the hospital by podiatry.  Blood cultures have been sent and negative so far.  Pseudomonas grew from his wound in April 2024. Patient was placed on cefepime.  His WBC was noted to be elevated.   MRI of the right foot was done which raised concern for osteomyelitis in the second digit.  They also saw a fluid collection along the medial aspect of the first metatarsal raising concern for an abscess. Podiatry plans to take to the OR on sunday. Pain is reasonably well-controlled.  Acute kidney injury on chronic kidney disease stage IIIa Baseline creatinine is around 1.4.  He was given IV fluids in the emergency department.  Diuretics and ACE inhibitor currently on hold.  Avoid nephrotoxic agents.  Monitor urine output.   Renal function slightly improved to 1.8.   Continue IV fluids for now.  Avoid nephrotoxic agents.   Recheck labs in AM. Monitor UO.  Chronic systolic CHF Seems to be reasonably well compensated.  Diuretics and ACE inhibitor placed on hold due to worsening renal function. Last echocardiogram is from 2022 which showed that he had normal left ventricular systolic function at that time.  Insulin-dependent diabetes mellitus HbA1c was  7.8 in April. Continue insulin.  Monitor CBGs.  SSI. CBG's are poorly controlled. Increase dose of glargine.  Normocytic anemia Mild drop in hemoglobin is dilutional.  No evidence of overt bleeding.  Essential hypertension Monitor blood pressures closely.  ACE inhibitor placed on hold.  History of glaucoma Continue with eyedrops.  Obstructive sleep apnea Continue with CPAP.  Morbid obesity Estimated body mass index is 41.02 kg/m as calculated from the following:   Height as of this encounter: 5\' 9"  (1.753 m).   Weight as of this encounter: 126 kg.  DVT Prophylaxis: SQ heparin for 2 days Code Status: Full code Family Communication: Discussed with patient and his wife Disposition Plan: To be determined  Status is: Inpatient Remains inpatient appropriate because: Need for IV antibiotics and surgery    Medications: Scheduled:  (feeding supplement) PROSource Plus  30 mL Oral TID BM   brimonidine  1 drop Both Eyes BID   And   timolol  1 drop Both Eyes BID   feeding supplement (GLUCERNA SHAKE)  237 mL Oral Q24H   insulin aspart  0-15 Units Subcutaneous TID WC   insulin aspart  0-5 Units Subcutaneous QHS   insulin glargine-yfgn  45 Units Subcutaneous QHS   latanoprost  1 drop Both Eyes QHS   linaclotide  290 mcg Oral QAC breakfast   metroNIDAZOLE  500 mg Oral Q12H   multivitamin with minerals  1 tablet Oral Daily   nutrition supplement (JUVEN)  1 packet Oral BID BM   pantoprazole  40 mg Oral Daily   simvastatin  20 mg Oral Daily   sodium chloride flush  3  mL Intravenous Q12H   Continuous:  sodium chloride 50 mL/hr at 02/16/23 1416   ceFEPime (MAXIPIME) IV 2 g (02/17/23 0834)   ZOX:[id number] **OR** acetaminophen, fentaNYL (SUBLIMAZE) injection, oxyCODONE, senna-docusate  Antibiotics: Anti-infectives (From admission, onward)    Start     Dose/Rate Route Frequency Ordered Stop   02/15/23 1000  metroNIDAZOLE (FLAGYL) tablet 500 mg        500 mg Oral Every 12 hours  02/14/23 2313 02/22/23 0959   02/15/23 0100  ceFEPIme (MAXIPIME) 2 g in sodium chloride 0.9 % 100 mL IVPB        2 g 200 mL/hr over 30 Minutes Intravenous 2 times daily 02/14/23 2329     02/14/23 2200  cefTRIAXone (ROCEPHIN) 2 g in sodium chloride 0.9 % 100 mL IVPB  Status:  Discontinued       Placed in "And" Linked Group   2 g 200 mL/hr over 30 Minutes Intravenous Every 24 hours 02/14/23 2146 02/14/23 2313   02/14/23 2200  metroNIDAZOLE (FLAGYL) IVPB 500 mg  Status:  Discontinued       Placed in "And" Linked Group   500 mg 100 mL/hr over 60 Minutes Intravenous Every 12 hours 02/14/23 2146 02/14/23 2313       Objective:  Vital Signs  Vitals:   02/16/23 0737 02/16/23 1356 02/16/23 1930 02/17/23 0744  BP: 121/63 136/67 (!) 137/58 124/62  Pulse: 65 75 80 73  Resp: 18 18 18 18   Temp: 98.8 F (37.1 C) 98 F (36.7 C) 98.7 F (37.1 C) 98.3 F (36.8 C)  TempSrc:    Oral  SpO2:  98% 98% 98%  Weight:      Height:        Intake/Output Summary (Last 24 hours) at 02/17/2023 1101 Last data filed at 02/17/2023 0400 Gross per 24 hour  Intake 760 ml  Output 600 ml  Net 160 ml   Filed Weights   02/14/23 2156 02/14/23 2157 02/15/23 0500  Weight: 135 kg 123.4 kg 126 kg   Awake alert, in no distress Lungs are CTA S1s2 normal reg Abdomen is soft Right foot is covered in dressing    Lab Results:  Data Reviewed: I have personally reviewed following labs and reports of the imaging studies  CBC: Recent Labs  Lab 02/14/23 1904 02/15/23 0037 02/16/23 0116  WBC 13.8* 14.7* 13.6*  NEUTROABS 11.3*  --   --   HGB 13.2 11.8* 11.5*  HCT 41.3 37.8* 37.7*  MCV 79.7* 80.4 80.6  PLT 385 382 386    Basic Metabolic Panel: Recent Labs  Lab 02/14/23 1904 02/15/23 0037 02/16/23 0116  NA 130* 135 135  K 4.3 4.2 4.3  CL 95* 99 105  CO2 21* 24 24  GLUCOSE 306* 241* 196*  BUN 70* 65* 46*  CREATININE 2.11* 2.02* 1.81*  CALCIUM 8.7* 8.6* 8.5*    GFR: Estimated Creatinine  Clearance: 46.3 mL/min (A) (by C-G formula based on SCr of 1.81 mg/dL (H)).  Liver Function Tests: Recent Labs  Lab 02/14/23 1904  AST 21  ALT 18  ALKPHOS 80  BILITOT 0.6  PROT 7.5  ALBUMIN 3.0*     CBG: Recent Labs  Lab 02/16/23 0740 02/16/23 1118 02/16/23 1917 02/17/23 0728 02/17/23 1057  GLUCAP 113* 219* 235* 177* 221*     Radiology Studies: MR FOOT RIGHT WO CONTRAST  Result Date: 02/15/2023 CLINICAL DATA:  Soft tissue infection suspected. History of insulin-dependent diabetes. EXAM: MRI OF THE RIGHT FOREFOOT WITHOUT CONTRAST TECHNIQUE:  Multiplanar, multisequence MR imaging of the right was performed. No intravenous contrast was administered. COMPARISON:  Radiographs dated February 14, 2019 FINDINGS: Bones/Joint/Cartilage Marrow edema of the proximal middle and distal phalanges of the second digit with deep skin wound about the dorsal aspect of the second digit concerning for osteomyelitis. Postsurgical changes for prior amputation through the first metatarsophalangeal joint. Marrow signal within remaining osseous structures is within normal limits. Ligaments Lisfranc ligament is intact.  Collateral ligaments are maintained. Muscles and Tendons Increased intrasubstance signal of the plantar muscles suggesting diabetic myopathy/myositis. Soft tissues Soft tissue edema about the dorsum of the foot with a collection about medial aspect of the first metatarsal measuring a proximally 1.7 x 3.3 cm concerning for an abscess. IMPRESSION: 1. Marrow edema of the proximal, middle and distal phalanges of the second digit with deep skin wound about the dorsal aspect of the second digit concerning for osteomyelitis. 2. Fluid collection about the medial aspect of the first metatarsal measuring 1.7 x 3.3 cm concerning for an abscess. 3. Postsurgical changes for prior amputation through the first metatarsophalangeal joint. 4. Increased intrasubstance signal of the plantar muscles suggesting diabetic  myopathy/myositis. Electronically Signed   By: Larose Hires D.O.   On: 02/15/2023 22:14       LOS: 3 days   Zarriah Starkel Rito Ehrlich  [facility] Pager on [email or web address]  02/17/2023, 11:01 AM

## 2023-02-17 NOTE — Progress Notes (Signed)
   02/17/23 1930  BiPAP/CPAP/SIPAP  BiPAP/CPAP/SIPAP Pt Type Adult (Home unit places self on/off)

## 2023-02-17 NOTE — Consult Note (Signed)
  Subjective:  Patient ID: Micheal Morris, male    DOB: March 03, 1946,  MRN: [id number]  A 77 y.o. male  was seen at bedside. Doing well. No acute complaints  Objective:   Vitals:   02/16/23 1930 02/17/23 0744  BP: (!) 137/58 124/62  Pulse: 80 73  Resp: 18 18  Temp: 98.7 F (37.1 C) 98.3 F (36.8 C)  SpO2: 98% 98%   General AA&O x3. Normal mood and affect.  Vascular Dorsalis pedis and posterior tibial pulses 2/4 bilat. Brisk capillary refill to all digits. Pedal hair present.  Neurologic Epicritic sensation grossly intact.  Dermatologic Right second digit ulceration with fat layer exposed probing down to bone with superficial purulent drainage noted.  Mild erythema noted.  Orthopedic: MMT 5/5 in dorsiflexion, plantarflexion, inversion, and eversion. Normal joint ROM without pain or crepitus.   IMPRESSION: 1. Marrow edema of the proximal, middle and distal phalanges of the second digit with deep skin wound about the dorsal aspect of the second digit concerning for osteomyelitis. 2. Fluid collection about the medial aspect of the first metatarsal measuring 1.7 x 3.3 cm concerning for an abscess. 3. Postsurgical changes for prior amputation through the first metatarsophalangeal joint. 4. Increased intrasubstance signal of the plantar muscles suggesting diabetic myopathy/myositis.  Assessment & Plan:  Patient was evaluated and treated and all questions answered.  Right second digit osteomyelitis -All questions or concerns were discussed with the patient in extensive detail given the amount ofBony destruction the present at the second distal tip I believe patient will benefit from second digit amputation. -MRI confirmed second digit osteomyelitis and will need surgical amputation -Weightbearing as tolerated in surgical shoe -Continue IV antibiotics -Betadine wet-to-dry dressing -Patient is scheduled for surgery on sunday  N.p.o. after midnight on Saturday   Candelaria Stagers,  DPM  Accessible via secure chat for questions or concerns.

## 2023-02-17 NOTE — Progress Notes (Signed)
Mobility Specialist Progress Note:   02/17/23 1537  Mobility  Activity Ambulated with assistance in hallway  Level of Assistance Standby assist, set-up cues, supervision of patient - no hands on  Assistive Device Front wheel walker  Distance Ambulated (ft) 550 ft  Activity Response Tolerated well  Mobility Referral Yes  $Mobility charge 1 Mobility  Mobility Specialist Start Time (ACUTE ONLY) 1437  Mobility Specialist Stop Time (ACUTE ONLY) 1449  Mobility Specialist Time Calculation (min) (ACUTE ONLY) 12 min   Received pt in chair having no complaints and agreeable to mobility. Pt was asymptomatic throughout ambulation and returned to room w/o fault. Left in chair w/ call bell in reach and all needs met.   Thompson Grayer Mobility Specialist  Please contact vis Secure Chat or  Rehab Office [phone]

## 2023-02-18 DIAGNOSIS — N179 Acute kidney failure, unspecified: Secondary | ICD-10-CM | POA: Diagnosis not present

## 2023-02-18 DIAGNOSIS — E1165 Type 2 diabetes mellitus with hyperglycemia: Secondary | ICD-10-CM | POA: Diagnosis not present

## 2023-02-18 DIAGNOSIS — L089 Local infection of the skin and subcutaneous tissue, unspecified: Secondary | ICD-10-CM | POA: Diagnosis not present

## 2023-02-18 DIAGNOSIS — E11628 Type 2 diabetes mellitus with other skin complications: Secondary | ICD-10-CM | POA: Diagnosis not present

## 2023-02-18 LAB — SURGICAL PCR SCREEN
MRSA, PCR: NEGATIVE
Staphylococcus aureus: NEGATIVE

## 2023-02-18 LAB — GLUCOSE, CAPILLARY
Glucose-Capillary: 141 mg/dL — ABNORMAL HIGH (ref 70–99)
Glucose-Capillary: 291 mg/dL — ABNORMAL HIGH (ref 70–99)
Glucose-Capillary: 213 mg/dL — ABNORMAL HIGH (ref 70–99)
Glucose-Capillary: 191 mg/dL — ABNORMAL HIGH (ref 70–99)

## 2023-02-18 MED ORDER — POLYETHYLENE GLYCOL 3350 17 G PO PACK
17.0000 g | PACK | Freq: Every day | ORAL | Status: DC
  Administered 2023-02-18 – 2023-02-19 (×2): 17 g via ORAL
  Filled 2023-02-18 (×2): qty 1

## 2023-02-18 NOTE — Progress Notes (Signed)
[facility] PROGRESS NOTE   [name]:[id number] DOB: 03-07-1946 DOA: 02/14/2023  PCP: Eartha Inch, MD  Brief History/Interval Summary: 77 y.o. male with medical history significant for insulin-dependent diabetes mellitus, chronic diastolic CHF, hypertension, OSA on CPAP, and right toe ulcer followed by podiatry who was sent to the emergency department by his podiatrist due to worsening right second toe infection.  This is despite being on antibiotics in the outpatient setting.  Patient was hospitalized for further management.    Consultants: Podiatry   Procedures: None yet    Subjective/Interval History: Denies any complaints.  Asking questions about the DVT prophylaxis which were answered.  Looking forward to surgery tomorrow.   Assessment/Plan:  Osteomyelitis involving right second toe/abscess Patient sent to the hospital by podiatry.  Blood cultures have been sent and negative so far.  Pseudomonas grew from his wound in April 2024. Patient was placed on cefepime.  His WBC was noted to be elevated.   MRI of the right foot was done which raised concern for osteomyelitis in the second digit.  They also saw a fluid collection along the medial aspect of the first metatarsal raising concern for an abscess. Podiatry plans to take to the OR on sunday. Pain is reasonably well-controlled.  Continue antibiotics.  WBC was noted to be elevated yesterday.  Will recheck labs tomorrow.  Acute kidney injury on chronic kidney disease stage IIIa Baseline creatinine is around 1.4.  He was given IV fluids in the emergency department.  Diuretics and ACE inhibitor currently on hold.  Avoid nephrotoxic agents.  Monitor urine output.   Renal function almost back to baseline.  Monitor urine output.  Avoid nephrotoxic agents.  Recheck labs in the morning.  Chronic systolic CHF Seems to be reasonably well compensated.  Diuretics and ACE inhibitor placed on hold due to worsening  renal function. Last echocardiogram is from 2022 which showed that he had normal left ventricular systolic function at that time.  Insulin-dependent diabetes mellitus HbA1c was 7.8 in April. Continue insulin.  Monitor CBGs.  SSI. Glargine dose was increased yesterday.  Monitor CBG.  Normocytic anemia Mild drop in hemoglobin is dilutional.  No evidence of overt bleeding.  Essential hypertension Monitor blood pressures closely.  ACE inhibitor placed on hold.  History of glaucoma Continue with eyedrops.  Obstructive sleep apnea Continue with CPAP.  Morbid obesity Estimated body mass index is 42.32 kg/m as calculated from the following:   Height as of this encounter: 5\' 9"  (1.753 m).   Weight as of this encounter: 130 kg.  DVT Prophylaxis: SQ heparin until later today. Code Status: Full code Family Communication: Discussed with patient and his wife Disposition Plan: To be determined  Status is: Inpatient Remains inpatient appropriate because: Need for IV antibiotics and surgery    Medications: Scheduled:  (feeding supplement) PROSource Plus  30 mL Oral TID BM   brimonidine  1 drop Both Eyes BID   And   timolol  1 drop Both Eyes BID   feeding supplement (GLUCERNA SHAKE)  237 mL Oral Q24H   heparin injection (subcutaneous)  5,000 Units Subcutaneous Q8H   insulin aspart  0-15 Units Subcutaneous TID WC   insulin aspart  0-5 Units Subcutaneous QHS   insulin glargine-yfgn  50 Units Subcutaneous QHS   latanoprost  1 drop Both Eyes QHS   linaclotide  290 mcg Oral QAC breakfast   metroNIDAZOLE  500 mg Oral Q12H   multivitamin with minerals  1 tablet Oral  Daily   nutrition supplement (JUVEN)  1 packet Oral BID BM   pantoprazole  40 mg Oral Daily   polyethylene glycol  17 g Oral Daily   simvastatin  20 mg Oral Daily   sodium chloride flush  3 mL Intravenous Q12H   Continuous:  sodium chloride 50 mL/hr at 02/17/23 1230   ceFEPime (MAXIPIME) IV 2 g (02/18/23 0839)    UJW:[id number] **OR** acetaminophen, fentaNYL (SUBLIMAZE) injection, oxyCODONE, senna-docusate  Antibiotics: Anti-infectives (From admission, onward)    Start     Dose/Rate Route Frequency Ordered Stop   02/15/23 1000  metroNIDAZOLE (FLAGYL) tablet 500 mg        500 mg Oral Every 12 hours 02/14/23 2313 02/22/23 0959   02/15/23 0100  ceFEPIme (MAXIPIME) 2 g in sodium chloride 0.9 % 100 mL IVPB        2 g 200 mL/hr over 30 Minutes Intravenous 2 times daily 02/14/23 2329     02/14/23 2200  cefTRIAXone (ROCEPHIN) 2 g in sodium chloride 0.9 % 100 mL IVPB  Status:  Discontinued       Placed in "And" Linked Group   2 g 200 mL/hr over 30 Minutes Intravenous Every 24 hours 02/14/23 2146 02/14/23 2313   02/14/23 2200  metroNIDAZOLE (FLAGYL) IVPB 500 mg  Status:  Discontinued       Placed in "And" Linked Group   500 mg 100 mL/hr over 60 Minutes Intravenous Every 12 hours 02/14/23 2146 02/14/23 2313       Objective:  Vital Signs  Vitals:   02/17/23 2048 02/18/23 0423 02/18/23 0500 02/18/23 0732  BP: (!) 144/73 127/65  (!) 141/76  Pulse: 73 70  72  Resp: 18 18  17   Temp: 98.2 F (36.8 C) 98.6 F (37 C)  98.4 F (36.9 C)  TempSrc:    Oral  SpO2: 100% 99%  100%  Weight:   130 kg   Height:        Intake/Output Summary (Last 24 hours) at 02/18/2023 0926 Last data filed at 02/17/2023 2200 Gross per 24 hour  Intake 603 ml  Output 200 ml  Net 403 ml   Filed Weights   02/14/23 2157 02/15/23 0500 02/18/23 0500  Weight: 123.4 kg 126 kg 130 kg   General appearance: Awake alert.  In no distress Resp: Clear to auscultation bilaterally.  Normal effort Cardio: S1-S2 is normal regular.  No S3-S4.  No rubs murmurs or bruit GI: Abdomen is soft.  Nontender nondistended.  Bowel sounds are present normal.  No masses organomegaly Extremities: Right foot is covered in dressing. Neurologic: No focal neurological deficits.      Lab Results:  Data Reviewed: I have personally reviewed  following labs and reports of the imaging studies  CBC: Recent Labs  Lab 02/14/23 1904 02/15/23 0037 02/16/23 0116 02/17/23 1118  WBC 13.8* 14.7* 13.6* 13.8*  NEUTROABS 11.3*  --   --   --   HGB 13.2 11.8* 11.5* 12.5*  HCT 41.3 37.8* 37.7* 39.9  MCV 79.7* 80.4 80.6 81.4  PLT 385 382 386 390    Basic Metabolic Panel: Recent Labs  Lab 02/14/23 1904 02/15/23 0037 02/16/23 0116 02/17/23 1118  NA 130* 135 135 137  K 4.3 4.2 4.3 4.6  CL 95* 99 105 107  CO2 21* 24 24 21*  GLUCOSE 306* 241* 196* 250*  BUN 70* 65* 46* 41*  CREATININE 2.11* 2.02* 1.81* 1.51*  CALCIUM 8.7* 8.6* 8.5* 8.7*    GFR: Estimated  Creatinine Clearance: 56.4 mL/min (A) (by C-G formula based on SCr of 1.51 mg/dL (H)).  Liver Function Tests: Recent Labs  Lab 02/14/23 1904  AST 21  ALT 18  ALKPHOS 80  BILITOT 0.6  PROT 7.5  ALBUMIN 3.0*     CBG: Recent Labs  Lab 02/17/23 1057 02/17/23 1551 02/17/23 2045 02/17/23 2303 02/18/23 0728  GLUCAP 221* 205* 217* 244* 141*     Radiology Studies: No results found.     LOS: 4 days   Edoardo Laforte [employer] on [email or web address]  02/18/2023, 9:26 AM

## 2023-02-18 NOTE — Progress Notes (Signed)
Pharmacy Antibiotic Note  Micheal Morris is a 77 y.o. male admitted on 02/14/2023 with R foot gangrene, h/o Pseudomonas infection.  Pharmacy consulted on 02/14/2023 for Cefepime dosing.  AKI on CKD IIIa: scr 2.1 on admit >>1.51 today.  IVFs:  1/2NS@50  ml/hr  Podiatry planning for OR/ second toe amputation on Sunday 7/21.  Cefepime 7/16>> ongoing Flagyl PO 7/16>> ongoing   7/16 Blood cx x2: No growth to date 7/16 [facility] (sent by podiatrist): strep agalactiae  Plan: Continue Cefepime 2 g IV q12h Monitor clinical status, renal function and culture results daily.  Follow up post op  Height: 5\' 9"  (175.3 cm) Weight: 130 kg (286 lb 9.6 oz) IBW/kg (Calculated) : 70.7  Temp (24hrs), Avg:98.3 F (36.8 C), Min:98 F (36.7 C), Max:98.6 F (37 C)  Recent Labs  Lab 02/14/23 1904 02/14/23 1909 02/15/23 0037 02/16/23 0116 02/17/23 1118  WBC 13.8*  --  14.7* 13.6* 13.8*  CREATININE 2.11*  --  2.02* 1.81* 1.51*  LATICACIDVEN  --  2.4* 1.8  --   --     Estimated Creatinine Clearance: 56.4 mL/min (A) (by C-G formula based on SCr of 1.51 mg/dL (H)).    Allergies  Allergen Reactions   Nsaids Other (See Comments)    GI bleed Hx of stomach ulcer   Aspirin Other (See Comments)    Hx of stomach ulcer Pt tolerates 81mg  EC tablets QD    Thank you for allowing pharmacy to be part of this patients care team.  Noah Delaine, RPh Clinical Pharmacist [phone] 02/18/2023 11:44 AM  Please check AMION for all [facility] Pharmacy phone numbers After 10:00 PM, call Main Pharmacy [phone]

## 2023-02-18 NOTE — Plan of Care (Signed)
  Problem: Nutritional: Goal: Maintenance of adequate nutrition will improve Outcome: Progressing   Problem: Education: Goal: Knowledge of General Education information will improve Description: Including pain rating scale, medication(s)/side effects and non-pharmacologic comfort measures Outcome: Progressing   Problem: Clinical Measurements: Goal: Will remain free from infection Outcome: Progressing   Problem: Activity: Goal: Risk for activity intolerance will decrease Outcome: Progressing   Problem: Coping: Goal: Level of anxiety will decrease Outcome: Progressing   Problem: Pain Managment: Goal: General experience of comfort will improve Outcome: Progressing

## 2023-02-19 ENCOUNTER — Inpatient Hospital Stay (HOSPITAL_COMMUNITY): Payer: Medicare PPO | Admitting: Anesthesiology

## 2023-02-19 ENCOUNTER — Encounter (HOSPITAL_COMMUNITY): Payer: Self-pay | Admitting: Family Medicine

## 2023-02-19 ENCOUNTER — Other Ambulatory Visit: Payer: Self-pay

## 2023-02-19 ENCOUNTER — Inpatient Hospital Stay (HOSPITAL_COMMUNITY): Payer: Medicare PPO

## 2023-02-19 ENCOUNTER — Encounter (HOSPITAL_COMMUNITY)
Admission: EM | Disposition: A | Discharge status: Home / Self Care | Payer: Self-pay | Source: Home / Self Care | Attending: Internal Medicine

## 2023-02-19 DIAGNOSIS — E11621 Type 2 diabetes mellitus with foot ulcer: Secondary | ICD-10-CM

## 2023-02-19 DIAGNOSIS — E1165 Type 2 diabetes mellitus with hyperglycemia: Secondary | ICD-10-CM | POA: Diagnosis not present

## 2023-02-19 DIAGNOSIS — L089 Local infection of the skin and subcutaneous tissue, unspecified: Secondary | ICD-10-CM | POA: Diagnosis not present

## 2023-02-19 DIAGNOSIS — I5032 Chronic diastolic (congestive) heart failure: Secondary | ICD-10-CM | POA: Diagnosis not present

## 2023-02-19 DIAGNOSIS — N179 Acute kidney failure, unspecified: Secondary | ICD-10-CM | POA: Diagnosis not present

## 2023-02-19 DIAGNOSIS — L97513 Non-pressure chronic ulcer of other part of right foot with necrosis of muscle: Secondary | ICD-10-CM

## 2023-02-19 DIAGNOSIS — I130 Hypertensive heart and chronic kidney disease with heart failure and stage 1 through stage 4 chronic kidney disease, or unspecified chronic kidney disease: Secondary | ICD-10-CM

## 2023-02-19 DIAGNOSIS — N183 Chronic kidney disease, stage 3 unspecified: Secondary | ICD-10-CM

## 2023-02-19 DIAGNOSIS — E11628 Type 2 diabetes mellitus with other skin complications: Secondary | ICD-10-CM | POA: Diagnosis not present

## 2023-02-19 DIAGNOSIS — L03115 Cellulitis of right lower limb: Secondary | ICD-10-CM

## 2023-02-19 HISTORY — PX: AMPUTATION TOE: SHX6595

## 2023-02-19 LAB — CULTURE, BLOOD (ROUTINE X 2)
Culture: NO GROWTH
Special Requests: ADEQUATE

## 2023-02-19 LAB — GLUCOSE, CAPILLARY
Glucose-Capillary: 137 mg/dL — ABNORMAL HIGH (ref 70–99)
Glucose-Capillary: 118 mg/dL — ABNORMAL HIGH (ref 70–99)
Glucose-Capillary: 105 mg/dL — ABNORMAL HIGH (ref 70–99)
Glucose-Capillary: 158 mg/dL — ABNORMAL HIGH (ref 70–99)
Glucose-Capillary: 291 mg/dL — ABNORMAL HIGH (ref 70–99)

## 2023-02-19 SURGERY — AMPUTATION, TOE
Anesthesia: Monitor Anesthesia Care | Site: Second Toe | Laterality: Right

## 2023-02-19 MED ORDER — OXYCODONE HCL 5 MG PO TABS: 5.0000 mg | ORAL_TABLET | Freq: Once | ORAL | Status: DC | PRN

## 2023-02-19 MED ORDER — FENTANYL CITRATE (PF) 100 MCG/2ML IJ SOLN: 25.0000 ug | INTRAMUSCULAR | Status: DC | PRN

## 2023-02-19 MED ORDER — BUPIVACAINE HCL (PF) 0.5 % IJ SOLN
INTRAMUSCULAR | Status: DC | PRN
  Administered 2023-02-19: 10 mL

## 2023-02-19 MED ORDER — PROPOFOL 10 MG/ML IV BOLUS
INTRAVENOUS | Status: DC | PRN
  Administered 2023-02-19: 50 ug/kg/min via INTRAVENOUS
  Administered 2023-02-19: 20 mg via INTRAVENOUS
  Administered 2023-02-19: 20 ug via INTRAVENOUS
  Administered 2023-02-19: 20 mg via INTRAVENOUS

## 2023-02-19 MED ORDER — 0.9 % SODIUM CHLORIDE (POUR BTL) OPTIME
TOPICAL | Status: DC | PRN
  Administered 2023-02-19: 1000 mL

## 2023-02-19 MED ORDER — CHLORHEXIDINE GLUCONATE 0.12 % MT SOLN: 15.0000 mL | Freq: Once | OROMUCOSAL | Status: AC

## 2023-02-19 MED ORDER — METRONIDAZOLE 500 MG/100ML IV SOLN
INTRAVENOUS | Status: AC
  Filled 2023-02-19: qty 100

## 2023-02-19 MED ORDER — DEXTROSE 5 % IV SOLN: INTRAVENOUS | Status: AC

## 2023-02-19 MED ORDER — INSULIN ASPART 100 UNIT/ML IJ SOLN: 0.0000 [IU] | INTRAMUSCULAR | Status: DC | PRN

## 2023-02-19 MED ORDER — OXYCODONE HCL 5 MG/5ML PO SOLN: 5.0000 mg | Freq: Once | ORAL | Status: DC | PRN

## 2023-02-19 MED ORDER — ACETAMINOPHEN 500 MG PO TABS
1000.0000 mg | ORAL_TABLET | Freq: Once | ORAL | Status: AC
  Administered 2023-02-19: 1000 mg via ORAL
  Filled 2023-02-19: qty 2

## 2023-02-19 MED ORDER — PROPOFOL 10 MG/ML IV BOLUS
INTRAVENOUS | Status: AC
  Filled 2023-02-19: qty 20

## 2023-02-19 MED ORDER — LACTATED RINGERS IV SOLN: INTRAVENOUS | Status: DC

## 2023-02-19 MED ORDER — BUPIVACAINE HCL (PF) 0.5 % IJ SOLN
INTRAMUSCULAR | Status: AC
  Filled 2023-02-19: qty 10

## 2023-02-19 MED ORDER — BUPIVACAINE HCL (PF) 0.5 % IJ SOLN
INTRAMUSCULAR | Status: AC
  Filled 2023-02-19: qty 30

## 2023-02-19 MED ORDER — ONDANSETRON HCL 4 MG/2ML IJ SOLN: 4.0000 mg | Freq: Once | INTRAMUSCULAR | Status: DC | PRN

## 2023-02-19 MED ORDER — ORAL CARE MOUTH RINSE: 15.0000 mL | Freq: Once | OROMUCOSAL | Status: AC

## 2023-02-19 MED ORDER — CHLORHEXIDINE GLUCONATE 0.12 % MT SOLN
OROMUCOSAL | Status: AC
  Administered 2023-02-19: 15 mL via OROMUCOSAL
  Filled 2023-02-19: qty 15

## 2023-02-19 SURGICAL SUPPLY — 35 items
BAG COUNTER SPONGE SURGICOUNT (BAG) IMPLANT
BAG SPNG CNTER NS LX DISP (BAG)
BNDG CMPR 5X4 KNIT ELC UNQ LF (GAUZE/BANDAGES/DRESSINGS) ×1
BNDG CMPR 9X4 STRL LF SNTH (GAUZE/BANDAGES/DRESSINGS)
BNDG COHESIVE 4X5 TAN STRL (GAUZE/BANDAGES/DRESSINGS) ×2 IMPLANT
BNDG ELASTIC 4INX 5YD STR LF (GAUZE/BANDAGES/DRESSINGS) IMPLANT
BNDG ELASTIC 4X5.8 VLCR STR LF (GAUZE/BANDAGES/DRESSINGS) ×2 IMPLANT
BNDG ESMARK 4X9 LF (GAUZE/BANDAGES/DRESSINGS) IMPLANT
BNDG GAUZE DERMACEA FLUFF 4 (GAUZE/BANDAGES/DRESSINGS) ×2 IMPLANT
BNDG GZE DERMACEA 4 6PLY (GAUZE/BANDAGES/DRESSINGS) ×1
COVER SURGICAL LIGHT HANDLE (MISCELLANEOUS) ×4 IMPLANT
CUFF TOURN SGL QUICK 18X4 (TOURNIQUET CUFF) ×2 IMPLANT
DRSG XEROFORM 1X8 (GAUZE/BANDAGES/DRESSINGS) IMPLANT
ELECT REM PT RETURN 9FT ADLT (ELECTROSURGICAL) ×1
ELECTRODE REM PT RTRN 9FT ADLT (ELECTROSURGICAL) ×2 IMPLANT
GAUZE PAD ABD 8X10 STRL (GAUZE/BANDAGES/DRESSINGS) ×2 IMPLANT
GAUZE SPONGE 4X4 12PLY STRL (GAUZE/BANDAGES/DRESSINGS) IMPLANT
GLOVE BIO SURGEON STRL SZ7 (GLOVE) ×2 IMPLANT
GLOVE BIOGEL PI IND STRL 7.5 (GLOVE) ×2 IMPLANT
GOWN STRL REUS W/ TWL LRG LVL3 (GOWN DISPOSABLE) ×4 IMPLANT
GOWN STRL REUS W/TWL LRG LVL3 (GOWN DISPOSABLE) ×2
HANDPIECE INTERPULSE COAX TIP (DISPOSABLE) ×1
IV NS 1000ML (IV SOLUTION) ×1
IV NS 1000ML BAXH (IV SOLUTION) ×2 IMPLANT
KIT BASIN OR (CUSTOM PROCEDURE TRAY) ×2 IMPLANT
KIT TURNOVER KIT B (KITS) ×2 IMPLANT
MANIFOLD NEPTUNE II (INSTRUMENTS) ×2 IMPLANT
NDL 22X1.5 STRL (OR ONLY) (MISCELLANEOUS) IMPLANT
NEEDLE 22X1.5 STRL (OR ONLY) (MISCELLANEOUS) IMPLANT
NS IRRIG 1000ML POUR BTL (IV SOLUTION) ×2 IMPLANT
PACK ORTHO EXTREMITY (CUSTOM PROCEDURE TRAY) ×2 IMPLANT
PAD ARMBOARD 7.5X6 YLW CONV (MISCELLANEOUS) ×4 IMPLANT
SET HNDPC FAN SPRY TIP SCT (DISPOSABLE) ×2 IMPLANT
SYR CONTROL 10ML LL (SYRINGE) IMPLANT
TOWEL GREEN STERILE (TOWEL DISPOSABLE) ×2 IMPLANT

## 2023-02-19 NOTE — Op Note (Signed)
Surgeon: Surgeon(s): Candelaria Stagers, DPM  Assistants: None Pre-operative diagnosis: cellulitis of foot  Post-operative diagnosis: same Procedure: Procedure(s) (LRB): SECOND TOE AMPUTATION (Right)  Pathology:  ID Type Source Tests Collected by Time Destination  1 : Right 2nd Toe Tissue PATH Digit amputation SURGICAL PATHOLOGY Candelaria Stagers, DPM 02/19/2023 1313     Pertinent Intra-op findings:  OM noted to the right second toe. No abscess noted on medial side of hallux Anesthesia: Monitor Anesthesia Care  Hemostasis: * No tourniquets in log * EBL: minimal  Materials: 3-0 prolene Injectables: 10 cc of 0.5% marcaine Complications: None  Indications for surgery: A 77 y.o. male presents with Right second digit osteomyelitis. Patient has failed all conservative therapy including but not limited to wound care IV antibiotics. He wishes to have surgical correction of the foot/deformity. It was determined that patient would benefit from right second digit amputation. Informed surgical risk consent was reviewed and read aloud to the patient.  I reviewed the films.  I have discussed my findings with the patient in great detail.  I have discussed all risks including but not limited to infection, stiffness, scarring, limp, disability, deformity, damage to blood vessels and nerves, numbness, poor healing, need for braces, arthritis, chronic pain, amputation, death.  All benefits and realistic expectations discussed in great detail.  I have made no promises as to the outcome.  I have provided realistic expectations.  I have offered the patient a 2nd opinion, which they have declined and assured me they preferred to proceed despite the risks   Procedure in detail: The patient was both verbally and visually identified by myself, the nursing staff, and anesthesia staff in the preoperative holding area. They were then transferred to the operating room and placed on the operative table in supine  position.  Using skin marker fishmouth style incision was delineated circumferential around the second digit metatarsophalangeal joint, using #15 blade incision was carried out from epidermal junction down to the level of the bone.  The second digit was disarticulated in standard technique.  Osteomyelitis noted in the proximal the distal margins of the toe.  No signs of osteomyelitis noted in the proximal margin.  The wound was thoroughly irrigated the wound was primarily closed with 3-0 Prolene.  All bleeding was ligated in standard technique.  The wound was dressed with Xeroform Curlex 4 x 4 gauze Ace bandage.   Disposition Patient is okay to be discharged from podiatric standpoint.  10 days of antibiotics/doxycycline.  Weightbearing as tolerated in surgical shoe.  Follow-up 1 week from DC.  No dressing change until follow-up  At the conclusion of the procedure the patient was awoken from anesthesia and found to have tolerated the procedure well any complications. There were transferred to PACU with vital signs stable and vascular status intact.  Nicholes Rough, DPM

## 2023-02-19 NOTE — Anesthesia Postprocedure Evaluation (Signed)
Anesthesia Post Note  Patient: Micheal Morris  Procedure(s) Performed: SECOND TOE AMPUTATION (Right: Second Toe)     Patient location during evaluation: PACU Anesthesia Type: MAC Level of consciousness: awake and alert Pain management: pain level controlled Vital Signs Assessment: post-procedure vital signs reviewed and stable Respiratory status: spontaneous breathing, nonlabored ventilation and respiratory function stable Cardiovascular status: stable and blood pressure returned to baseline Anesthetic complications: no   No notable events documented.  Last Vitals:  Vitals:   02/19/23 1428 02/19/23 1606  BP: 139/67 (!) 123/58  Pulse: 61 67  Resp: 18 18  Temp: 36.4 C 36.6 C  SpO2: 100% 100%    Last Pain:  Vitals:   02/19/23 1606  TempSrc: Oral  PainSc:                  [name]

## 2023-02-19 NOTE — Transfer of Care (Signed)
Immediate Anesthesia Transfer of Care Note  Patient: Micheal Morris  Procedure(s) Performed: SECOND TOE AMPUTATION (Right: Second Toe)  Patient Location: PACU  Anesthesia Type:MAC  Level of Consciousness: awake  Airway & Oxygen Therapy: Patient Spontanous Breathing  Post-op Assessment: Report given to RN and Post -op Vital signs reviewed and stable  Post vital signs: Reviewed and stable  Last Vitals:  Vitals Value Taken Time  BP 124/67 02/19/23 1337  Temp 36.4 C 02/19/23 1337  Pulse 66 02/19/23 1340  Resp 17 02/19/23 1340  SpO2 100 % 02/19/23 1340  Vitals shown include unfiled device data.  Last Pain:  Vitals:   02/19/23 1020  TempSrc: Oral  PainSc:       Patients Stated Pain Goal: 0 (02/16/23 2134)  Complications: No notable events documented.

## 2023-02-19 NOTE — Plan of Care (Signed)
  Problem: Health Behavior/Discharge Planning: Goal: Ability to manage health-related needs will improve Outcome: Progressing   Problem: Metabolic: Goal: Ability to maintain appropriate glucose levels will improve Outcome: Progressing   Problem: Nutritional: Goal: Maintenance of adequate nutrition will improve Outcome: Progressing   Problem: Skin Integrity: Goal: Risk for impaired skin integrity will decrease Outcome: Progressing   

## 2023-02-19 NOTE — Anesthesia Preprocedure Evaluation (Addendum)
Anesthesia Evaluation  Patient identified by MRN, date of birth, ID band Patient awake    Reviewed: Allergy & Precautions, H&P , NPO status , Patient's Chart, lab work & pertinent test results, reviewed documented beta blocker date and time   History of Anesthesia Complications (+) DIFFICULT AIRWAY and history of anesthetic complications  Airway Mallampati: II  TM Distance: >3 FB Neck ROM: full    Dental  (+) Dental Advisory Given, Teeth Intact   Pulmonary sleep apnea and Continuous Positive Airway Pressure Ventilation    Pulmonary exam normal        Cardiovascular hypertension, Pt. on medications and Pt. on home beta blockers Normal cardiovascular exam   '22 Myoperfusion - Nuclear stress EF: 65%. The left ventricular ejection fraction is normal (55-65%). This is a low risk study. No evidence of ischemia or previous infarction The study is normal.    Neuro/Psych  Neuromuscular disease    GI/Hepatic ,GERD  Medicated and Controlled,,  Endo/Other  diabetes, Type 2, Insulin Dependent  Morbid obesity  Renal/GU CRFRenal disease     Musculoskeletal  (+) Arthritis ,    Abdominal  (+) + obese  Peds  Hematology  (+) Blood dyscrasia, anemia   Anesthesia Other Findings No longer on GLP-1a   Reproductive/Obstetrics                             Anesthesia Physical Anesthesia Plan  ASA: 3  Anesthesia Plan: MAC   Post-op Pain Management: Regional block* and Tylenol PO (pre-op)*   Induction: Intravenous  PONV Risk Score and Plan: 1 and Ondansetron, Propofol infusion and Treatment may vary due to age or medical condition  Airway Management Planned: Simple Face Mask and Natural Airway  Additional Equipment: None  Intra-op Plan:   Post-operative Plan: Extubation in OR  Informed Consent: I have reviewed the patients History and Physical, chart, labs and discussed the procedure including the  risks, benefits and alternatives for the proposed anesthesia with the patient or authorized representative who has indicated his/her understanding and acceptance.       Plan Discussed with: CRNA and Anesthesiologist  Anesthesia Plan Comments:        Anesthesia Quick Evaluation

## 2023-02-19 NOTE — Progress Notes (Signed)
[facility] PROGRESS NOTE   Micheal Morris WGN:[id number] DOB: 07/27/1946 DOA: 02/14/2023  PCP: Eartha Inch, MD  Brief History/Interval Summary: 77 y.o. male with medical history significant for insulin-dependent diabetes mellitus, chronic diastolic CHF, hypertension, OSA on CPAP, and right toe ulcer followed by podiatry who was sent to the emergency department by his podiatrist due to worsening right second toe infection.  This is despite being on antibiotics in the outpatient setting.  Patient was hospitalized for further management.    Consultants: Podiatry   Procedures: None yet    Subjective/Interval History: No complaints offered.  Looking forward to surgery this morning.     Assessment/Plan:  Osteomyelitis involving right second toe/abscess Patient sent to the hospital by podiatry.  Blood cultures have been sent and negative so far.  Pseudomonas grew from his wound in April 2024. Patient was placed on cefepime.  His WBC was noted to be elevated.   MRI of the right foot was done which raised concern for osteomyelitis in the second digit.  They also saw a fluid collection along the medial aspect of the first metatarsal raising concern for an abscess. Plan is for surgery this morning. Pain is reasonably well-controlled.  Continue antibiotics.  WBC was noted to be elevated yesterday.  Will recheck labs tomorrow.  Acute kidney injury on chronic kidney disease stage IIIa Baseline creatinine is around 1.4.  He was given IV fluids in the emergency department.  Diuretics and ACE inhibitor currently on hold.  Avoid nephrotoxic agents.  Monitor urine output.   Renal function almost back to baseline.  Monitor urine output.  Avoid nephrotoxic agents.   Labs for tomorrow morning.  Chronic systolic CHF Seems to be reasonably well compensated.  Diuretics and ACE inhibitor placed on hold due to worsening renal function. Last echocardiogram is from 2022 which showed that he  had normal left ventricular systolic function at that time.  Insulin-dependent diabetes mellitus HbA1c was 7.8 in April. Continue insulin.  Monitor CBGs.  SSI. Glargine dose was increased.  Monitor CBG. Currently on D5 infusion since he is NPO.  Normocytic anemia Mild drop in hemoglobin is dilutional.  No evidence of overt bleeding.  Essential hypertension Monitor blood pressures closely.  ACE inhibitor placed on hold.  History of glaucoma Continue with eyedrops.  Obstructive sleep apnea Continue with CPAP.  Morbid obesity Estimated body mass index is 42.32 kg/m as calculated from the following:   Height as of this encounter: 5\' 9"  (1.753 m).   Weight as of this encounter: 130 kg.  DVT Prophylaxis: SQ heparin until later today. Code Status: Full code Family Communication: Discussed with patient and his wife Disposition Plan: To be determined  Status is: Inpatient Remains inpatient appropriate because: Need for IV antibiotics and surgery    Medications: Scheduled:  (feeding supplement) PROSource Plus  30 mL Oral TID BM   brimonidine  1 drop Both Eyes BID   And   timolol  1 drop Both Eyes BID   feeding supplement (GLUCERNA SHAKE)  237 mL Oral Q24H   insulin aspart  0-15 Units Subcutaneous TID WC   insulin aspart  0-5 Units Subcutaneous QHS   insulin glargine-yfgn  50 Units Subcutaneous QHS   latanoprost  1 drop Both Eyes QHS   linaclotide  290 mcg Oral QAC breakfast   metroNIDAZOLE  500 mg Oral Q12H   multivitamin with minerals  1 tablet Oral Daily   nutrition supplement (JUVEN)  1 packet Oral BID BM  pantoprazole  40 mg Oral Daily   polyethylene glycol  17 g Oral Daily   simvastatin  20 mg Oral Daily   sodium chloride flush  3 mL Intravenous Q12H   Continuous:  ceFEPime (MAXIPIME) IV 2 g (02/18/23 2122)   dextrose 50 mL/hr at 02/19/23 2952   WUX:[id number] **OR** acetaminophen, fentaNYL (SUBLIMAZE) injection, oxyCODONE,  senna-docusate  Antibiotics: Anti-infectives (From admission, onward)    Start     Dose/Rate Route Frequency Ordered Stop   02/15/23 1000  metroNIDAZOLE (FLAGYL) tablet 500 mg        500 mg Oral Every 12 hours 02/14/23 2313 02/22/23 0959   02/15/23 0100  ceFEPIme (MAXIPIME) 2 g in sodium chloride 0.9 % 100 mL IVPB        2 g 200 mL/hr over 30 Minutes Intravenous 2 times daily 02/14/23 2329     02/14/23 2200  cefTRIAXone (ROCEPHIN) 2 g in sodium chloride 0.9 % 100 mL IVPB  Status:  Discontinued       Placed in "And" Linked Group   2 g 200 mL/hr over 30 Minutes Intravenous Every 24 hours 02/14/23 2146 02/14/23 2313   02/14/23 2200  metroNIDAZOLE (FLAGYL) IVPB 500 mg  Status:  Discontinued       Placed in "And" Linked Group   500 mg 100 mL/hr over 60 Minutes Intravenous Every 12 hours 02/14/23 2146 02/14/23 2313       Objective:  Vital Signs  Vitals:   02/18/23 1325 02/18/23 1942 02/19/23 0603 02/19/23 0725  BP: (!) 143/68 (!) 145/66 134/70 136/70  Pulse: 71 73 64 74  Resp: 17 16 16 17   Temp: 98.4 F (36.9 C) 98.3 F (36.8 C) 98.4 F (36.9 C) 98.4 F (36.9 C)  TempSrc:  Oral Oral Oral  SpO2: 100% 100% 95% 98%  Weight:      Height:        Intake/Output Summary (Last 24 hours) at 02/19/2023 0909 Last data filed at 02/18/2023 1500 Gross per 24 hour  Intake 1300 ml  Output 400 ml  Net 900 ml   Filed Weights   02/14/23 2157 02/15/23 0500 02/18/23 0500  Weight: 123.4 kg 126 kg 130 kg    General appearance: Awake alert.  In no distress Resp: Clear to auscultation bilaterally.  Normal effort Cardio: S1-S2 is normal regular.  No S3-S4.  No rubs murmurs or bruit GI: Abdomen is soft.  Nontender nondistended.  Bowel sounds are present normal.  No masses organomegaly    Lab Results:  Data Reviewed: I have personally reviewed following labs and reports of the imaging studies  CBC: Recent Labs  Lab 02/14/23 1904 02/15/23 0037 02/16/23 0116 02/17/23 1118  WBC 13.8*  14.7* 13.6* 13.8*  NEUTROABS 11.3*  --   --   --   HGB 13.2 11.8* 11.5* 12.5*  HCT 41.3 37.8* 37.7* 39.9  MCV 79.7* 80.4 80.6 81.4  PLT 385 382 386 390    Basic Metabolic Panel: Recent Labs  Lab 02/14/23 1904 02/15/23 0037 02/16/23 0116 02/17/23 1118  NA 130* 135 135 137  K 4.3 4.2 4.3 4.6  CL 95* 99 105 107  CO2 21* 24 24 21*  GLUCOSE 306* 241* 196* 250*  BUN 70* 65* 46* 41*  CREATININE 2.11* 2.02* 1.81* 1.51*  CALCIUM 8.7* 8.6* 8.5* 8.7*    GFR: Estimated Creatinine Clearance: 56.4 mL/min (A) (by C-G formula based on SCr of 1.51 mg/dL (H)).  Liver Function Tests: Recent Labs  Lab 02/14/23 1904  AST 21  ALT 18  ALKPHOS 80  BILITOT 0.6  PROT 7.5  ALBUMIN 3.0*     CBG: Recent Labs  Lab 02/17/23 2303 02/18/23 0728 02/18/23 1124 02/18/23 1616 02/18/23 1945  GLUCAP 244* 141* 291* 213* 191*     Radiology Studies: No results found.     LOS: 5 days   [employer] on [email or web address]  02/19/2023, 9:09 AM

## 2023-02-19 NOTE — Plan of Care (Signed)
  Problem: Metabolic: Goal: Ability to maintain appropriate glucose levels will improve Outcome: Progressing   Problem: Skin Integrity: Goal: Risk for impaired skin integrity will decrease Outcome: Progressing   Problem: Clinical Measurements: Goal: Diagnostic test results will improve Outcome: Progressing   Problem: Elimination: Goal: Will not experience complications related to bowel motility Outcome: Progressing

## 2023-02-19 NOTE — Progress Notes (Signed)
Pt has home CPAP.  No assistance needed. 

## 2023-02-19 NOTE — Interval H&P Note (Signed)
History and Physical Interval Note:  02/19/2023 12:06 PM  Micheal Morris  has presented today for surgery, with the diagnosis of cellulitis of foot.  The various methods of treatment have been discussed with the patient and family. After consideration of risks, benefits and other options for treatment, the patient has consented to  Procedure(s): SECOND TOE AMPUTATION (Right) as a surgical intervention.  The patient's history has been reviewed, patient examined, no change in status, stable for surgery.  I have reviewed the patient's chart and labs.  Questions were answered to the patient's satisfaction.     Candelaria Stagers

## 2023-02-20 ENCOUNTER — Encounter (HOSPITAL_COMMUNITY): Payer: Self-pay | Admitting: Podiatry

## 2023-02-20 DIAGNOSIS — E11628 Type 2 diabetes mellitus with other skin complications: Secondary | ICD-10-CM | POA: Diagnosis not present

## 2023-02-20 DIAGNOSIS — L089 Local infection of the skin and subcutaneous tissue, unspecified: Secondary | ICD-10-CM | POA: Diagnosis not present

## 2023-02-20 LAB — CBC
HCT: 37.1 % — ABNORMAL LOW (ref 39.0–52.0)
Hemoglobin: 11.7 g/dL — ABNORMAL LOW (ref 13.0–17.0)
MCH: 25.4 pg — ABNORMAL LOW (ref 26.0–34.0)
MCHC: 31.5 g/dL (ref 30.0–36.0)
MCV: 80.5 fL (ref 80.0–100.0)
Platelets: 368 10*3/uL (ref 150–400)
RBC: 4.61 MIL/uL (ref 4.22–5.81)
RDW: 15.4 % (ref 11.5–15.5)
WBC: 14.4 10*3/uL — ABNORMAL HIGH (ref 4.0–10.5)
nRBC: 0 % (ref 0.0–0.2)

## 2023-02-20 LAB — BASIC METABOLIC PANEL
Anion gap: 6 (ref 5–15)
BUN: 31 mg/dL — ABNORMAL HIGH (ref 8–23)
CO2: 24 mmol/L (ref 22–32)
Calcium: 8.4 mg/dL — ABNORMAL LOW (ref 8.9–10.3)
Chloride: 103 mmol/L (ref 98–111)
Creatinine, Ser: 1.48 mg/dL — ABNORMAL HIGH (ref 0.61–1.24)
GFR, Estimated: 49 mL/min — ABNORMAL LOW (ref >60)
Glucose, Bld: 257 mg/dL — ABNORMAL HIGH (ref 70–99)
Potassium: 4.4 mmol/L (ref 3.5–5.1)
Sodium: 133 mmol/L — ABNORMAL LOW (ref 135–145)

## 2023-02-20 LAB — GLUCOSE, CAPILLARY
Glucose-Capillary: 133 mg/dL — ABNORMAL HIGH (ref 70–99)
Glucose-Capillary: 146 mg/dL — ABNORMAL HIGH (ref 70–99)

## 2023-02-20 MED ORDER — OXYCODONE HCL 5 MG PO TABS: 5.0000 mg | ORAL_TABLET | Freq: Four times a day (QID) | ORAL | 0 refills | Status: AC | PRN

## 2023-02-20 MED ORDER — AMOXICILLIN-POT CLAVULANATE 875-125 MG PO TABS: 1.0000 | ORAL_TABLET | Freq: Two times a day (BID) | ORAL | 0 refills | Status: AC

## 2023-02-20 NOTE — TOC Transition Note (Signed)
Transition of Care [facility]) - CM/SW Discharge Note   Patient Details  Name: Micheal Morris MRN: [id number] Date of Birth: [birth year]  Transition of Care  [facility]) CM/SW Contact:  Epifanio Lesches, RN Phone Number: 02/20/2023, 12:35 PM   Clinical Narrative:    Patient will DC to: home Anticipated DC date:02/20/2023 Family notified: yes, wife Transport by: car       -s/p SECOND TOE AMPUTATION (Right) , 7/21 Per MD patient ready for DC today. RN, patient, patient's wife notified of DC. Post hospital f/u noted on AVS. Pt without RX med concerns. DME : RW, provided to pt ( equipment received from unit 3C) Wife to provide transportation to home.  RNCM will sign off for now as intervention is no longer needed. Please consult [place] again if new needs arise.    Final next level of care: Home/Self Care Barriers to Discharge: No Barriers Identified   Patient Goals and CMS Choice   Choice offered to / list presented to : Patient  Discharge Placement                         Discharge Plan and Services Additional resources added to the After Visit Summary for                  DME Arranged: Walker rolling DME Agency: [employer] Date DME Agency Contacted: 02/20/23 Time DME Agency Contacted: 1234 Representative spoke with at DME Agency: RW received from unit 3c            Social Determinants of Health (SDOH) Interventions SDOH Screenings   Food Insecurity: Patient Declined (02/14/2023)  Housing: High Risk (02/14/2023)  Transportation Needs: Patient Declined (02/14/2023)  Utilities: Patient Declined (02/14/2023)  Financial Resource Strain: Low Risk  (01/24/2023)   Received from [facility], [facility]  Physical Activity: Insufficiently Active (01/24/2023)   Received from [facility]  Social Connections: Socially Integrated (01/24/2023)   Received [facility]  Stress: No Stress Concern Present (01/24/2023)   Received from [facility]  Tobacco Use: Low Risk  (02/19/2023)  Recent Concern: Tobacco Use - Medium Risk (02/09/2023)   Received from [facility]     Readmission Risk Interventions     No data to display

## 2023-02-20 NOTE — Plan of Care (Signed)
Patient educated on discharge paperwork, patient has no further questions. IV x2 removed. Walker obtained and at bedside.   Problem: Education: Goal: Ability to describe self-care measures that may prevent or decrease complications (Diabetes Survival Skills Education) will improve 02/20/2023 1221 by Evern Core, RN Outcome: Adequate for Discharge 02/20/2023 [phone] by Evern Core, RN Outcome: Progressing Goal: Individualized Educational Video(s) Outcome: Adequate for Discharge   Problem: Coping: Goal: Ability to adjust to condition or change in health will improve Outcome: Adequate for Discharge   Problem: Fluid Volume: Goal: Ability to maintain a balanced intake and output will improve Outcome: Adequate for Discharge   Problem: Health Behavior/Discharge Planning: Goal: Ability to identify and utilize available resources and services will improve Outcome: Adequate for Discharge Goal: Ability to manage health-related needs will improve Outcome: Adequate for Discharge   Problem: Metabolic: Goal: Ability to maintain appropriate glucose levels will improve Outcome: Adequate for Discharge   Problem: Nutritional: Goal: Maintenance of adequate nutrition will improve Outcome: Adequate for Discharge Goal: Progress toward achieving an optimal weight will improve Outcome: Adequate for Discharge   Problem: Skin Integrity: Goal: Risk for impaired skin integrity will decrease 02/20/2023 1221 by Evern Core, RN Outcome: Adequate for Discharge 02/20/2023 3086 by Evern Core, RN Outcome: Progressing   Problem: Tissue Perfusion: Goal: Adequacy of tissue perfusion will improve Outcome: Adequate for Discharge   Problem: Education: Goal: Knowledge of General Education information will improve Description: Including pain rating scale, medication(s)/side effects and non-pharmacologic comfort measures 02/20/2023 1221 by Evern Core, RN Outcome: Adequate for  Discharge 02/20/2023 5784 by Evern Core, RN Outcome: Progressing   Problem: Health Behavior/Discharge Planning: Goal: Ability to manage health-related needs will improve Outcome: Adequate for Discharge   Problem: Clinical Measurements: Goal: Ability to maintain clinical measurements within normal limits will improve Outcome: Adequate for Discharge Goal: Will remain free from infection Outcome: Adequate for Discharge Goal: Diagnostic test results will improve Outcome: Adequate for Discharge Goal: Respiratory complications will improve Outcome: Adequate for Discharge Goal: Cardiovascular complication will be avoided Outcome: Adequate for Discharge   Problem: Activity: Goal: Risk for activity intolerance will decrease 02/20/2023 1221 by Evern Core, RN Outcome: Adequate for Discharge 02/20/2023 [phone] by Evern Core, RN Outcome: Progressing   Problem: Nutrition: Goal: Adequate nutrition will be maintained Outcome: Adequate for Discharge   Problem: Coping: Goal: Level of anxiety will decrease Outcome: Adequate for Discharge   Problem: Elimination: Goal: Will not experience complications related to bowel motility Outcome: Adequate for Discharge Goal: Will not experience complications related to urinary retention Outcome: Adequate for Discharge   Problem: Pain Managment: Goal: General experience of comfort will improve Outcome: Adequate for Discharge   Problem: Safety: Goal: Ability to remain free from injury will improve Outcome: Adequate for Discharge   Problem: Skin Integrity: Goal: Risk for impaired skin integrity will decrease Outcome: Adequate for Discharge   Problem: Increased Nutrient Needs (NI-5.1) Goal: Food and/or nutrient delivery Description: Individualized approach for food/nutrient provision. Outcome: Adequate for Discharge   Problem: Education: Goal: Knowledge of General Education information will improve Description: Including pain  rating scale, medication(s)/side effects and non-pharmacologic comfort measures Outcome: Adequate for Discharge   Problem: Health Behavior/Discharge Planning: Goal: Ability to manage health-related needs will improve Outcome: Adequate for Discharge   Problem: Clinical Measurements: Goal: Ability to maintain clinical measurements within normal limits will improve Outcome: Adequate for Discharge Goal: Will remain free from infection Outcome: Adequate for Discharge Goal: Diagnostic test results will improve  Outcome: Adequate for Discharge Goal: Respiratory complications will improve Outcome: Adequate for Discharge Goal: Cardiovascular complication will be avoided Outcome: Adequate for Discharge   Problem: Activity: Goal: Risk for activity intolerance will decrease Outcome: Adequate for Discharge   Problem: Nutrition: Goal: Adequate nutrition will be maintained Outcome: Adequate for Discharge   Problem: Coping: Goal: Level of anxiety will decrease Outcome: Adequate for Discharge   Problem: Elimination: Goal: Will not experience complications related to bowel motility Outcome: Adequate for Discharge Goal: Will not experience complications related to urinary retention Outcome: Adequate for Discharge   Problem: Pain Managment: Goal: General experience of comfort will improve Outcome: Adequate for Discharge   Problem: Safety: Goal: Ability to remain free from injury will improve Outcome: Adequate for Discharge   Problem: Skin Integrity: Goal: Risk for impaired skin integrity will decrease Outcome: Adequate for Discharge

## 2023-02-20 NOTE — Plan of Care (Signed)
  Problem: Health Behavior/Discharge Planning: Goal: Ability to manage health-related needs will improve Outcome: Progressing   Problem: Metabolic: Goal: Ability to maintain appropriate glucose levels will improve Outcome: Progressing   Problem: Nutritional: Goal: Maintenance of adequate nutrition will improve Outcome: Progressing   

## 2023-02-20 NOTE — Plan of Care (Addendum)
Ortho Tech called to obtained ortho shoe, shoe at beside.  Problem: Education: Goal: Ability to describe self-care measures that may prevent or decrease complications (Diabetes Survival Skills Education) will improve Outcome: Progressing   Problem: Skin Integrity: Goal: Risk for impaired skin integrity will decrease Outcome: Progressing   Problem: Education: Goal: Knowledge of General Education information will improve Description: Including pain rating scale, medication(s)/side effects and non-pharmacologic comfort measures Outcome: Progressing   Problem: Activity: Goal: Risk for activity intolerance will decrease Outcome: Progressing

## 2023-02-20 NOTE — Progress Notes (Signed)
Mobility Specialist: Progress Note   02/20/23 1053  Mobility  Activity Ambulated with assistance in hallway  Level of Assistance Contact guard assist, steadying assist  Assistive Device Front wheel walker  Distance Ambulated (ft) 130 ft  RLE Weight Bearing WBAT  Activity Response Tolerated well  Mobility Referral Yes  $Mobility charge 1 Mobility  Mobility Specialist Start Time (ACUTE ONLY) 1015  Mobility Specialist Stop Time (ACUTE ONLY) 1030  Mobility Specialist Time Calculation (min) (ACUTE ONLY) 15 min   Pt was agreeable to mobility session. Received in recliner. Required SV for STS and CG during ambulation. No c/o throughout. Ambulation limited d/t pt bleeding through foot dressing - no c/o of pain. Returned pt to recliner with all needs met. RN notified.   Maurene Capes Mobility Specialist Please contact via SecureChat or Rehab office at [phone]

## 2023-02-20 NOTE — Progress Notes (Signed)
   Subjective: 1 Day Post-Op Procedure(s) (LRB): SECOND TOE AMPUTATION (Right) DOS: 02/19/2023  Objective: Vital signs in last 24 hours: Temp:  [97.6 F (36.4 C)-98.6 F (37 C)] 98.6 F (37 C) (07/22 0711) Pulse Rate:  [59-92] 72 (07/22 0711) Resp:  [13-18] 17 (07/22 0711) BP: (123-156)/(58-89) 132/68 (07/22 0711) SpO2:  [99 %-100 %] 100 % (07/22 0711)  Recent Labs    02/20/23 0218  HGB 11.7*   Recent Labs    02/20/23 0218  WBC 14.4*  RBC 4.61  HCT 37.1*  PLT 368   Recent Labs    02/20/23 0218  NA 133*  K 4.4  CL 103  CO2 24  BUN 31*  CREATININE 1.48*  GLUCOSE 257*  CALCIUM 8.4*   No results for input(s): "LABPT", "INR" in the last 72 hours.  Physical Exam: No active bleeding to the incision site.  Sutures well coapted.  No purulence, cellulitis or clinical sign of residual infection  Assessment/Plan: 1 Day Post-Op Procedure(s) (LRB): SECOND TOE AMPUTATION (Right) DOS: 02/19/2023.  Dr. Nicholes Rough  -Dressings changed.  Leave clean dry and intact until f/u in office 1 week post discharge with Dr. Allena Katz -Minimal WBAT in the postop shoe with the assistance of a walker.  Really for transition purposes and bathroom privileges only. -Rec discharge 1 week oral antibiotics.  Recommend Augmentin based on prior cultures taken 02/14/2023 -Podiatry will sign off  Felecia Shelling 02/20/2023, 11:33 AM  Felecia Shelling, DPM [facility]  Dr. Felecia Shelling, DPM    [address] [place], [place] [address]                Office [phone]  Fax [phone]

## 2023-02-20 NOTE — Progress Notes (Signed)
Orthopedic Tech Progress Note Patient Details:  Micheal Morris 1946-07-24 [id number]  Ortho Devices Type of Ortho Device: Postop shoe/boot Ortho Device/Splint Location: RLE Ortho Device/Splint Interventions: Ordered, Application, Adjustment   Post Interventions Patient Tolerated: Well Instructions Provided: Care of device  Donald Pore 02/20/2023, 9:13 AM

## 2023-02-20 NOTE — Care Management Important Message (Signed)
Important Message  Patient Details  Name: Micheal Morris MRN: [id number] Date of Birth: May 30, 1946   Medicare Important Message Given:  Yes     Sherilyn Banker 02/20/2023, 1:09 PM

## 2023-02-20 NOTE — Discharge Summary (Signed)
[facility]  Physician Discharge Summary   Patient ID: Micheal Morris MRN: [id number] DOB/AGE: 76-15-1947 76 y.o.  Admit date: 02/14/2023 Discharge date: 02/20/2023    PCP: Eartha Inch, MD  DISCHARGE DIAGNOSES:    Diabetic foot infection (HCC) Osteomyelitis right second toe   OSA (obstructive sleep apnea)   Essential hypertension   Diabetes mellitus with hyperglycemia, with long-term current use of insulin (HCC)   Acute renal failure superimposed on stage 3a chronic kidney disease (HCC)   Chronic heart failure with preserved ejection fraction (HFpEF) (HCC)   Cellulitis of foot excluding toe   RECOMMENDATIONS FOR OUTPATIENT FOLLOW UP: Outpatient follow-up with podiatry   Home Health: None Equipment/Devices: None  CODE STATUS: Full code  DISCHARGE CONDITION: fair  Diet recommendation: As before  INITIAL HISTORY: 77 y.o. male with medical history significant for insulin-dependent diabetes mellitus, chronic diastolic CHF, hypertension, OSA on CPAP, and right toe ulcer followed by podiatry who was sent to the emergency department by his podiatrist due to worsening right second toe infection.  This is despite being on antibiotics in the outpatient setting.  Patient was hospitalized for further management.     Consultants: Podiatry  HOSPITAL COURSE:   Osteomyelitis involving right second toe/abscess Patient sent to the hospital by podiatry.  Blood cultures have been sent and negative so far.  Pseudomonas grew from his wound in April 2024. Patient was placed on cefepime.  His WBC was noted to be elevated.   MRI of the right foot was done which raised concern for osteomyelitis in the second digit.  They also saw a fluid collection along the medial aspect of the first metatarsal raising concern for an abscess. Patient underwent second toe amputation.  Doing well postoperatively.  Did have some bleeding after surgery.  Dressing change done by podiatry this  morning.  Hemoglobin is stable.  Will be discharged on augmentin as recommended.   Acute kidney injury on chronic kidney disease stage IIIa Baseline creatinine is around 1.4.  He was given IV fluids in the emergency department.  Renal function back to baseline.  May resume diuretics at discharge.   Chronic systolic CHF Seems to be reasonably well compensated.  Last echocardiogram is from 2022 which showed that he had normal left ventricular systolic function at that time.   Insulin-dependent diabetes mellitus HbA1c was 7.8 in April.  Usual home medication regimen.   Normocytic anemia Mild drop in hemoglobin is dilutional.  Did have some bleeding from his operative site but no drop in hemoglobin noted.   Essential hypertension Continue home medications.   History of glaucoma Continue with eyedrops.   Obstructive sleep apnea Continue with CPAP.   Morbid obesity Estimated body mass index is 42.32 kg/m as calculated from the following:   Height as of this encounter: 5\' 9"  (1.753 m).   Weight as of this encounter: 130 kg.  Patient is stable.  Okay for discharge home today.   PERTINENT LABS:  The results of significant diagnostics from this hospitalization (including imaging, microbiology, ancillary and laboratory) are listed below for reference.    Microbiology: Recent Results (from the past 240 hour(s))  WOUND CULTURE     Status: Abnormal   Collection Time: 02/14/23  5:08 PM  Result Value Ref Range Status   MICRO NUMBER: [id number]  Final   SPECIMEN QUALITY: Adequate  Final   SOURCE: WOUND (SITE NOT SPECIFIED)  Final   STATUS: FINAL  Final   GRAM STAIN:   Final  Many White blood cells seen Many epithelial cells Many Gram positive cocci in clusters Many Gram negative bacilli   ISOLATE 1: Streptococcus agalactiae (A)  Final    Comment: Heavy growth of Group B Streptococcus isolated Beta-hemolytic streptococci are predictably susceptible to Penicillin and other beta-lactams.  Susceptibility testing not routinely performed. Please contact the laboratory within 3 days if susceptibility  testing is desired.   Blood Cultures x 2 sites     Status: None   Collection Time: 02/14/23  9:40 PM   Specimen: BLOOD  Result Value Ref Range Status   Specimen Description BLOOD SITE NOT SPECIFIED  Final   Special Requests   Final    BOTTLES DRAWN AEROBIC AND ANAEROBIC Blood Culture adequate volume   Culture   Final    NO GROWTH 5 DAYS Performed at [facility], [address], [place], [place] [address]    Report Status 02/19/2023 FINAL  Final  Blood Cultures x 2 sites     Status: None   Collection Time: 02/14/23  9:45 PM   Specimen: BLOOD  Result Value Ref Range Status   Specimen Description BLOOD SITE NOT SPECIFIED  Final   Special Requests   Final    BOTTLES DRAWN AEROBIC AND ANAEROBIC Blood Culture adequate volume   Culture   Final    NO GROWTH 5 DAYS Performed at [facility], [address] [place], [place] [address]    Report Status 02/19/2023 FINAL  Final  Surgical pcr screen     Status: None   Collection Time: 02/18/23  1:02 PM   Specimen: Nasal Mucosa; Nasal Swab  Result Value Ref Range Status   MRSA, PCR NEGATIVE NEGATIVE Final   Staphylococcus aureus NEGATIVE NEGATIVE Final    Comment: (NOTE) The Xpert SA Assay (FDA approved for NASAL specimens in patients 24 years of age and older), is one component of a comprehensive surveillance program. It is not intended to diagnose infection nor to guide or monitor treatment. Performed at [facility], [address], [place], [place] [address]      Labs:   Basic Metabolic Panel: Recent Labs  Lab 02/14/23 1904 02/15/23 0037 02/16/23 0116 02/17/23 1118 02/20/23 0218  NA 130* 135 135 137 133*  K 4.3 4.2 4.3 4.6 4.4  CL 95* 99 105 107 103  CO2 21* 24 24 21* 24  GLUCOSE 306* 241* 196* 250* 257*  BUN 70* 65* 46* 41* 31*  CREATININE 2.11* 2.02* 1.81* 1.51* 1.48*  CALCIUM 8.7* 8.6*  8.5* 8.7* 8.4*   Liver Function Tests: Recent Labs  Lab 02/14/23 1904  AST 21  ALT 18  ALKPHOS 80  BILITOT 0.6  PROT 7.5  ALBUMIN 3.0*    CBC: Recent Labs  Lab 02/14/23 1904 02/15/23 0037 02/16/23 0116 02/17/23 1118 02/20/23 0218  WBC 13.8* 14.7* 13.6* 13.8* 14.4*  NEUTROABS 11.3*  --   --   --   --   HGB 13.2 11.8* 11.5* 12.5* 11.7*  HCT 41.3 37.8* 37.7* 39.9 37.1*  MCV 79.7* 80.4 80.6 81.4 80.5  PLT 385 382 386 390 368     CBG: Recent Labs  Lab 02/19/23 1338 02/19/23 1603 02/19/23 2021 02/20/23 0709 02/20/23 1118  GLUCAP 105* 158* 291* 133* 146*     IMAGING STUDIES DG Foot Complete Right  Result Date: 02/19/2023 CLINICAL DATA:  Postop foot surgery.  Diabetic foot infection. EXAM: RIGHT FOOT COMPLETE - 3+ VIEW COMPARISON:  Radiographs 02/14/2023 and 12/19/2022.  MRI 02/15/2023. FINDINGS: Patient has undergone interval amputation of the 2nd toe. The 1st toe has been previously amputated. No evidence of acute fracture, dislocation or bone destruction. Mild midfoot degenerative changes are present. Soft tissue swelling noted medially in the forefoot without evidence of foreign body. IMPRESSION: Interval amputation of the 2nd toe. No acute osseous findings or radiographic evidence of osteomyelitis. Electronically Signed   By: Carey Bullocks M.D.   On: 02/19/2023 18:09   MR FOOT RIGHT WO CONTRAST  Result Date: 02/15/2023 CLINICAL DATA:  Soft tissue infection suspected. History of insulin-dependent diabetes. EXAM: MRI OF THE RIGHT FOREFOOT WITHOUT CONTRAST TECHNIQUE: Multiplanar, multisequence MR imaging of the right was performed. No intravenous contrast was administered. COMPARISON:  Radiographs dated February 14, 2019 FINDINGS: Bones/Joint/Cartilage Marrow edema of the proximal middle and distal phalanges of the second digit with deep skin wound about the dorsal aspect of the second digit concerning for osteomyelitis. Postsurgical changes for prior amputation through the  first metatarsophalangeal joint. Marrow signal within remaining osseous structures is within normal limits. Ligaments Lisfranc ligament is intact.  Collateral ligaments are maintained. Muscles and Tendons Increased intrasubstance signal of the plantar muscles suggesting diabetic myopathy/myositis. Soft tissues Soft tissue edema about the dorsum of the foot with a collection about medial aspect of the first metatarsal measuring a proximally 1.7 x 3.3 cm concerning for an abscess. IMPRESSION: 1. Marrow edema of the proximal, middle and distal phalanges of the second digit with deep skin wound about the dorsal aspect of the second digit concerning for osteomyelitis. 2. Fluid collection about the medial aspect of the first metatarsal measuring 1.7 x 3.3 cm concerning for an abscess. 3. Postsurgical changes for prior amputation through the first metatarsophalangeal joint. 4. Increased intrasubstance signal of the plantar muscles suggesting diabetic myopathy/myositis. Electronically Signed   By: Larose Hires D.O.   On: 02/15/2023 22:14   DG Foot Complete Right  Result Date: 02/15/2023 Please see detailed radiograph report in office note.   DISCHARGE EXAMINATION: Vitals:   02/19/23 2020 02/20/23 0009 02/20/23 0410 02/20/23 0711  BP: (!) 156/89 (!) 146/70 (!) 154/80 132/68  Pulse: 92 70 69 72  Resp: 14 17 16 17   Temp: 98.1 F (36.7 C) 98.2 F (36.8 C) 98 F (36.7 C) 98.6 F (37 C)  TempSrc:   Oral Oral  SpO2: 100% 100% 100% 100%  Weight:      Height:       General appearance: Awake alert.  In no distress Resp: Clear to auscultation bilaterally.  Normal effort Cardio: S1-S2 is normal regular.  No S3-S4.  No rubs murmurs or bruit GI: Abdomen is soft.  Nontender nondistended.  Bowel sounds are present normal.  No masses organomegaly    DISPOSITION: Home  Discharge Instructions     Call MD for:  difficulty breathing, headache or visual disturbances   Complete by: As directed    Call MD for:   extreme fatigue   Complete by: As directed    Call MD for:  hives   Complete by: As directed    Call MD for:  persistant dizziness or light-headedness   Complete by: As directed    Call MD for:  persistant nausea and vomiting   Complete by: As directed    Call MD for:  redness, tenderness, or signs of infection (pain, swelling, redness, odor or green/yellow discharge around incision site)   Complete by: As directed    Call MD for:  severe uncontrolled pain   Complete  by: As directed    Call MD for:  temperature >100.4   Complete by: As directed    Diet Carb Modified   Complete by: As directed    Discharge instructions   Complete by: As directed    Antibiotics as prescribed.  Follow-up with the podiatrist in 1 week.  Check your glucose levels at home.  You were cared for by a hospitalist during your hospital stay. If you have any questions about your discharge medications or the care you received while you were in the hospital after you are discharged, you can call the unit and asked to speak with the hospitalist on call if the hospitalist that took care of you is not available. Once you are discharged, your primary care physician will handle any further medical issues. Please note that NO REFILLS for any discharge medications will be authorized once you are discharged, as it is imperative that you return to your primary care physician (or establish a relationship with a primary care physician if you do not have one) for your aftercare needs so that they can reassess your need for medications and monitor your lab values. If you do not have a primary care physician, you can call [phone] for a physician referral.   Discharge wound care:   Complete by: As directed    As Instructed by the podiatrist.   Increase activity slowly   Complete by: As directed          Allergies as of 02/20/2023       Reactions   Nsaids Other (See Comments)   GI bleed Hx of stomach ulcer   Aspirin Other  (See Comments)   Hx of stomach ulcer Pt tolerates 81mg  EC tablets QD        Medication List     TAKE these medications    acetaminophen 325 MG tablet Commonly known as: TYLENOL Take 2 tablets (650 mg total) by mouth every 6 (six) hours as needed for mild pain, fever or headache.   ACIDOPHILUS PO Take 1 tablet by mouth daily.   amLODipine-benazepril 5-10 MG capsule Commonly known as: LOTREL Take 1 capsule by mouth daily.   amoxicillin-clavulanate 875-125 MG tablet Commonly known as: AUGMENTIN Take 1 tablet by mouth 2 (two) times daily for 10 days.   aspirin EC 81 MG tablet Take 81 mg by mouth daily.   bimatoprost 0.01 % Soln Commonly known as: LUMIGAN Place 1 drop into both eyes at bedtime.   CENTRUM SILVER PO Take 1 tablet by mouth daily.   Combigan 0.2-0.5 % ophthalmic solution Generic drug: brimonidine-timolol Place 1 drop into both eyes 2 (two) times daily.   docusate sodium 100 MG capsule Commonly known as: COLACE Take 100 mg by mouth daily as needed (constipation).   fluticasone 50 MCG/ACT nasal spray Commonly known as: FLONASE Place 1 spray into both nostrils daily.   gabapentin 300 MG capsule Commonly known as: NEURONTIN Take 300 mg by mouth daily.   gentamicin ointment 0.1 % Commonly known as: GARAMYCIN Apply 1 Application topically daily.   GNP GINGKO BILOBA EXTRACT PO Take 1 tablet by mouth daily.   Jardiance 25 MG Tabs tablet Generic drug: empagliflozin Take 25 mg by mouth daily.   Lantus SoloStar 100 UNIT/ML Solostar Pen Generic drug: insulin glargine Inject 90 Units into the skin daily.   levocetirizine 5 MG tablet Commonly known as: XYZAL Take 5 mg by mouth every evening.   Linzess 290 MCG Caps capsule Generic drug: linaclotide Take  290 mcg by mouth daily before breakfast.   metoprolol succinate 100 MG 24 hr tablet Commonly known as: TOPROL-XL Take 100 mg by mouth every evening.   mirabegron ER 50 MG Tb24 tablet Commonly  known as: MYRBETRIQ Take 50 mg by mouth daily.   NovoLOG FlexPen 100 UNIT/ML FlexPen Generic drug: insulin aspart Inject 15-30 Units into the skin See admin instructions. Inject 10-15 units with breakfast, 20-30 units with lunch and supper (per sliding scale)   oxyCODONE 5 MG immediate release tablet Commonly known as: Oxy IR/ROXICODONE Take 1 tablet (5 mg total) by mouth every 6 (six) hours as needed for severe pain.   pantoprazole 40 MG tablet Commonly known as: PROTONIX Take 40 mg by mouth daily.   polyethylene glycol 17 g packet Commonly known as: MiraLax Take 17 g by mouth daily. What changed:  when to take this reasons to take this   Santyl 250 UNIT/GM ointment Generic drug: collagenase Apply 1 Application topically daily.   simvastatin 20 MG tablet Commonly known as: ZOCOR Take 20 mg by mouth daily.   spironolactone 25 MG tablet Commonly known as: ALDACTONE Take 25 mg by mouth daily.   torsemide 20 MG tablet Commonly known as: DEMADEX Take 20 mg by mouth daily.   TURMERIC PO Take 1 tablet by mouth daily.   Victoza 18 MG/3ML Sopn Generic drug: liraglutide Inject 1.8 mg into the skin daily.               Durable Medical Equipment  (From admission, onward)           Start     Ordered   02/20/23 1229  For home use only DME Walker rolling  Once       Question Answer Comment  Walker: With 5 Inch Wheels   Patient needs a walker to treat with the following condition Amputation of one or more toes (HCC)      02/20/23 1230              Discharge Care Instructions  (From admission, onward)           Start     Ordered   02/20/23 0000  Discharge wound care:       Comments: As Instructed by the podiatrist.   02/20/23 1009              Follow-up Information     Eartha Inch, MD Follow up.   Specialty: Family Medicine Contact information: [address] [place] [address] [phone]          Candelaria Stagers, DPM Follow up.   Specialty: Podiatry Contact information: [address] [place] [address] [phone]                 TOTAL DISCHARGE TIME: 35 minutes  Paquita Printy Rito Ehrlich  [facility] Pager on [email or web address]  02/20/2023, 5:22 PM

## 2023-02-21 LAB — SURGICAL PATHOLOGY

## 2023-03-01 ENCOUNTER — Ambulatory Visit (INDEPENDENT_AMBULATORY_CARE_PROVIDER_SITE_OTHER): Payer: Medicare PPO | Admitting: Podiatry

## 2023-03-01 DIAGNOSIS — Z89421 Acquired absence of other right toe(s): Secondary | ICD-10-CM | POA: Diagnosis not present

## 2023-03-01 NOTE — Progress Notes (Signed)
Subjective:  Patient ID: Micheal Morris, male    DOB: Aug 06, 1945,  MRN: [id number]  Chief Complaint  Patient presents with   Routine Post Op    DOS: 02/19/2023 Procedure: Right second digit amputation  77 y.o. male returns for post-op check.  Patient states that is doing well.  Bandages clean dry and intact.  No signs of Deis is noted no complication noted.  Review of Systems: Negative except as noted in the HPI. Denies N/V/F/Ch.  Past Medical History:  Diagnosis Date   Arthritis    "knees" (01/01/2016)   Cataract    OS   CHF (congestive heart failure) (HCC)    Chronic kidney disease    Diabetes mellitus (HCC)    Diabetic retinopathy (HCC)    NPDR OU   Difficult intubation    Erectile dysfunction    GERD (gastroesophageal reflux disease)    Glaucoma    OS   HTN (hypertension)    Hyperlipidemia    Neuromuscular disorder (HCC)    neuropathy   Obesity    OSA on CPAP    Pain of left upper arm    Restrictive lung disease    Sensory hearing loss, bilateral    Sleep apnea    SOBOE (shortness of breath on exertion) 11/03/2015   currently goes to gym   Type II diabetes mellitus (HCC)     Current Outpatient Medications:    acetaminophen (TYLENOL) 325 MG tablet, Take 2 tablets (650 mg total) by mouth every 6 (six) hours as needed for mild pain, fever or headache., Disp: , Rfl:    amLODipine-benazepril (LOTREL) 5-10 MG capsule, Take 1 capsule by mouth daily., Disp: , Rfl:    aspirin EC 81 MG tablet, Take 81 mg by mouth daily., Disp: , Rfl:    bimatoprost (LUMIGAN) 0.01 % SOLN, Place 1 drop into both eyes at bedtime., Disp: , Rfl:    brimonidine-timolol (COMBIGAN) 0.2-0.5 % ophthalmic solution, Place 1 drop into both eyes 2 (two) times daily., Disp: , Rfl:    collagenase (SANTYL) 250 UNIT/GM ointment, Apply 1 Application topically daily., Disp: 15 g, Rfl: 0   docusate sodium (COLACE) 100 MG capsule, Take 100 mg by mouth daily as needed (constipation)., Disp: , Rfl:     fluticasone (FLONASE) 50 MCG/ACT nasal spray, Place 1 spray into both nostrils daily., Disp: , Rfl:    gabapentin (NEURONTIN) 300 MG capsule, Take 300 mg by mouth daily., Disp: , Rfl:    gentamicin ointment (GARAMYCIN) 0.1 %, Apply 1 Application topically daily., Disp: 30 g, Rfl: 0   Ginkgo Biloba (GNP GINGKO BILOBA EXTRACT PO), Take 1 tablet by mouth daily., Disp: , Rfl:    JARDIANCE 25 MG TABS tablet, Take 25 mg by mouth daily., Disp: , Rfl: 11   Lactobacillus (ACIDOPHILUS PO), Take 1 tablet by mouth daily., Disp: , Rfl:    LANTUS SOLOSTAR 100 UNIT/ML Solostar Pen, Inject 90 Units into the skin daily., Disp: , Rfl:    levocetirizine (XYZAL) 5 MG tablet, Take 5 mg by mouth every evening., Disp: , Rfl:    linaclotide (LINZESS) 290 MCG CAPS capsule, Take 290 mcg by mouth daily before breakfast., Disp: , Rfl:    liraglutide (VICTOZA) 18 MG/3ML SOPN, Inject 1.8 mg into the skin daily., Disp: , Rfl:    metoprolol succinate (TOPROL-XL) 100 MG 24 hr tablet, Take 100 mg by mouth every evening., Disp: , Rfl:    mirabegron ER (MYRBETRIQ) 50 MG TB24 tablet, Take 50 mg by  mouth daily., Disp: , Rfl:    Multiple Vitamins-Minerals (CENTRUM SILVER PO), Take 1 tablet by mouth daily., Disp: , Rfl:    NOVOLOG FLEXPEN 100 UNIT/ML FlexPen, Inject 15-30 Units into the skin See admin instructions. Inject 10-15 units with breakfast, 20-30 units with lunch and supper (per sliding scale), Disp: , Rfl:    oxyCODONE (OXY IR/ROXICODONE) 5 MG immediate release tablet, Take 1 tablet (5 mg total) by mouth every 6 (six) hours as needed for severe pain., Disp: 20 tablet, Rfl: 0   pantoprazole (PROTONIX) 40 MG tablet, Take 40 mg by mouth daily., Disp: , Rfl:    polyethylene glycol (MIRALAX) 17 g packet, Take 17 g by mouth daily. (Patient taking differently: Take 17 g by mouth daily as needed (constipation).), Disp: 14 each, Rfl: 0   simvastatin (ZOCOR) 20 MG tablet, Take 20 mg by mouth daily., Disp: , Rfl:    spironolactone  (ALDACTONE) 25 MG tablet, Take 25 mg by mouth daily. , Disp: , Rfl:    torsemide (DEMADEX) 20 MG tablet, Take 20 mg by mouth daily., Disp: , Rfl:    TURMERIC PO, Take 1 tablet by mouth daily., Disp: , Rfl:   Social History   Tobacco Use  Smoking Status Never  Smokeless Tobacco Never    Allergies  Allergen Reactions   Nsaids Other (See Comments)    GI bleed Hx of stomach ulcer   Aspirin Other (See Comments)    Hx of stomach ulcer Pt tolerates 81mg  EC tablets QD   Objective:  There were no vitals filed for this visit. There is no height or weight on file to calculate BMI. Constitutional Well developed. Well nourished.  Vascular Foot warm and well perfused. Capillary refill normal to all digits.   Neurologic Normal speech. Oriented to person, place, and time. Epicritic sensation to light touch grossly present bilaterally.  Dermatologic Skin healing well without signs of infection. Skin edges well coapted without signs of infection.  Orthopedic: Tenderness to palpation noted about the surgical site.   Radiographs: No.ne Assessment:   1. History of amputation of lesser toe of right foot (HCC)    Plan:  Patient was evaluated and treated and all questions answered.  S/p foot surgery right -Progressing as expected post-operatively. -XR: See above -WB Status: Weightbearing as tolerated.  In surgical shoe  -Sutures: Intact.  No clinical signs of Deis and no no complication noted. -Medications: None -Foot redressed.  No follow-ups on file.

## 2023-03-02 ENCOUNTER — Ambulatory Visit: Payer: Medicare PPO | Admitting: Podiatry

## 2023-03-15 ENCOUNTER — Ambulatory Visit (INDEPENDENT_AMBULATORY_CARE_PROVIDER_SITE_OTHER): Payer: Medicare PPO | Admitting: Podiatry

## 2023-03-15 DIAGNOSIS — Z89421 Acquired absence of other right toe(s): Secondary | ICD-10-CM

## 2023-03-15 NOTE — Progress Notes (Signed)
Subjective:  Patient ID: Micheal Morris, male    DOB: 08-17-1945,  MRN: [id number]  No chief complaint on file.   DOS: 02/19/2023 Procedure: Right second digit amputation  77 y.o. male returns for post-op check.  Patient states that is doing well.  Bandages clean dry and intact.  No signs of Deis is noted no complication noted.  Review of Systems: Negative except as noted in the HPI. Denies N/V/F/Ch.  Past Medical History:  Diagnosis Date   Arthritis    "knees" (01/01/2016)   Cataract    OS   CHF (congestive heart failure) (HCC)    Chronic kidney disease    Diabetes mellitus (HCC)    Diabetic retinopathy (HCC)    NPDR OU   Difficult intubation    Erectile dysfunction    GERD (gastroesophageal reflux disease)    Glaucoma    OS   HTN (hypertension)    Hyperlipidemia    Neuromuscular disorder (HCC)    neuropathy   Obesity    OSA on CPAP    Pain of left upper arm    Restrictive lung disease    Sensory hearing loss, bilateral    Sleep apnea    SOBOE (shortness of breath on exertion) 11/03/2015   currently goes to gym   Type II diabetes mellitus (HCC)     Current Outpatient Medications:    acetaminophen (TYLENOL) 325 MG tablet, Take 2 tablets (650 mg total) by mouth every 6 (six) hours as needed for mild pain, fever or headache., Disp: , Rfl:    amLODipine-benazepril (LOTREL) 5-10 MG capsule, Take 1 capsule by mouth daily., Disp: , Rfl:    aspirin EC 81 MG tablet, Take 81 mg by mouth daily., Disp: , Rfl:    bimatoprost (LUMIGAN) 0.01 % SOLN, Place 1 drop into both eyes at bedtime., Disp: , Rfl:    brimonidine-timolol (COMBIGAN) 0.2-0.5 % ophthalmic solution, Place 1 drop into both eyes 2 (two) times daily., Disp: , Rfl:    collagenase (SANTYL) 250 UNIT/GM ointment, Apply 1 Application topically daily., Disp: 15 g, Rfl: 0   docusate sodium (COLACE) 100 MG capsule, Take 100 mg by mouth daily as needed (constipation)., Disp: , Rfl:    fluticasone (FLONASE) 50 MCG/ACT nasal  spray, Place 1 spray into both nostrils daily., Disp: , Rfl:    gabapentin (NEURONTIN) 300 MG capsule, Take 300 mg by mouth daily., Disp: , Rfl:    gentamicin ointment (GARAMYCIN) 0.1 %, Apply 1 Application topically daily., Disp: 30 g, Rfl: 0   Ginkgo Biloba (GNP GINGKO BILOBA EXTRACT PO), Take 1 tablet by mouth daily., Disp: , Rfl:    JARDIANCE 25 MG TABS tablet, Take 25 mg by mouth daily., Disp: , Rfl: 11   Lactobacillus (ACIDOPHILUS PO), Take 1 tablet by mouth daily., Disp: , Rfl:    LANTUS SOLOSTAR 100 UNIT/ML Solostar Pen, Inject 90 Units into the skin daily., Disp: , Rfl:    levocetirizine (XYZAL) 5 MG tablet, Take 5 mg by mouth every evening., Disp: , Rfl:    linaclotide (LINZESS) 290 MCG CAPS capsule, Take 290 mcg by mouth daily before breakfast., Disp: , Rfl:    liraglutide (VICTOZA) 18 MG/3ML SOPN, Inject 1.8 mg into the skin daily., Disp: , Rfl:    metoprolol succinate (TOPROL-XL) 100 MG 24 hr tablet, Take 100 mg by mouth every evening., Disp: , Rfl:    mirabegron ER (MYRBETRIQ) 50 MG TB24 tablet, Take 50 mg by mouth daily., Disp: , Rfl:  Multiple Vitamins-Minerals (CENTRUM SILVER PO), Take 1 tablet by mouth daily., Disp: , Rfl:    NOVOLOG FLEXPEN 100 UNIT/ML FlexPen, Inject 15-30 Units into the skin See admin instructions. Inject 10-15 units with breakfast, 20-30 units with lunch and supper (per sliding scale), Disp: , Rfl:    oxyCODONE (OXY IR/ROXICODONE) 5 MG immediate release tablet, Take 1 tablet (5 mg total) by mouth every 6 (six) hours as needed for severe pain., Disp: 20 tablet, Rfl: 0   pantoprazole (PROTONIX) 40 MG tablet, Take 40 mg by mouth daily., Disp: , Rfl:    polyethylene glycol (MIRALAX) 17 g packet, Take 17 g by mouth daily. (Patient taking differently: Take 17 g by mouth daily as needed (constipation).), Disp: 14 each, Rfl: 0   simvastatin (ZOCOR) 20 MG tablet, Take 20 mg by mouth daily., Disp: , Rfl:    spironolactone (ALDACTONE) 25 MG tablet, Take 25 mg by mouth  daily. , Disp: , Rfl:    torsemide (DEMADEX) 20 MG tablet, Take 20 mg by mouth daily., Disp: , Rfl:    TURMERIC PO, Take 1 tablet by mouth daily., Disp: , Rfl:   Social History   Tobacco Use  Smoking Status Never  Smokeless Tobacco Never    Allergies  Allergen Reactions   Nsaids Other (See Comments)    GI bleed Hx of stomach ulcer   Aspirin Other (See Comments)    Hx of stomach ulcer Pt tolerates 81mg  EC tablets QD   Objective:  There were no vitals filed for this visit. There is no height or weight on file to calculate BMI. Constitutional Well developed. Well nourished.  Vascular Foot warm and well perfused. Capillary refill normal to all digits.   Neurologic Normal speech. Oriented to person, place, and time. Epicritic sensation to light touch grossly present bilaterally.  Dermatologic Skin completely epithelialized.  No signs of dehiscence noted.  No infection noted  Orthopedic: No further tenderness to palpation noted about the surgical site.   Radiographs: No.ne Assessment:   No diagnosis found.  Plan:  Patient was evaluated and treated and all questions answered.  S/p foot surgery right -Niccoli healed and officially discharged from my care.  If any foot and ankle issues arise in the future he will come back and see me.  I discussed shoe gear modification he states understanding  No follow-ups on file.

## 2023-03-21 ENCOUNTER — Encounter: Payer: Self-pay | Admitting: Podiatry

## 2023-03-21 ENCOUNTER — Ambulatory Visit (INDEPENDENT_AMBULATORY_CARE_PROVIDER_SITE_OTHER): Payer: Medicare PPO | Admitting: Podiatry

## 2023-03-21 DIAGNOSIS — M79675 Pain in left toe(s): Secondary | ICD-10-CM

## 2023-03-21 DIAGNOSIS — M79674 Pain in right toe(s): Secondary | ICD-10-CM

## 2023-03-21 DIAGNOSIS — E1165 Type 2 diabetes mellitus with hyperglycemia: Secondary | ICD-10-CM | POA: Diagnosis not present

## 2023-03-21 DIAGNOSIS — B351 Tinea unguium: Secondary | ICD-10-CM | POA: Diagnosis not present

## 2023-03-21 DIAGNOSIS — Z794 Long term (current) use of insulin: Secondary | ICD-10-CM

## 2023-03-21 DIAGNOSIS — Z89419 Acquired absence of unspecified great toe: Secondary | ICD-10-CM

## 2023-03-21 NOTE — Progress Notes (Signed)
Patient ID: Micheal Morris, male   DOB: 04-19-1946, 77 y.o.   MRN: [id number] Complaint:  Visit Type: Patient returns to my office for continued preventative foot care services. Complaint: Patient states" my nails have grown long and thick and become painful to walk and wear shoes" Patient has been diagnosed with DM with  neuropathy.. The patient presents for preventative foot care services.  He is under care by Dr.  Allena Katz for amputation 1,2 right foot.   He presents to the office nail care.    Podiatric Exam: Vascular: dorsalis pedis and posterior tibial pulses are weakly  palpable bilateral. Capillary return is immediate. Temperature gradient is WNL. Skin turgor WNL  Sensorium: Normal Semmes Weinstein monofilament test. Normal tactile sensation bilaterally. Nail Exam: Pt has thick disfigured discolored nails with subungual debris noted entire  hallux through fifth toenails left foot. Debride nails 3-5  right foot. Ulcer Exam: There is evidence of peeling and healing of ulcer on the tip of his right hallux. Orthopedic Exam: Muscle tone and strength are WNL. No limitations in general ROM. No crepitus or effusions noted. Foot type and digits show no abnormalities. Bony prominences are unremarkable. Skin: No Porokeratosis. No infection or ulcers.     Diagnosis:  Onychomycosis, , Pain in right toe, pain in left toes  Treatment & Plan Procedures and Treatment: Consent by patient was obtained for treatment procedures. The patient understood the discussion of treatment and procedures well. All questions were answered thoroughly reviewed. Debridement of mycotic and hypertrophic toenails, 1 through 5 left  and 3-5 right with clearing of subungual debris.  Return Visit-Office Procedure: Patient instructed to return to the office for a follow up visit 12  weeks  for continued evaluation and treatment.  Helane Gunther DPM

## 2023-06-21 ENCOUNTER — Ambulatory Visit: Payer: Medicare PPO | Admitting: Podiatry

## 2023-06-21 ENCOUNTER — Encounter: Payer: Self-pay | Admitting: Podiatry

## 2023-06-21 DIAGNOSIS — E1165 Type 2 diabetes mellitus with hyperglycemia: Secondary | ICD-10-CM

## 2023-06-21 DIAGNOSIS — Z89419 Acquired absence of unspecified great toe: Secondary | ICD-10-CM

## 2023-06-21 DIAGNOSIS — M79675 Pain in left toe(s): Secondary | ICD-10-CM

## 2023-06-21 DIAGNOSIS — B351 Tinea unguium: Secondary | ICD-10-CM | POA: Diagnosis not present

## 2023-06-21 DIAGNOSIS — M79674 Pain in right toe(s): Secondary | ICD-10-CM

## 2023-06-21 DIAGNOSIS — Z794 Long term (current) use of insulin: Secondary | ICD-10-CM

## 2023-06-21 NOTE — Progress Notes (Signed)
Patient ID: Micheal Morris, male   DOB: [birth year], 77 y.o.   MRN: [id number] Complaint:  Visit Type: Patient returns to my office for continued preventative foot care services. Complaint: Patient states" my nails have grown long and thick and become painful to walk and wear shoes" Patient has been diagnosed with DM with  neuropathy.. The patient presents for preventative foot care services.  He is under care by Dr.  Allena Katz for amputation 1,2 right foot.   He presents to the office nail care.    Podiatric Exam: Vascular: dorsalis pedis and posterior tibial pulses are weakly  palpable bilateral. Capillary return is immediate. Temperature gradient is WNL. Skin turgor WNL  Sensorium: Normal Semmes Weinstein monofilament test. Normal tactile sensation bilaterally. Nail Exam: Pt has thick disfigured discolored nails with subungual debris noted entire  hallux through fifth toenails left foot. Debride nails 3-5  right foot. Ulcer Exam: No evidence. Orthopedic Exam: Muscle tone and strength are WNL. No limitations in general ROM. No crepitus or effusions noted. Foot type and digits show no abnormalities. Bony prominences are unremarkable. Amputation 1,2 right foot. Skin: No Porokeratosis. No infection or ulcers.     Diagnosis:  Onychomycosis, , Pain in right toe, pain in left toes  Treatment & Plan Procedures and Treatment: Consent by patient was obtained for treatment procedures. The patient understood the discussion of treatment and procedures well. All questions were answered thoroughly reviewed. Debridement of mycotic and hypertrophic toenails, 1 through 5 left  and 3-5 right with clearing of subungual debris.  Return Visit-Office Procedure: Patient instructed to return to the office for a follow up visit 10  weeks  for continued evaluation and treatment.  Helane Gunther DPM

## 2023-08-30 ENCOUNTER — Ambulatory Visit (INDEPENDENT_AMBULATORY_CARE_PROVIDER_SITE_OTHER): Payer: Medicare PPO

## 2023-08-30 ENCOUNTER — Encounter: Payer: Self-pay | Admitting: Podiatry

## 2023-08-30 ENCOUNTER — Ambulatory Visit: Payer: Medicare PPO | Admitting: Podiatry

## 2023-08-30 DIAGNOSIS — L97512 Non-pressure chronic ulcer of other part of right foot with fat layer exposed: Secondary | ICD-10-CM | POA: Diagnosis not present

## 2023-08-30 DIAGNOSIS — E119 Type 2 diabetes mellitus without complications: Secondary | ICD-10-CM | POA: Diagnosis not present

## 2023-08-30 DIAGNOSIS — M778 Other enthesopathies, not elsewhere classified: Secondary | ICD-10-CM | POA: Diagnosis not present

## 2023-08-30 MED ORDER — GENTAMICIN SULFATE 0.1 % EX CREA: 1.0000 | TOPICAL_CREAM | Freq: Two times a day (BID) | CUTANEOUS | 1 refills | Status: DC

## 2023-08-30 MED ORDER — DOXYCYCLINE HYCLATE 100 MG PO TABS: 100.0000 mg | ORAL_TABLET | Freq: Two times a day (BID) | ORAL | 0 refills | Status: DC

## 2023-08-30 NOTE — Progress Notes (Signed)
Chief Complaint  Patient presents with   Diabetes    [facility]    Subjective:  78 y.o. male with PMHx of diabetes mellitus and prior toe amputations to the great toe and second digit of the right foot presenting today for evaluation of an ulcer to the distal tip of the right third toe.  Patient was unaware that the ulcer was present upon presentation today.  Originally presented for routine footcare.   Past Medical History:  Diagnosis Date   Arthritis    "knees" (01/01/2016)   Cataract    OS   CHF (congestive heart failure) (HCC)    Chronic kidney disease    Diabetes mellitus (HCC)    Diabetic retinopathy (HCC)    NPDR OU   Difficult intubation    Erectile dysfunction    GERD (gastroesophageal reflux disease)    Glaucoma    OS   HTN (hypertension)    Hyperlipidemia    Neuromuscular disorder (HCC)    neuropathy   Obesity    OSA on CPAP    Pain of left upper arm    Restrictive lung disease    Sensory hearing loss, bilateral    Sleep apnea    SOBOE (shortness of breath on exertion) 11/03/2015   currently goes to gym   Type II diabetes mellitus (HCC)     Past Surgical History:  Procedure Laterality Date   AMPUTATION TOE Right 11/11/2022   Procedure: RIGHT AMPUTATION  of  GREAT TOE;  Surgeon: Edwin Cap, DPM;  Location: [facility] OR;  Service: Podiatry;  Laterality: Right;   AMPUTATION TOE Right 02/19/2023   Procedure: SECOND TOE AMPUTATION;  Surgeon: Candelaria Stagers, DPM;  Location: [facility] OR;  Service: Orthopedics/Podiatry;  Laterality: Right;   CATARACT EXTRACTION Right 03/23/2020   Dr. Chalmers Guest   COLONOSCOPY     EYE SURGERY Right 03/23/2020   Cat Sx - Dr. Chalmers Guest   GLAUCOMA SURGERY Bilateral    "laser"   JOINT REPLACEMENT Left    knee   LAPAROSCOPIC CHOLECYSTECTOMY  09/17/2018   TOTAL KNEE ARTHROPLASTY Left 11/09/2015   Procedure: LEFT TOTAL KNEE ARTHROPLASTY;  Surgeon: Ollen Gross, MD;  Location: [facility];  Service: Orthopedics;  Laterality: Left;     Allergies  Allergen Reactions   Nsaids Other (See Comments)    GI bleed Hx of stomach ulcer   Aspirin Other (See Comments)    Hx of stomach ulcer Pt tolerates 81mg  EC tablets QD     Objective/Physical Exam General: The patient is alert and oriented x3 in no acute distress.  Dermatology:  Wound #1 noted to the distal tip of the right third toe approximately 0.5 x 0.5 x 0.2 cm (LxWxD).   To the noted ulceration(s), there is no eschar. There is a moderate amount of slough, fibrin, and necrotic tissue noted. Granulation tissue and wound base is red. There is a minimal amount of serosanguineous drainage noted. There is no exposed bone muscle-tendon ligament or joint. There is no malodor. Periwound integrity is intact. Skin is warm, dry and supple bilateral lower extremities.  Vascular: Palpable pedal pulses bilaterally. No edema or erythema noted. Capillary refill within normal limits.  Neurological: Light touch and protective threshold diminished bilaterally.   Musculoskeletal Exam: Patient ambulatory.  Prior history of right great toe and second toe amputations  Assessment: 1.  Ulcer right third toe secondary to diabetes mellitus 2. diabetes mellitus w/ peripheral neuropathy 3.  PSxHx RT great toe and 2nd toe amputations  Plan of Care:  -Patient was evaluated.  X-rays reviewed.  Comprehensive diabetic foot exam performed today -Medically necessary excisional debridement including subcutaneous tissue was performed using a tissue nipper and a chisel blade. Excisional debridement of all the necrotic nonviable tissue down to healthy bleeding viable tissue was performed with post-debridement measurements same as pre-. -Prescription for gentamicin cream to apply daily over the wound with a light dressing -Prescription for doxycycline 100 mg 2 times daily #20 -Cultures were taken and sent to pathology for culture and sensitivities -Postsurgical shoe dispensed.  WBAT -Return to  clinic 2 weeks   Felecia Shelling, DPM [facility]  Dr. Felecia Shelling, DPM    [address] [place], [place] [address]                Office [phone]  Fax [phone]

## 2023-08-30 NOTE — Addendum Note (Signed)
Addended by: Maryruth Hancock D on: 08/30/2023 03:38 PM   Modules accepted: Orders

## 2023-09-12 ENCOUNTER — Other Ambulatory Visit: Payer: Self-pay | Admitting: Podiatry

## 2023-09-13 ENCOUNTER — Encounter: Payer: Self-pay | Admitting: Podiatry

## 2023-09-13 ENCOUNTER — Ambulatory Visit: Payer: Medicare PPO | Admitting: Podiatry

## 2023-09-13 DIAGNOSIS — L97512 Non-pressure chronic ulcer of other part of right foot with fat layer exposed: Secondary | ICD-10-CM

## 2023-09-13 NOTE — Progress Notes (Signed)
Chief Complaint  Patient presents with   Foot Ulcer    RM#9 Follow up right third toe foot ulcer skin is soft and raw.    Subjective:  78 y.o. male with PMHx of diabetes mellitus and prior toe amputations to the great toe and second digit of the right foot presenting today for follow-up evaluation of an ulcer to the distal tip of the right third toe.  Patient's wife has been applying dressings daily to the toe.  They present for further treatment and evaluation   Past Medical History:  Diagnosis Date   Arthritis    "knees" (01/01/2016)   Cataract    OS   CHF (congestive heart failure) (HCC)    Chronic kidney disease    Diabetes mellitus (HCC)    Diabetic retinopathy (HCC)    NPDR OU   Difficult intubation    Erectile dysfunction    GERD (gastroesophageal reflux disease)    Glaucoma    OS   HTN (hypertension)    Hyperlipidemia    Neuromuscular disorder (HCC)    neuropathy   Obesity    OSA on CPAP    Pain of left upper arm    Restrictive lung disease    Sensory hearing loss, bilateral    Sleep apnea    SOBOE (shortness of breath on exertion) 11/03/2015   currently goes to gym   Type II diabetes mellitus (HCC)     Past Surgical History:  Procedure Laterality Date   AMPUTATION TOE Right 11/11/2022   Procedure: RIGHT AMPUTATION  of  GREAT TOE;  Surgeon: Edwin Cap, DPM;  Location: [facility];  Service: Podiatry;  Laterality: Right;   AMPUTATION TOE Right 02/19/2023   Procedure: SECOND TOE AMPUTATION;  Surgeon: Candelaria Stagers, DPM;  Location: [facility] OR;  Service: Orthopedics/Podiatry;  Laterality: Right;   CATARACT EXTRACTION Right 03/23/2020   Dr. Chalmers Guest   COLONOSCOPY     EYE SURGERY Right 03/23/2020   Cat Sx - Dr. Chalmers Guest   GLAUCOMA SURGERY Bilateral    "laser"   JOINT REPLACEMENT Left    knee   LAPAROSCOPIC CHOLECYSTECTOMY  09/17/2018   TOTAL KNEE ARTHROPLASTY Left 11/09/2015   Procedure: LEFT TOTAL KNEE ARTHROPLASTY;  Surgeon: Ollen Gross, MD;   Location: [facility];  Service: Orthopedics;  Laterality: Left;    Allergies  Allergen Reactions   Nsaids Other (See Comments)    GI bleed Hx of stomach ulcer   Aspirin Other (See Comments)    Hx of stomach ulcer Pt tolerates 81mg  EC tablets QD     Objective/Physical Exam General: The patient is alert and oriented x3 in no acute distress.  Dermatology:  Wound #1 noted to the distal tip of the right third toe approximately essentially encompassing the distal tip of the toe measuring approximately 1.5 x 1.5 x 0.3 cm (LxWxD).   To the noted ulceration(s), there is no eschar. There is a moderate amount of slough, fibrin, and necrotic tissue noted. Granulation tissue and wound base is red. There is a minimal amount of serosanguineous drainage noted. There is no exposed bone muscle-tendon ligament or joint. There is no malodor. Periwound integrity is intact.  Minimal bleeding with debridement Skin is warm, dry and supple bilateral lower extremities.  Vascular: Palpable pedal pulses bilaterally. No edema or erythema noted. Capillary refill within normal limits. VAS [place] ABI WITH/WO TBI 11/11/2022 ABI Findings:  +--------+------------------+-----+---------+--------+  Right  Rt Pressure (mmHg)IndexWaveform Comment   +--------+------------------+-----+---------+--------+  [id number]  triphasic          +--------+------------------+-----+---------+--------+  PTA    154               1.11 triphasic          +--------+------------------+-----+---------+--------+  DP     146               1.05 biphasic           +--------+------------------+-----+---------+--------+   +--------+------------------+-----+---------+-------+  Left   Lt Pressure (mmHg)IndexWaveform Comment  +--------+------------------+-----+---------+-------+  [id number]                   triphasic         +--------+------------------+-----+---------+-------+  PTA    156                1.12 triphasic         +--------+------------------+-----+---------+-------+  DP     148               1.06 triphasic         +--------+------------------+-----+---------+-------+  Right ABIs appear essentially unchanged compared to prior study on  03-10-2022. Left ABIs appear essentially unchanged compared to prior study  on 03-10-2022.  Summary:  Right: Resting right ankle-brachial index is within normal range.  Left: Resting left ankle-brachial index is within normal range.   Neurological: Light touch and protective threshold diminished bilaterally.   Musculoskeletal Exam: Patient ambulatory.  Prior history of right great toe and second toe amputations  Assessment: 1.  Ulcer right third toe secondary to diabetes mellitus 2. diabetes mellitus w/ peripheral neuropathy 3.  PSxHx RT great toe and 2nd toe amputations   Plan of Care:  -Patient was evaluated.   -Medically necessary excisional debridement including subcutaneous tissue was performed today using a tissue nipper.  Excisional debridement of the necrotic nonviable tissue down to healthier tissue was performed with postoperative measurement same as pre- -Discontinue gentamicin cream.  For now recommend Betadine application daily with a loose dressing.  Dressing supplies provided -WBAT surgical shoe -Return to clinic 3 weeks  Felecia Shelling, DPM [facility]  Dr. Felecia Shelling, DPM    [address] [place], [place] [address]                Office [phone]  Fax [phone]

## 2023-10-04 ENCOUNTER — Encounter: Payer: Self-pay | Admitting: Podiatry

## 2023-10-04 ENCOUNTER — Ambulatory Visit: Payer: Medicare PPO | Admitting: Podiatry

## 2023-10-04 ENCOUNTER — Ambulatory Visit (INDEPENDENT_AMBULATORY_CARE_PROVIDER_SITE_OTHER)

## 2023-10-04 DIAGNOSIS — M79671 Pain in right foot: Secondary | ICD-10-CM

## 2023-10-04 DIAGNOSIS — M869 Osteomyelitis, unspecified: Secondary | ICD-10-CM | POA: Diagnosis not present

## 2023-10-04 MED ORDER — DOXYCYCLINE HYCLATE 100 MG PO TABS: 100.0000 mg | ORAL_TABLET | Freq: Two times a day (BID) | ORAL | 0 refills | Status: DC

## 2023-10-04 NOTE — Progress Notes (Signed)
 Chief Complaint  Patient presents with   Follow-up    Patient states that he has had a lot of swelling and still leaking fuild and turning dark and hard on his right 3rd toe. Patient also has a [place] under his left foot hallux     Subjective:  78 y.o. male with PMHx of diabetes mellitus and prior toe amputations to the great toe and second digit of the right foot presenting today for follow-up evaluation of an ulcer to the distal tip of the right third toe.  Despite continued wound care by his spouse and minimal WBAT in the surgical shoe the toe continues to progressively deteriorate.  They have noticed increased swelling and discoloration to the toe.   Past Medical History:  Diagnosis Date   Arthritis    "knees" (01/01/2016)   Cataract    OS   CHF (congestive heart failure) (HCC)    Chronic kidney disease    Diabetes mellitus (HCC)    Diabetic retinopathy (HCC)    NPDR OU   Difficult intubation    Erectile dysfunction    GERD (gastroesophageal reflux disease)    Glaucoma    OS   HTN (hypertension)    Hyperlipidemia    Neuromuscular disorder (HCC)    neuropathy   Obesity    OSA on CPAP    Pain of left upper arm    Restrictive lung disease    Sensory hearing loss, bilateral    Sleep apnea    SOBOE (shortness of breath on exertion) 11/03/2015   currently goes to gym   Type II diabetes mellitus (HCC)     Past Surgical History:  Procedure Laterality Date   AMPUTATION TOE Right 11/11/2022   Procedure: RIGHT AMPUTATION  of  GREAT TOE;  Surgeon: Edwin Cap, DPM;  [facility];  Service: Podiatry;  Laterality: Right;   AMPUTATION TOE Right 02/19/2023   Procedure: SECOND TOE AMPUTATION;  Surgeon: Candelaria Stagers, DPM;  Location: [facility];  Service: Orthopedics/Podiatry;  Laterality: Right;   CATARACT EXTRACTION Right 03/23/2020   Dr. Chalmers Guest   COLONOSCOPY     EYE SURGERY Right 03/23/2020   Cat Sx - Dr. Chalmers Guest   GLAUCOMA SURGERY Bilateral    "laser"    JOINT REPLACEMENT Left    knee   LAPAROSCOPIC CHOLECYSTECTOMY  09/17/2018   TOTAL KNEE ARTHROPLASTY Left 11/09/2015   Procedure: LEFT TOTAL KNEE ARTHROPLASTY;  Surgeon: Ollen Gross, MD;  Location: [facility];  Service: Orthopedics;  Laterality: Left;    Allergies  Allergen Reactions   Nsaids Other (See Comments)    GI bleed Hx of stomach ulcer   Aspirin Other (See Comments)    Hx of stomach ulcer Pt tolerates 81mg  EC tablets QD    RT third toe 10/04/2023  RT third toe 10/04/2023  Objective/Physical Exam General: The patient is alert and oriented x3 in no acute distress.  Dermatology:  Mostly unchanged; possibly progressed.  Wound #1 noted to the distal tip of the right third toe approximately essentially encompassing the distal tip of the toe measuring approximately 1.5 x 1.5 x 0.3 cm (LxWxD).   To the noted ulceration(s), there is no eschar. There is a moderate amount of slough, fibrin, and necrotic tissue noted. Granulation tissue and wound base is red. There is a minimal amount of serosanguineous drainage noted. There is no exposed bone muscle-tendon ligament or joint. There is no malodor. Periwound integrity is intact.  Minimal bleeding with debridement Skin  is warm, dry and supple bilateral lower extremities.  Vascular: Palpable pedal pulses bilaterally. No edema or erythema noted. Capillary refill within normal limits. VAS [place] ABI WITH/WO TBI 11/11/2022 ABI Findings:  +--------+------------------+-----+---------+--------+  Right  Rt Pressure (mmHg)IndexWaveform Comment   +--------+------------------+-----+---------+--------+  [id number]                   triphasic          +--------+------------------+-----+---------+--------+  PTA    154               1.11 triphasic          +--------+------------------+-----+---------+--------+  DP     146               1.05 biphasic           +--------+------------------+-----+---------+--------+    +--------+------------------+-----+---------+-------+  Left   Lt Pressure (mmHg)IndexWaveform Comment  +--------+------------------+-----+---------+-------+  [id number]                   triphasic         +--------+------------------+-----+---------+-------+  PTA    156               1.12 triphasic         +--------+------------------+-----+---------+-------+  DP     148               1.06 triphasic         +--------+------------------+-----+---------+-------+  Right ABIs appear essentially unchanged compared to prior study on  03-10-2022. Left ABIs appear essentially unchanged compared to prior study  on 03-10-2022.  Summary:  Right: Resting right ankle-brachial index is within normal range.  Left: Resting left ankle-brachial index is within normal range.   Neurological: Light touch and protective threshold diminished bilaterally.   Musculoskeletal Exam: Patient ambulatory.  Prior history of right great toe and second toe amputations  Radiographic exam RT foot 08/30/2023: There does appear to be some osseous erosion noted to the distal tip of the distal phalanx right third toe discerning for underlying osteomyelitis.  Radiographic exam RT foot 10/04/2023: There appears to be slightly progressive osseous erosion to the distal phalanx of the third toe.  No other significant findings or changes compared to prior x-rays  Assessment: 1.  Ulcer right third toe secondary to diabetes mellitus 2. diabetes mellitus w/ peripheral neuropathy 3.  PSxHx RT great toe and 2nd toe amputations 4.  Osteomyelitis distal phalanx right third toe   Plan of Care:  -Patient was evaluated.  X-rays reviewed today and compared to prior x-rays taken - Unfortunately due to the progressive nature of the ulcer I do believe that amputation of the toes is appropriate at this time.  The toe continues to deteriorate despite conservative care.  There is also question of underlying  osteomyelitis.  This was discussed in detail with the patient and spouse who was present.  The patient has demonstrated good healing after amputation toe surgery. -After discussion with the patient he is amenable to proceed with amputation of the right third toe.  Risk benefits advantages disadvantages the procedure were explained in detail to the patient.  No guarantees were expressed or implied -Authorization for surgery was initiated today.  Surgery will consist of right third toe amputation -In the meantime prescription for doxycycline 100 mg twice daily x 10 days -Return to clinic 1 week postop  Felecia Shelling, DPM [facility]  Dr. Felecia Shelling, DPM    [address]  [address], [place] [address]                Office [phone]  Fax [phone]

## 2023-10-05 ENCOUNTER — Telehealth: Payer: Self-pay | Admitting: Podiatry

## 2023-10-05 NOTE — Telephone Encounter (Signed)
 DOS: 10/19/23 AMPUTATION MPJ 3RD RIGHT/ [id number]  [employer] 08/02/2019   PER THE [employer]BSITE PRIOR AUTH IS NOT REQ FOR CPT CODE 19147  [facility] # [id number]

## 2023-10-19 ENCOUNTER — Other Ambulatory Visit: Payer: Self-pay | Admitting: Podiatry

## 2023-10-19 DIAGNOSIS — M86671 Other chronic osteomyelitis, right ankle and foot: Secondary | ICD-10-CM | POA: Diagnosis not present

## 2023-10-19 MED ORDER — SULFAMETHOXAZOLE-TRIMETHOPRIM 800-160 MG PO TABS: 1.0000 | ORAL_TABLET | Freq: Two times a day (BID) | ORAL | 0 refills | Status: DC

## 2023-10-19 NOTE — Progress Notes (Signed)
 S/p amputation RT third toe

## 2023-10-25 ENCOUNTER — Ambulatory Visit (INDEPENDENT_AMBULATORY_CARE_PROVIDER_SITE_OTHER)

## 2023-10-25 ENCOUNTER — Ambulatory Visit (INDEPENDENT_AMBULATORY_CARE_PROVIDER_SITE_OTHER): Admitting: Podiatry

## 2023-10-25 ENCOUNTER — Encounter: Payer: Self-pay | Admitting: Podiatry

## 2023-10-25 DIAGNOSIS — M869 Osteomyelitis, unspecified: Secondary | ICD-10-CM | POA: Diagnosis not present

## 2023-10-25 DIAGNOSIS — M86171 Other acute osteomyelitis, right ankle and foot: Secondary | ICD-10-CM | POA: Diagnosis not present

## 2023-10-25 NOTE — Progress Notes (Signed)
 Chief Complaint  Patient presents with   Foot Ulcer    Rm 10: POV # 1 DOS 10/19/23 --- AMPUTATION RIGHT 3RD TOE. Pt is experiencing post op swelling. No drainage noted. Sutures intact. No signs of infection noted. Pt is in good spirits.    Subjective:  Patient presents today status post amputation of the right third toe.  DOS: 10/19/2023.  Patient doing well.  WBAT surgical shoe.  Dressings are clean dry and intact  Past Medical History:  Diagnosis Date   Arthritis    "knees" (01/01/2016)   Cataract    OS   CHF (congestive heart failure) (HCC)    Chronic kidney disease    Diabetes mellitus (HCC)    Diabetic retinopathy (HCC)    NPDR OU   Difficult intubation    Erectile dysfunction    GERD (gastroesophageal reflux disease)    Glaucoma    OS   HTN (hypertension)    Hyperlipidemia    Neuromuscular disorder (HCC)    neuropathy   Obesity    OSA on CPAP    Pain of left upper arm    Restrictive lung disease    Sensory hearing loss, bilateral    Sleep apnea    SOBOE (shortness of breath on exertion) 11/03/2015   currently goes to gym   Type II diabetes mellitus (HCC)     Past Surgical History:  Procedure Laterality Date   AMPUTATION TOE Right 11/11/2022   Procedure: RIGHT AMPUTATION  of  GREAT TOE;  Surgeon: Edwin Cap, DPM;  [facility];  Service: Podiatry;  Laterality: Right;   AMPUTATION TOE Right 02/19/2023   Procedure: SECOND TOE AMPUTATION;  Surgeon: Candelaria Stagers, DPM;  [facility];  Service: Orthopedics/Podiatry;  Laterality: Right;   CATARACT EXTRACTION Right 03/23/2020   Dr. Chalmers Guest   COLONOSCOPY     EYE SURGERY Right 03/23/2020   Cat Sx - Dr. Chalmers Guest   GLAUCOMA SURGERY Bilateral    "laser"   JOINT REPLACEMENT Left    knee   LAPAROSCOPIC CHOLECYSTECTOMY  09/17/2018   TOTAL KNEE ARTHROPLASTY Left 11/09/2015   Procedure: LEFT TOTAL KNEE ARTHROPLASTY;  Surgeon: Ollen Gross, MD;  Location: [facility];  Service: Orthopedics;  Laterality:  Left;    Allergies  Allergen Reactions   Nsaids Other (See Comments)    GI bleed Hx of stomach ulcer   Aspirin Other (See Comments)    Hx of stomach ulcer Pt tolerates 81mg  EC tablets QD    Objective/Physical Exam Neurovascular status intact.  Incision well coapted with sutures intact. No sign of infectious process noted. No dehiscence. No active bleeding noted.  Moderate edema noted to the surgical extremity.  Radiographic Exam RT foot 10/25/2023:  Interval amputation of the right third toe.  Assessment: 1. s/p amputation right third toe. DOS: 10/19/2023   Plan of Care:  -Patient was evaluated. X-rays reviewed - Dressings changed.  Leave clean dry and intact for 1 week -Continue WBAT surgical shoe -Return to clinic 1 week   Felecia Shelling, DPM [facility]  Dr. Felecia Shelling, DPM    [address], [place] [address]                Office [phone]  Fax [phone]

## 2023-10-27 ENCOUNTER — Telehealth: Payer: Self-pay | Admitting: Podiatry

## 2023-10-27 NOTE — Telephone Encounter (Signed)
 Patient called stating he is post op and concerned about his site having some bleeding. Patient states it looks like some betadine and dried blood. Wasn't sure what he should do?

## 2023-11-01 ENCOUNTER — Other Ambulatory Visit: Payer: Self-pay | Admitting: Podiatry

## 2023-11-01 ENCOUNTER — Ambulatory Visit: Payer: Medicare PPO | Admitting: Podiatry

## 2023-11-01 ENCOUNTER — Ambulatory Visit (INDEPENDENT_AMBULATORY_CARE_PROVIDER_SITE_OTHER): Admitting: Podiatry

## 2023-11-01 ENCOUNTER — Encounter: Payer: Self-pay | Admitting: Podiatry

## 2023-11-01 DIAGNOSIS — L97512 Non-pressure chronic ulcer of other part of right foot with fat layer exposed: Secondary | ICD-10-CM

## 2023-11-01 DIAGNOSIS — Z89421 Acquired absence of other right toe(s): Secondary | ICD-10-CM | POA: Diagnosis not present

## 2023-11-01 MED ORDER — AMOXICILLIN-POT CLAVULANATE 875-125 MG PO TABS: 1.0000 | ORAL_TABLET | Freq: Two times a day (BID) | ORAL | 0 refills | Status: DC

## 2023-11-02 NOTE — Progress Notes (Signed)
 Chief Complaint  Patient presents with   Routine Post Op    RFC POV # 2 DOS 10/19/23 --- AMPUTATION RIGHT 3RD TOE    Subjective:  Patient presents today status post amputation of the right third toe.  DOS: 10/19/2023.  Patient doing well.  WBAT surgical shoe.  Over the past week the patient and spouse have noticed drainage through the dressing.  Past Medical History:  Diagnosis Date   Arthritis    "knees" (01/01/2016)   Cataract    OS   CHF (congestive heart failure) (HCC)    Chronic kidney disease    Diabetes mellitus (HCC)    Diabetic retinopathy (HCC)    NPDR OU   Difficult intubation    Erectile dysfunction    GERD (gastroesophageal reflux disease)    Glaucoma    OS   HTN (hypertension)    Hyperlipidemia    Neuromuscular disorder (HCC)    neuropathy   Obesity    OSA on CPAP    Pain of left upper arm    Restrictive lung disease    Sensory hearing loss, bilateral    Sleep apnea    SOBOE (shortness of breath on exertion) 11/03/2015   currently goes to gym   Type II diabetes mellitus (HCC)     Past Surgical History:  Procedure Laterality Date   AMPUTATION TOE Right 11/11/2022   Procedure: RIGHT AMPUTATION  of  GREAT TOE;  Surgeon: Edwin Cap, DPM;  [facility];  Service: Podiatry;  Laterality: Right;   AMPUTATION TOE Right 02/19/2023   Procedure: SECOND TOE AMPUTATION;  Surgeon: Candelaria Stagers, DPM;  [facility];  Service: Orthopedics/Podiatry;  Laterality: Right;   CATARACT EXTRACTION Right 03/23/2020   Dr. Chalmers Guest   COLONOSCOPY     EYE SURGERY Right 03/23/2020   Cat Sx - Dr. Chalmers Guest   GLAUCOMA SURGERY Bilateral    "laser"   JOINT REPLACEMENT Left    knee   LAPAROSCOPIC CHOLECYSTECTOMY  09/17/2018   TOTAL KNEE ARTHROPLASTY Left 11/09/2015   Procedure: LEFT TOTAL KNEE ARTHROPLASTY;  Surgeon: Ollen Gross, MD;  Location: [facility];  Service: Orthopedics;  Laterality: Left;    Allergies  Allergen Reactions   Nsaids Other (See Comments)     GI bleed Hx of stomach ulcer   Aspirin Other (See Comments)    Hx of stomach ulcer Pt tolerates 81mg  EC tablets QD    Objective/Physical Exam Neurovascular status intact.  Incision well coapted with sutures intact.  Today there is dehiscence of the amputation site.  Mild serous drainage noted.  No purulence.  No malodor.  Granular wound base.  There does not appear to be any exposed bone  Radiographic Exam RT foot 10/25/2023:  Interval amputation of the right third toe.  Assessment: 1. s/p amputation right third toe. DOS: 10/19/2023   Plan of Care:  -Patient was evaluated.  -Cultures taken and sent to pathology for culture and sensitivity -Sutures removed since they were no longer holding the incision site together -Supplies provided to apply Hydrofera Blue with gauze dressing to the foot daily -Prescription for Augmentin 875/125 mg twice daily x 10 days -Continue minimal WBAT surgical shoe -Return to clinic 1 week  Felecia Shelling, DPM [facility]  Dr. Felecia Shelling, DPM    [address]  [place], [place] [address]                Office [phone]  Fax [phone]

## 2023-11-05 LAB — WOUND CULTURE
MICRO NUMBER:: 16280091
SPECIMEN QUALITY:: ADEQUATE

## 2023-11-05 LAB — HOUSE ACCOUNT TRACKING

## 2023-11-08 ENCOUNTER — Ambulatory Visit (INDEPENDENT_AMBULATORY_CARE_PROVIDER_SITE_OTHER): Admitting: Podiatry

## 2023-11-08 ENCOUNTER — Ambulatory Visit (INDEPENDENT_AMBULATORY_CARE_PROVIDER_SITE_OTHER)

## 2023-11-08 DIAGNOSIS — L97512 Non-pressure chronic ulcer of other part of right foot with fat layer exposed: Secondary | ICD-10-CM

## 2023-11-08 MED ORDER — CIPROFLOXACIN HCL 500 MG PO TABS: 500.0000 mg | ORAL_TABLET | Freq: Two times a day (BID) | ORAL | 0 refills | Status: AC

## 2023-11-08 MED ORDER — GENTAMICIN SULFATE 0.1 % EX CREA: 1.0000 | TOPICAL_CREAM | Freq: Two times a day (BID) | CUTANEOUS | 1 refills | Status: AC

## 2023-11-08 NOTE — Progress Notes (Signed)
   Chief Complaint  Patient presents with   Foot Ulcer    RM# 10 Right foot ulcer follow up patient concerned with the dryness of the foot.    Subjective:  Patient presents today status post amputation of the right third toe.  DOS: 10/19/2023.  Patient doing well.  WBAT surgical shoe.  They have been applying hydrofera blue and dressings daily.   Past Medical History:  Diagnosis Date   Arthritis    "knees" (01/01/2016)   Cataract    OS   CHF (congestive heart failure) (HCC)    Chronic kidney disease    Diabetes mellitus (HCC)    Diabetic retinopathy (HCC)    NPDR OU   Difficult intubation    Erectile dysfunction    GERD (gastroesophageal reflux disease)    Glaucoma    OS   HTN (hypertension)    Hyperlipidemia    Neuromuscular disorder (HCC)    neuropathy   Obesity    OSA on CPAP    Pain of left upper arm    Restrictive lung disease    Sensory hearing loss, bilateral    Sleep apnea    SOBOE (shortness of breath on exertion) 11/03/2015   currently goes to gym   Type II diabetes mellitus (HCC)     Past Surgical History:  Procedure Laterality Date   AMPUTATION TOE Right 11/11/2022   Procedure: RIGHT AMPUTATION  of  GREAT TOE;  Surgeon: Edwin Cap, DPM;  [facility];  Service: Podiatry;  Laterality: Right;   AMPUTATION TOE Right 02/19/2023   Procedure: SECOND TOE AMPUTATION;  Surgeon: Candelaria Stagers, DPM;  [facility];  Service: Orthopedics/Podiatry;  Laterality: Right;   CATARACT EXTRACTION Right 03/23/2020   Dr. Chalmers Guest   COLONOSCOPY     EYE SURGERY Right 03/23/2020   Cat Sx - Dr. Chalmers Guest   GLAUCOMA SURGERY Bilateral    "laser"   JOINT REPLACEMENT Left    knee   LAPAROSCOPIC CHOLECYSTECTOMY  09/17/2018   TOTAL KNEE ARTHROPLASTY Left 11/09/2015   Procedure: LEFT TOTAL KNEE ARTHROPLASTY;  Surgeon: Ollen Gross, MD;  Location: [facility];  Service: Orthopedics;  Laterality: Left;    Allergies  Allergen Reactions   Nsaids Other (See Comments)     GI bleed Hx of stomach ulcer   Aspirin Other (See Comments)    Hx of stomach ulcer Pt tolerates 81mg  EC tablets QD    Objective/Physical Exam Neurovascular status intact. Dehiscence noted to the amputation site. Granular wound base. No malodor. No exposed bone. Scant serous drainage. No purulence.   Radiographic Exam RT foot 11/08/2023:  Interval amputation of the right third toe.  Assessment: 1. s/p amputation right third toe. DOS: 10/19/2023   Plan of Care:  -Patient was evaluated. Cultures reviewed -Rx Gentamicin cream based on cultures -Rx Cipro 500mg  BID x 10 days based on cultures -Cont WBAT surgical shoe -RTC 2 weeks  Felecia Shelling, DPM [facility]  Dr. Felecia Shelling, DPM    [address] [place], [place] [address]                Office [phone]  Fax [phone]

## 2023-11-20 ENCOUNTER — Ambulatory Visit (INDEPENDENT_AMBULATORY_CARE_PROVIDER_SITE_OTHER): Admitting: Podiatry

## 2023-11-20 ENCOUNTER — Encounter: Payer: Self-pay | Admitting: Podiatry

## 2023-11-20 DIAGNOSIS — L97512 Non-pressure chronic ulcer of other part of right foot with fat layer exposed: Secondary | ICD-10-CM

## 2023-11-20 NOTE — Progress Notes (Signed)
   No chief complaint on file.   Subjective:  Patient presents today status post amputation of the right third toe.  DOS: 10/19/2023.  Patient is doing very well.  They have no significant improvement.  They are applying the gentamicin  cream as instructed and he completed the oral ciprofloxacin .  Past Medical History:  Diagnosis Date   Arthritis    "knees" (01/01/2016)   Cataract    OS   CHF (congestive heart failure) (HCC)    Chronic kidney disease    Diabetes mellitus (HCC)    Diabetic retinopathy (HCC)    NPDR OU   Difficult intubation    Erectile dysfunction    GERD (gastroesophageal reflux disease)    Glaucoma    OS   HTN (hypertension)    Hyperlipidemia    Neuromuscular disorder (HCC)    neuropathy   Obesity    OSA on CPAP    Pain of left upper arm    Restrictive lung disease    Sensory hearing loss, bilateral    Sleep apnea    SOBOE (shortness of breath on exertion) 11/03/2015   currently goes to gym   Type II diabetes mellitus (HCC)     Past Surgical History:  Procedure Laterality Date   AMPUTATION TOE Right 11/11/2022   Procedure: RIGHT AMPUTATION  of  GREAT TOE;  Surgeon: Floyce Hutching, DPM;  [facility];  Service: Podiatry;  Laterality: Right;   AMPUTATION TOE Right 02/19/2023   Procedure: SECOND TOE AMPUTATION;  Surgeon: Velma Ghazi, DPM;  [facility];  Service: Orthopedics/Podiatry;  Laterality: Right;   CATARACT EXTRACTION Right 03/23/2020   Dr. Ben Bracken   COLONOSCOPY     EYE SURGERY Right 03/23/2020   Cat Sx - Dr. Ben Bracken   GLAUCOMA SURGERY Bilateral    "laser"   JOINT REPLACEMENT Left    knee   LAPAROSCOPIC CHOLECYSTECTOMY  09/17/2018   TOTAL KNEE ARTHROPLASTY Left 11/09/2015   Procedure: LEFT TOTAL KNEE ARTHROPLASTY;  Surgeon: Liliane Rei, MD;  Location: [facility];  Service: Orthopedics;  Laterality: Left;    Allergies  Allergen Reactions   Nsaids Other (See Comments)    GI bleed Hx of stomach ulcer   Aspirin  Other (See  Comments)    Hx of stomach ulcer Pt tolerates 81mg  EC tablets QD    Objective/Physical Exam Neurovascular status intact.  Ulcer noted to the amputation stump which appears superficial with a healthy granular wound base.  Scant serous drainage.  No malodor or concern clinically for acute infection  Radiographic Exam RT foot 11/08/2023:  Interval amputation of the right third toe.  Assessment: 1. s/p amputation right third toe. DOS: 10/19/2023 2.  Ulcer of the amputation stump right foot   Plan of Care:  -Patient was evaluated.  -Continue gentamicin  cream with a light dressing daily -Patient may now transition out of the postoperative shoe into his diabetic shoes -Return to clinic 3 weeks  Dot Gazella, DPM [facility]  Dr. Dot Gazella, DPM    [address] [place], [place] [address]                Office [phone]  Fax [phone]

## 2023-12-11 ENCOUNTER — Ambulatory Visit (INDEPENDENT_AMBULATORY_CARE_PROVIDER_SITE_OTHER)

## 2023-12-11 ENCOUNTER — Ambulatory Visit (INDEPENDENT_AMBULATORY_CARE_PROVIDER_SITE_OTHER): Admitting: Podiatry

## 2023-12-11 ENCOUNTER — Encounter: Payer: Self-pay | Admitting: Podiatry

## 2023-12-11 VITALS — Ht 69.0 in | Wt 286.0 lb

## 2023-12-11 DIAGNOSIS — M869 Osteomyelitis, unspecified: Secondary | ICD-10-CM

## 2023-12-11 DIAGNOSIS — M86171 Other acute osteomyelitis, right ankle and foot: Secondary | ICD-10-CM

## 2023-12-11 NOTE — Progress Notes (Unsigned)
 Chief Complaint  Patient presents with   Routine Post Op    POST OP - rm 7 POV # 4 DOS 10/19/23 --- AMPUTATION RIGHT 3RD TOE "It's doing pretty good.  My pain level is a zero."    Subjective:  Patient presents today status post amputation of the right third toe.  DOS: 10/19/2023.  Patient is doing very well.  They have no significant improvement.  They are applying the gentamicin  cream as instructed and he completed the oral ciprofloxacin .  Past Medical History:  Diagnosis Date   Arthritis    "knees" (01/01/2016)   Cataract    OS   CHF (congestive heart failure) (HCC)    Chronic kidney disease    Diabetes mellitus (HCC)    Diabetic retinopathy (HCC)    NPDR OU   Difficult intubation    Erectile dysfunction    GERD (gastroesophageal reflux disease)    Glaucoma    OS   HTN (hypertension)    Hyperlipidemia    Neuromuscular disorder (HCC)    neuropathy   Obesity    OSA on CPAP    Pain of left upper arm    Restrictive lung disease    Sensory hearing loss, bilateral    Sleep apnea    SOBOE (shortness of breath on exertion) 11/03/2015   currently goes to gym   Type II diabetes mellitus (HCC)     Past Surgical History:  Procedure Laterality Date   AMPUTATION TOE Right 11/11/2022   Procedure: RIGHT AMPUTATION  of  GREAT TOE;  Surgeon: Floyce Hutching, DPM;  [facility];  Service: Podiatry;  Laterality: Right;   AMPUTATION TOE Right 02/19/2023   Procedure: SECOND TOE AMPUTATION;  Surgeon: Velma Ghazi, DPM;  [facility]: [facility];  Service: Orthopedics/Podiatry;  Laterality: Right;   CATARACT EXTRACTION Right 03/23/2020   Dr. Ben Bracken   COLONOSCOPY     EYE SURGERY Right 03/23/2020   Cat Sx - Dr. Ben Bracken   GLAUCOMA SURGERY Bilateral    "laser"   JOINT REPLACEMENT Left    knee   LAPAROSCOPIC CHOLECYSTECTOMY  09/17/2018   TOTAL KNEE ARTHROPLASTY Left 11/09/2015   Procedure: LEFT TOTAL KNEE ARTHROPLASTY;  Surgeon: Liliane Rei, MD;  Location: [facility];  Service:  Orthopedics;  Laterality: Left;    Allergies  Allergen Reactions   Nsaids Other (See Comments)    GI bleed Hx of stomach ulcer   Aspirin  Other (See Comments)    Hx of stomach ulcer Pt tolerates 81mg  EC tablets QD    Objective/Physical Exam Neurovascular status intact.  Ulcer noted to the amputation stump which appears superficial with a healthy granular wound base.  Scant serous drainage.  No malodor or concern clinically for acute infection  Radiographic Exam RT foot 11/08/2023:  Interval amputation of the right third toe.  Assessment: 1. s/p amputation right third toe. DOS: 10/19/2023 2.  Ulcer of the amputation stump right foot   Plan of Care:  -Patient was evaluated.  -Continue gentamicin  cream with a light dressing daily -Patient may now transition out of the postoperative shoe into his diabetic shoes -Return to clinic 3 weeks  Dot Gazella, DPM [facility]  Dr. Dot Gazella, DPM    [address]                                    [place],  [place] [address]                Office [phone]  Fax [phone]

## 2024-01-01 ENCOUNTER — Ambulatory Visit: Admitting: Podiatry

## 2024-01-01 ENCOUNTER — Encounter: Payer: Self-pay | Admitting: Podiatry

## 2024-01-01 DIAGNOSIS — M79674 Pain in right toe(s): Secondary | ICD-10-CM

## 2024-01-01 DIAGNOSIS — M79675 Pain in left toe(s): Secondary | ICD-10-CM

## 2024-01-01 DIAGNOSIS — L97512 Non-pressure chronic ulcer of other part of right foot with fat layer exposed: Secondary | ICD-10-CM

## 2024-01-01 DIAGNOSIS — B351 Tinea unguium: Secondary | ICD-10-CM | POA: Diagnosis not present

## 2024-01-01 NOTE — Progress Notes (Signed)
 Chief Complaint  Patient presents with   Diabetic Ulcer    "I think it's doing pretty good."  Dr. Eleni Griffin - 12/13/2023; A1c - 8.4    Subjective:  Patient PMHx T2DM, last A1c 8.4 on 12/13/2023, presents today status post amputation of the right third toe.  DOS: 10/19/2023.  Continues to do well and they believe that the callused area is healed.  They have been applying gentamicin  cream with a light dressing daily.    Patient also requesting nail debridement today.  He says that the nails are very symptomatic and painful especially in close toed shoes.  Past Medical History:  Diagnosis Date   Arthritis    "knees" (01/01/2016)   Cataract    OS   CHF (congestive heart failure) (HCC)    Chronic kidney disease    Diabetes mellitus (HCC)    Diabetic retinopathy (HCC)    NPDR OU   Difficult intubation    Erectile dysfunction    GERD (gastroesophageal reflux disease)    Glaucoma    OS   HTN (hypertension)    Hyperlipidemia    Neuromuscular disorder (HCC)    neuropathy   Obesity    OSA on CPAP    Pain of left upper arm    Restrictive lung disease    Sensory hearing loss, bilateral    Sleep apnea    SOBOE (shortness of breath on exertion) 11/03/2015   currently goes to gym   Type II diabetes mellitus (HCC)     Past Surgical History:  Procedure Laterality Date   AMPUTATION TOE Right 11/11/2022   Procedure: RIGHT AMPUTATION  of  GREAT TOE;  Surgeon: Floyce Hutching, DPM;  Location: [facility];  Service: Podiatry;  Laterality: Right;   AMPUTATION TOE Right 02/19/2023   Procedure: SECOND TOE AMPUTATION;  Surgeon: Velma Ghazi, DPM;  Location: [facility];  Service: Orthopedics/Podiatry;  Laterality: Right;   CATARACT EXTRACTION Right 03/23/2020   Dr. Ben Bracken   COLONOSCOPY     EYE SURGERY Right 03/23/2020   Cat Sx - Dr. Ben Bracken   GLAUCOMA SURGERY Bilateral    "laser"   JOINT REPLACEMENT Left    knee   LAPAROSCOPIC CHOLECYSTECTOMY  09/17/2018   TOTAL KNEE ARTHROPLASTY Left  11/09/2015   Procedure: LEFT TOTAL KNEE ARTHROPLASTY;  Surgeon: Liliane Rei, MD;  Location: [facility];  Service: Orthopedics;  Laterality: Left;    Allergies  Allergen Reactions   Nsaids Other (See Comments)    GI bleed Hx of stomach ulcer   Aspirin  Other (See Comments)    Hx of stomach ulcer Pt tolerates 81mg  EC tablets QD    Objective/Physical Exam Neurovascular status intact.  The amputation site is completely healed and resolved.  No open wound noted.  There is some hyperkeratotic callus tissue overlying the freshly healed area which was debrided today Hyperkeratotic dystrophic elongated nails noted 1-5 bilateral  Radiographic Exam RT foot 12/11/2023:  No change compared to prior x-rays.  Stable.  Interval amputation of the right third toe.  Assessment: 1. s/p amputation right third toe. DOS: 10/19/2023 2.  Ulcer of the amputation stump right foot; healed 3.  Pain due to onychomycosis of toenails both  Plan of Care:  -Patient was evaluated.  Ankle debridement of nails 1-5 bilateral performed using a nail nipper without incident or bleeding -Light excisional debridement of the callus tissue over the freshly healed wound was performed today.  No bleeding.  The surgical site is healed -Recommend  daily foot lotion - Diabetic shoes -Return to clinic 3 months routine footcare with Dr. Corbin Dess, DPM [facility]  Dr. Dot Gazella, DPM    [address] [place], [place] [address]                Office [phone]  Fax [phone]

## 2024-04-03 ENCOUNTER — Ambulatory Visit: Admitting: Podiatry

## 2024-04-03 ENCOUNTER — Encounter: Payer: Self-pay | Admitting: Podiatry

## 2024-04-03 VITALS — Ht 69.0 in | Wt 286.0 lb

## 2024-04-03 DIAGNOSIS — M79674 Pain in right toe(s): Secondary | ICD-10-CM | POA: Diagnosis not present

## 2024-04-03 DIAGNOSIS — B351 Tinea unguium: Secondary | ICD-10-CM

## 2024-04-03 DIAGNOSIS — M79675 Pain in left toe(s): Secondary | ICD-10-CM

## 2024-04-03 NOTE — Progress Notes (Signed)
   No chief complaint on file.   Subjective:  Patient PMHx T2DM, last A1c 8.4 on 12/13/2023, presenting today for routine footcare.  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  No new complaints today  Past Medical History:  Diagnosis Date   Arthritis    knees (01/01/2016)   Cataract    OS   CHF (congestive heart failure) (HCC)    Chronic kidney disease    Diabetes mellitus (HCC)    Diabetic retinopathy (HCC)    NPDR OU   Difficult intubation    Erectile dysfunction    GERD (gastroesophageal reflux disease)    Glaucoma    OS   HTN (hypertension)    Hyperlipidemia    Neuromuscular disorder (HCC)    neuropathy   Obesity    OSA on CPAP    Pain of left upper arm    Restrictive lung disease    Sensory hearing loss, bilateral    Sleep apnea    SOBOE (shortness of breath on exertion) 11/03/2015   currently goes to gym   Type II diabetes mellitus (HCC)     Past Surgical History:  Procedure Laterality Date   AMPUTATION TOE Right 11/11/2022   Procedure: RIGHT AMPUTATION  of  GREAT TOE;  Surgeon: Silva Juliene SAUNDERS, DPM;  Location: [facility];  Service: Podiatry;  Laterality: Right;   AMPUTATION TOE Right 02/19/2023   Procedure: SECOND TOE AMPUTATION;  Surgeon: Tobie Franky SQUIBB, DPM;  Location: [facility];  Service: Orthopedics/Podiatry;  Laterality: Right;   CATARACT EXTRACTION Right 03/23/2020   Dr. Gaither Quan   COLONOSCOPY     EYE SURGERY Right 03/23/2020   Cat Sx - Dr. Gaither Quan   GLAUCOMA SURGERY Bilateral    laser   JOINT REPLACEMENT Left    knee   LAPAROSCOPIC CHOLECYSTECTOMY  09/17/2018   TOTAL KNEE ARTHROPLASTY Left 11/09/2015   Procedure: LEFT TOTAL KNEE ARTHROPLASTY;  Surgeon: Dempsey Moan, MD;  Location: [facility];  Service: Orthopedics;  Laterality: Left;    Allergies  Allergen Reactions   Nsaids Other (See Comments)    GI bleed Hx of stomach ulcer   Aspirin  Other (See Comments)    Hx of stomach ulcer Pt tolerates 81mg  EC tablets QD     Objective/Physical Exam Pulses palpable.  Chronic lower extremity edema noted bilateral.  Hyperkeratotic dystrophic nails noted 1-5 bilateral with exception of the amputation of the digits 1-3 of the right foot which have healed   Assessment: 1. s/p amputation right digits 1-3  2.  Pain due to onychomycosis of toenails both  Plan of Care:  -Patient was evaluated.  -Mechanical debridement of nails 1-5 bilateral performed using a nail nipper without incident or bleeding -Continue diabetic shoes with custom molded Plastizote insoles -Return to clinic 3 months routine footcare  Thresa EMERSON Sar, DPM [facility]  Dr. Thresa EMERSON Sar, DPM    [address] [place], [place] [address]                Office [phone]  Fax [phone]

## 2024-06-06 ENCOUNTER — Encounter: Payer: Self-pay | Admitting: Podiatry

## 2024-06-06 ENCOUNTER — Ambulatory Visit (INDEPENDENT_AMBULATORY_CARE_PROVIDER_SITE_OTHER): Admitting: Podiatry

## 2024-06-06 DIAGNOSIS — Z89421 Acquired absence of other right toe(s): Secondary | ICD-10-CM | POA: Diagnosis not present

## 2024-06-06 DIAGNOSIS — M79675 Pain in left toe(s): Secondary | ICD-10-CM

## 2024-06-06 DIAGNOSIS — E1165 Type 2 diabetes mellitus with hyperglycemia: Secondary | ICD-10-CM

## 2024-06-06 DIAGNOSIS — B351 Tinea unguium: Secondary | ICD-10-CM

## 2024-06-06 DIAGNOSIS — M79674 Pain in right toe(s): Secondary | ICD-10-CM | POA: Diagnosis not present

## 2024-06-06 DIAGNOSIS — Z794 Long term (current) use of insulin: Secondary | ICD-10-CM

## 2024-06-06 NOTE — Progress Notes (Signed)
 Patient ID: Micheal Morris, male   DOB: Feb 15, 1946, 78 y.o.   MRN: [id number] Complaint:  Visit Type: Patient returns to my office for continued preventative foot care services. Complaint: Patient states my nails have grown long and thick and become painful to walk and wear shoes Patient has been diagnosed with DM with  neuropathy.. The patient presents for preventative foot care services.    He presents to the office nail care.    Podiatric Exam: Vascular: dorsalis pedis and posterior tibial pulses are weakly  palpable /absent bilateral. Capillary return is immediate. Temperature gradient is WNL. Skin turgor WNL  Sensorium: Normal Semmes Weinstein monofilament test. Normal tactile sensation bilaterally. Nail Exam: Pt has thick disfigured discolored nails with subungual debris noted entire  hallux through fifth toenails left foot. Debride nails 4-5  right foot. Ulcer Exam: No evidence. Orthopedic Exam: Muscle tone and strength are WNL. No limitations in general ROM. No crepitus or effusions noted. Foot type and digits show no abnormalities. Bony prominences are unremarkable. Amputation 1,2  and 3 right foot. Skin: No Porokeratosis. No infection or ulcers.     Diagnosis:  Onychomycosis, , Pain in right toe, pain in left toes  Treatment & Plan Procedures and Treatment: Consent by patient was obtained for treatment procedures. The patient understood the discussion of treatment and procedures well. All questions were answered thoroughly reviewed. Debridement of mycotic and hypertrophic toenails, 1 through 5 left  and 3-5 right with clearing of subungual debris. Discussed his circulation with this patient and we decided to send him for vascular studies. Return Visit-Office Procedure: Patient instructed to return to the office for a follow up visit 9  weeks  for continued evaluation and treatment.  Cordella Bold DPM

## 2024-06-07 ENCOUNTER — Ambulatory Visit (HOSPITAL_COMMUNITY)
Admission: RE | Admit: 2024-06-07 | Discharge: 2024-06-07 | Disposition: A | Discharge status: Home / Self Care | Source: Ambulatory Visit | Attending: Podiatry | Admitting: Podiatry

## 2024-06-07 DIAGNOSIS — Z89421 Acquired absence of other right toe(s): Secondary | ICD-10-CM | POA: Diagnosis present

## 2024-06-07 DIAGNOSIS — E1165 Type 2 diabetes mellitus with hyperglycemia: Secondary | ICD-10-CM | POA: Insufficient documentation

## 2024-06-07 DIAGNOSIS — Z794 Long term (current) use of insulin: Secondary | ICD-10-CM | POA: Diagnosis present

## 2024-06-07 LAB — VAS US ABI WITH/WO TBI
Left ABI: 1.06
Right ABI: 1.05

## 2024-08-08 ENCOUNTER — Encounter: Payer: Self-pay | Admitting: Podiatry

## 2024-08-08 ENCOUNTER — Ambulatory Visit: Admitting: Podiatry

## 2024-08-08 DIAGNOSIS — Z89421 Acquired absence of other right toe(s): Secondary | ICD-10-CM

## 2024-08-08 DIAGNOSIS — M79674 Pain in right toe(s): Secondary | ICD-10-CM

## 2024-08-08 DIAGNOSIS — E1165 Type 2 diabetes mellitus with hyperglycemia: Secondary | ICD-10-CM

## 2024-08-08 DIAGNOSIS — B351 Tinea unguium: Secondary | ICD-10-CM | POA: Diagnosis not present

## 2024-08-08 DIAGNOSIS — M79675 Pain in left toe(s): Secondary | ICD-10-CM

## 2024-08-08 DIAGNOSIS — Z794 Long term (current) use of insulin: Secondary | ICD-10-CM

## 2024-08-08 NOTE — Progress Notes (Addendum)
 Patient ID: Micheal Morris, male   DOB: 04/23/1946, 79 y.o.   MRN: [id number] Complaint:  Visit Type: Patient returns to my office for continued preventative foot care services. Complaint: Patient states my nails have grown long and thick and become painful to walk and wear shoes Patient has been diagnosed with DM with  neuropathy.. The patient presents for preventative foot care services.    He presents to the office nail care.    Podiatric Exam: Vascular: dorsalis pedis and posterior tibial pulses are weakly  palpable /absent bilateral. Capillary return is immediate. Temperature gradient is WNL. Skin turgor WNL  Sensorium: Normal Semmes Weinstein monofilament test. Normal tactile sensation bilaterally. Nail Exam: Pt has thick disfigured discolored nails with subungual debris noted entire  hallux through fifth toenails left foot. Debride nails 4-5  right foot. Ulcer Exam: No evidence. Orthopedic Exam: Muscle tone and strength are WNL. No limitations in general ROM. No crepitus or effusions noted. Foot type and digits show no abnormalities. Bony prominences are unremarkable. Amputation 1,2  and 3 right foot.  Vascular studies were WNL. Skin: No Porokeratosis. No infection or ulcers.     Diagnosis:  Onychomycosis, , Pain in right toe, pain in left toes  Treatment & Plan Procedures and Treatment: Consent by patient was obtained for treatment procedures. The patient understood the discussion of treatment and procedures well. All questions were answered thoroughly reviewed. Debridement of mycotic and hypertrophic toenails, 1 through 5 left  and 3-5 right with clearing of subungual debris.  Return Visit-Office Procedure: Patient instructed to return to the office for a follow up visit 9  weeks  for continued evaluation and treatment.  Cordella Bold DPM

## 2024-10-11 ENCOUNTER — Ambulatory Visit: Admitting: Podiatry
